# Patient Record
Sex: Female | Born: 1960 | Race: White | Hispanic: No | Marital: Married | Smoking: Former smoker
Health system: Southern US, Community
[De-identification: ages and names within clinical notes are randomized; demographics above are authoritative.]

## PROBLEM LIST (undated history)

## (undated) DIAGNOSIS — R609 Edema, unspecified: Secondary | ICD-10-CM

## (undated) DIAGNOSIS — E7800 Pure hypercholesterolemia, unspecified: Secondary | ICD-10-CM

## (undated) DIAGNOSIS — R0602 Shortness of breath: Secondary | ICD-10-CM

## (undated) DIAGNOSIS — Z9189 Other specified personal risk factors, not elsewhere classified: Secondary | ICD-10-CM

## (undated) DIAGNOSIS — E2839 Other primary ovarian failure: Secondary | ICD-10-CM

## (undated) DIAGNOSIS — M8580 Other specified disorders of bone density and structure, unspecified site: Secondary | ICD-10-CM

## (undated) DIAGNOSIS — N939 Abnormal uterine and vaginal bleeding, unspecified: Secondary | ICD-10-CM

## (undated) DIAGNOSIS — K5900 Constipation, unspecified: Secondary | ICD-10-CM

## (undated) DIAGNOSIS — D6851 Activated protein C resistance: Secondary | ICD-10-CM

## (undated) DIAGNOSIS — R112 Nausea with vomiting, unspecified: Secondary | ICD-10-CM

## (undated) DIAGNOSIS — J42 Unspecified chronic bronchitis: Secondary | ICD-10-CM

## (undated) DIAGNOSIS — Z95810 Presence of automatic (implantable) cardiac defibrillator: Secondary | ICD-10-CM

## (undated) DIAGNOSIS — Z8759 Personal history of other complications of pregnancy, childbirth and the puerperium: Secondary | ICD-10-CM

## (undated) DIAGNOSIS — G4730 Sleep apnea, unspecified: Secondary | ICD-10-CM

## (undated) DIAGNOSIS — M1990 Unspecified osteoarthritis, unspecified site: Secondary | ICD-10-CM

## (undated) DIAGNOSIS — G629 Polyneuropathy, unspecified: Secondary | ICD-10-CM

## (undated) DIAGNOSIS — K0889 Other specified disorders of teeth and supporting structures: Secondary | ICD-10-CM

## (undated) DIAGNOSIS — K219 Gastro-esophageal reflux disease without esophagitis: Secondary | ICD-10-CM

## (undated) DIAGNOSIS — Z86711 Personal history of pulmonary embolism: Secondary | ICD-10-CM

## (undated) DIAGNOSIS — N840 Polyp of corpus uteri: Secondary | ICD-10-CM

## (undated) DIAGNOSIS — R5383 Other fatigue: Secondary | ICD-10-CM

## (undated) DIAGNOSIS — I10 Essential (primary) hypertension: Secondary | ICD-10-CM

## (undated) DIAGNOSIS — Z794 Long term (current) use of insulin: Secondary | ICD-10-CM

## (undated) DIAGNOSIS — Z6841 Body Mass Index (BMI) 40.0 and over, adult: Secondary | ICD-10-CM

## (undated) DIAGNOSIS — M7989 Other specified soft tissue disorders: Secondary | ICD-10-CM

## (undated) DIAGNOSIS — E039 Hypothyroidism, unspecified: Secondary | ICD-10-CM

## (undated) DIAGNOSIS — I8290 Acute embolism and thrombosis of unspecified vein: Secondary | ICD-10-CM

## (undated) DIAGNOSIS — I509 Heart failure, unspecified: Secondary | ICD-10-CM

## (undated) DIAGNOSIS — K5730 Diverticulosis of large intestine without perforation or abscess without bleeding: Secondary | ICD-10-CM

## (undated) DIAGNOSIS — I447 Left bundle-branch block, unspecified: Secondary | ICD-10-CM

## (undated) DIAGNOSIS — I428 Other cardiomyopathies: Secondary | ICD-10-CM

## (undated) DIAGNOSIS — Z86718 Personal history of other venous thrombosis and embolism: Secondary | ICD-10-CM

## (undated) DIAGNOSIS — E119 Type 2 diabetes mellitus without complications: Secondary | ICD-10-CM

## (undated) DIAGNOSIS — E785 Hyperlipidemia, unspecified: Secondary | ICD-10-CM

## (undated) DIAGNOSIS — Z9889 Other specified postprocedural states: Secondary | ICD-10-CM

## (undated) DIAGNOSIS — E559 Vitamin D deficiency, unspecified: Secondary | ICD-10-CM

## (undated) DIAGNOSIS — G4733 Obstructive sleep apnea (adult) (pediatric): Secondary | ICD-10-CM

## (undated) DIAGNOSIS — K529 Noninfective gastroenteritis and colitis, unspecified: Secondary | ICD-10-CM

## (undated) HISTORY — DX: Other specified soft tissue disorders: M79.89

## (undated) HISTORY — DX: Obstructive sleep apnea (adult) (pediatric): G47.33

## (undated) HISTORY — DX: Other fatigue: R53.83

## (undated) HISTORY — DX: Sleep apnea, unspecified: G47.30

## (undated) HISTORY — DX: Other primary ovarian failure: E28.39

## (undated) HISTORY — DX: Heart failure, unspecified: I50.9

## (undated) HISTORY — DX: Essential (primary) hypertension: I10

## (undated) HISTORY — DX: Noninfective gastroenteritis and colitis, unspecified: K52.9

## (undated) HISTORY — DX: Shortness of breath: R06.02

## (undated) HISTORY — DX: Morbid (severe) obesity due to excess calories: E66.01

## (undated) HISTORY — DX: Other specified disorders of teeth and supporting structures: K08.89

## (undated) HISTORY — DX: Other cardiomyopathies: I42.8

## (undated) HISTORY — DX: Edema, unspecified: R60.9

## (undated) HISTORY — DX: Personal history of pulmonary embolism: Z86.711

## (undated) HISTORY — PX: CHOLECYSTECTOMY: SHX55

## (undated) HISTORY — DX: Other specified disorders of bone density and structure, unspecified site: M85.80

## (undated) HISTORY — DX: Constipation, unspecified: K59.00

## (undated) HISTORY — DX: Activated protein C resistance: D68.51

## (undated) HISTORY — DX: Hyperlipidemia, unspecified: E78.5

## (undated) HISTORY — DX: Vitamin D deficiency, unspecified: E55.9

---

## 1996-10-15 DIAGNOSIS — Z8759 Personal history of other complications of pregnancy, childbirth and the puerperium: Secondary | ICD-10-CM

## 1996-10-15 HISTORY — DX: Personal history of other complications of pregnancy, childbirth and the puerperium: Z87.59

## 1998-09-28 ENCOUNTER — Ambulatory Visit (HOSPITAL_COMMUNITY): Admission: RE | Admit: 1998-09-28 | Discharge: 1998-09-28 | Payer: Self-pay | Admitting: Family Medicine

## 2007-12-04 ENCOUNTER — Other Ambulatory Visit: Admission: RE | Admit: 2007-12-04 | Discharge: 2007-12-04 | Payer: Self-pay | Admitting: Gynecology

## 2007-12-06 HISTORY — PX: OTHER SURGICAL HISTORY: SHX169

## 2007-12-06 HISTORY — PX: TUBAL LIGATION: SHX77

## 2007-12-06 HISTORY — PX: DILATION AND CURETTAGE OF UTERUS: SHX78

## 2008-03-04 ENCOUNTER — Encounter: Payer: Self-pay | Admitting: Gynecology

## 2008-03-04 ENCOUNTER — Ambulatory Visit (HOSPITAL_BASED_OUTPATIENT_CLINIC_OR_DEPARTMENT_OTHER): Admission: RE | Admit: 2008-03-04 | Discharge: 2008-03-04 | Payer: Self-pay | Admitting: Gynecology

## 2010-06-15 ENCOUNTER — Ambulatory Visit: Payer: Self-pay | Admitting: Gynecology

## 2010-06-15 ENCOUNTER — Other Ambulatory Visit: Admission: RE | Admit: 2010-06-15 | Discharge: 2010-06-15 | Payer: Self-pay | Admitting: Gynecology

## 2011-02-27 NOTE — Op Note (Signed)
Monica Solis, Monica Solis               ACCOUNT NO.:  [id number]   MEDICAL RECORD NO.:  [id number]          PATIENT TYPE:  AMB   LOCATION:  [facility]                         FACILITY:  [facility]   PHYSICIAN:  Timothy P. Fontaine, M.D.DATE OF BIRTH:  [birth year]   DATE OF PROCEDURE:  03/04/2008  DATE OF DISCHARGE:                               OPERATIVE REPORT   PREOPERATIVE DIAGNOSES:  1. Endometrial polyp.  2. Menorrhagia.  3. Desires permanent sterilization.   POSTOPERATIVE DIAGNOSES:  1. Endometrial polyps.  2. Menorrhagia.  3. Desires permanent sterilization.  4. Abdominal adhesions, lysed.  5. Left probable hydrosalpinx.   PROCEDURE:  1. Hysteroscopy, endometrial polypectomy, dilatation and curettage.  2. Laparoscopic bilateral tubal sterilization, Falope ring technique.  3. Lysis of adhesions.   SURGEON:  Dr. Audie Box.   ANESTHETIC:  General.   ESTIMATED BLOOD LOSS:  Minimal.   COMPLICATIONS:  None.   SORBITOL DISCREPANCY:  Less than 100 mL.   SPECIMEN:  1. Endometrial polyp.  2. Endometrial curetting.   FINDINGS:  EUA, external BUS, vagina normal.  Cervix grossly normal.  Bimanual without gross masses.  Hysteroscopic with three separate  polyps, one from the fundal anterior right, two from the posterior lower  midline endometrium and to the left, all excised completely.  Post D&C  hysteroscopy was adequate, normal, no remaining pathology.  Good  distention, no evidence of perforation.  Laparoscopic, somewhat limited  by abdominal omental fat.  Anterior cul-de-sac grossly normal.  Posterior cul-de-sac normal.  Uterus normal size, shape and contour.  Right ovary grossly normal, free and mobile.  Left ovary grossly normal,  mildly adherent to the left pelvic sidewall.  Right fallopian tube  normal length, caliber, fimbriated ends.  Single Falope ring applied mid  tubal segment.  Left fallopian tube proximal section grossly normal,  distal section and fimbriated end not  visualized with some distal  dilatations suggestive of a hydrosalpinx, adherent firmly to the left  pelvic sidewall.  A single Falope ring applied to the mid to proximal  tubal segment.  No gross evidence of endometriosis.  A fibrous vascular  adhesive band from omentum to mid lower anterior abdominal wall sharply  lysed.  Upper abdominal exam grossly normal.  Liver smooth, no  abnormalities.  Gallbladder not visualized.  Appendix not visualized.   DESCRIPTION OF PROCEDURE:  The patient was taken to the operating room,  underwent general anesthesia and was placed in the low dorsal lithotomy  position.  She received an abdominoperineal vaginal preparation with  Betadine solution.  The bladder emptied with in-and-out Foley  catheterization.  EUA performed.  The patient draped in the usual  fashion.  The cervix was visualized with a speculum.  A single-tooth  tenaculum applied to the anterior lip of the cervix.  A paracervical  block using 1% lidocaine was placed.  The cervix was gently dilated to  admit the operative hysteroscope.  Hysteroscopy was performed with  findings noted above.  Using the right-angle resectoscopic loop, the  three separate polyps were excised and sent to pathology.  Subsequently,  a sharp curettage was performed.  Re-hysteroscopy  showed good  distention, no evidence of perforation and no remaining pathology.  The  patient's bladder was recatheterized to empty any remaining urine.  A  Hulka tenaculum was placed through the cervix for manipulation during  the laparoscopic portion of the procedure.  The surgeon and assistant  regloved and prepared for the laparoscopic portion.  A transverse  infraumbilical incision was made using the 10-mm OptiVu direct entry  trocar.  The abdomen was directly entered without difficulty under  direct visualization.  A suprapubic Falope ring applying port was then  placed under direct visualization after transillumination for the   vessels with no difficulty.  Examination of pelvic organs and upper  abdominal exam was carried out with findings noted above.  The right  fallopian tube was traced from its insertion to its fimbriated end.  A  mid tubal segment was grasped and placed within the Falope ring applier  and a single Falope ring applied.  A good knuckle of tube was within the  ring and manipulation assured secure placement.  Attempts to elevate the  left adnexa completely were unsuccessful due to firm adhesion of the  distal portion of the fallopian tube and a portion of the left ovary.  The ovary was able to be mobilized to visualize most of the ovary  without pathology noted, but the distal fimbriated portion could not be  visualized and there was some dilatation of the distal portion to  suggest a hydrosalpinx.  The proximal to mid tubal segments were  identified and was delivered into the Falope ring applier and a single  Falope ring was applied.  A good knuckle of tube was within the ring and  manipulation assured secure placement.  A thin vascular adhesive band  from the omentum to the lower mid anterior abdominal wall was noted and  at this point it was bipolarized and incised without difficulty.  At  this point, the Falope ring sheath was removed.  The gas slowly allowed  to escape.  Adequate hemostasis visualized at the port site.  The  infraumbilical 10 mm port was then backed out under direct visualization  showing adequate hemostasis.  No evidence of hernia formation.  Both  skin incisions were injected using 0.25% Marcaine.  A 0 Vicryl  subcutaneous fascial stitch was placed infraumbilically and both skin  incisions were closed using 4-0 plain suture and interrupted cuticular  stitch.  Sterile dressings were applied.  The Hulka tenaculum was  removed.  The patient placed in a supine position, awakened without  difficulty and taken to the recovery room in good condition having  tolerated the  procedure well.      Timothy P. Fontaine, M.D.  Electronically Signed     TPF/MEDQ  D:  03/04/2008  T:  03/04/2008  Job:  [id number]

## 2011-02-27 NOTE — H&P (Signed)
Monica Solis, Monica Solis               ACCOUNT NO.:  [id number]   MEDICAL RECORD NO.:  [id number]          PATIENT TYPE:  AMB   LOCATION:  [facility]                         FACILITY:  [facility]   PHYSICIAN:  Timothy P. Fontaine, M.D.DATE OF BIRTH:  1960/12/04   DATE OF ADMISSION:  03/04/2008  DATE OF DISCHARGE:                              HISTORY & PHYSICAL   CHIEF COMPLAINT:  Endometrial polyp.  Desires permanent sterilization.   HISTORY OF PRESENT ILLNESS:  A 50 year old G1 P0 AB 1 female initially  had ultrasound for pelvic surveillance.  Had a thickened endometrium.  Subsequently had a sonohystogram which showed 2 endometrial polyps.  The  patient's menses are regular, heavy for the first several days and then  lighter thereafter.  The patient also desires permanent sterilization  and was counseled for options to include reversible versus permanent  methods,  including IUD, all of which she declines and wants to proceed  with tubal sterilization.   PAST MEDICAL HISTORY:  Significant for hypertension and a history of DVT  x2 in 2000, although it sounds superficial by history, where she did not  receive any anticoagulation, and these spontaneously resolved.   PAST SURGICAL HISTORY:  None.   CURRENT MEDICATIONS:  Triamterene/hydrochlorothiazide, baby aspirin  daily, Nexium.   ALLERGIES:  No reported medications.   REVIEW OF SYSTEMS:  Noncontributory.   FAMILY HISTORY:  Noncontributory.   SOCIAL HISTORY:  Noncontributory.   PHYSICAL EXAMINATION:  VITAL SIGNS:  Afebrile.  Vital signs are stable.  HEENT:  Normal.  LUNGS:  Clear.  CARDIAC:  Regular rate.  No rubs, murmurs or gallops.  ABDOMINAL:  Exam  is benign.  Obese.  No gross masses or abnormalities.  PELVIC:  External BUS, vagina normal.  Cervix grossly normal.  Uterus  normal size, midline, mobile, nontender.  Adnexa without masses or  tenderness.   ASSESSMENT:  A 50 year old G1 P0 AB 1 female, heavy periods, ultrasound  suggestive of a thickened endometrium.  Subsequent sonohystogram showed  2 clear polyps for hysteroscopic resection.  The patient also desires  permanent sterilization and requested to have this done at the same  time.  I reviewed what is involved with both procedures to include the  hysteroscopy, D&C, use of the resectoscope as well as the laparoscopic  tubal sterilization, including instrumentation, trocar placement,  insufflation, use of Falope rings, bipolar cautery and excisional  processes such as a harmonic scalpel.  The absolute irreversible  sterility and permanency of the procedure was reviewed with her as well  as the risk of failure, and she understands and accepts this.  The risks  of bleeding, transfusion, infection, incisional complications, opening  and draining of incisions, closure by secondary intention, long-term  issues, hernia formation, keloid scar formation, were all discussed,  understood and accepted.  The risks of inadvertent injury to internal  organs, including bowel, bladder, ureters, vessels and nerves  necessitating major exploratory reparative surgeries, future reparative  surgeries, ostomy formation, bowel resection, were all discussed,  understood and accepted.  Again, alternatives for reversible forms of  contraception such as Mirena IUD were reviewed, and  the patient has  thought of these and wants to proceed with sterilization.  I reviewed  the hysteroscopic portion with her, and again I reviewed what is  involved with the procedure, including the above risks of damage to  internal organs, risks of uterine perforation during the procedure as  well as the risk of distended media absorption leading to metabolic  complications such as coma, seizures were discussed, understood and  accepted.  The patient's questions were answered to her satisfaction,  and she is ready to proceed with surgery.      Timothy P. Fontaine, M.D.  Electronically  Signed     TPF/MEDQ  D:  03/03/2008  T:  03/03/2008  Job:  [id number]

## 2011-07-10 ENCOUNTER — Encounter: Payer: Self-pay | Admitting: Gynecology

## 2011-07-10 ENCOUNTER — Ambulatory Visit (INDEPENDENT_AMBULATORY_CARE_PROVIDER_SITE_OTHER): Payer: 59 | Admitting: Gynecology

## 2011-07-10 ENCOUNTER — Other Ambulatory Visit (HOSPITAL_COMMUNITY)
Admission: RE | Admit: 2011-07-10 | Discharge: 2011-07-10 | Disposition: A | Discharge status: Home / Self Care | Payer: 59 | Source: Ambulatory Visit | Attending: Gynecology | Admitting: Gynecology

## 2011-07-10 VITALS — BP 130/74 | Ht 69.0 in | Wt 304.0 lb

## 2011-07-10 DIAGNOSIS — I454 Nonspecific intraventricular block: Secondary | ICD-10-CM | POA: Insufficient documentation

## 2011-07-10 DIAGNOSIS — I82409 Acute embolism and thrombosis of unspecified deep veins of unspecified lower extremity: Secondary | ICD-10-CM | POA: Insufficient documentation

## 2011-07-10 DIAGNOSIS — Z01419 Encounter for gynecological examination (general) (routine) without abnormal findings: Secondary | ICD-10-CM | POA: Insufficient documentation

## 2011-07-10 DIAGNOSIS — E669 Obesity, unspecified: Secondary | ICD-10-CM | POA: Insufficient documentation

## 2011-07-10 DIAGNOSIS — R21 Rash and other nonspecific skin eruption: Secondary | ICD-10-CM

## 2011-07-10 NOTE — Patient Instructions (Signed)
Follow up in one year for gyn exam

## 2011-07-10 NOTE — Progress Notes (Signed)
Monica Solis 09-02-1961 [id number]        50 y.o.  for annual exam.  Doing well from a gynecologic standpoint. She recently was diagnosed with a DVT although she said it was more superficial and had been on Coumadin but this has been discontinued. Her menses have been heavy previously but she's notes now they are lighter and regular.  Past medical history,surgical history, medications, allergies, family history and social history were all reviewed and documented in the EPIC chart. ROS:  Was performed and pertinent positives and negatives are included in the history.  Exam: chaperone present Filed Vitals:   07/10/11 0951  BP: 130/74   General appearance  Normal Skin grossly normal Head/Neck normal with no cervical or supraclavicular adenopathy thyroid normal Lungs  clear Cardiac RR, without RMG Abdominal  soft, nontender, without masses, organomegaly or hernia Breasts  examined lying and sitting without masses, retractions, discharge or axillary adenopathy.  Rash under both breasts, scaly in nature Pelvic  Ext/BUS/vagina  normal   Cervix  normal  Pap done  Uterus  midline, normal size, shape and contour, midline and mobile nontender   Adnexa  Without masses or tenderness    Anus and perineum  normal   Rectovaginal  normal sphincter tone without palpated masses or tenderness.    Assessment/Plan:  50 y.o. female for annual exam.    1. Breast rash. Patient is being seen by a dermatologist. She's been treated with a number of creams. The rash does not appear to be fungal but more psoriasis-like in appearance. She will continue to follow up with the dermatologist. 2. Menses. Patient's periods have become lighter and regular. She had been on Coumadin for her DVT. She does have a history of an endometrial polyp in the past. As long as her menses are regular and lighter we'll continue to follow. 3. Health maintenance. She has recently been diagnosed with Leiden factor V deficiency. She is on  aspirin. She'll continue to follow up with her internist in reference to this. I did suggest that she have her family members screened. Self breast exams on a monthly basis discussed encouraged. I gave her a request slip to have a mammogram scheduled at [facility] and she can go and do this. No blood work was done today was all done through her primary to follow her for diabetes and hypertension. She recently is being evaluated for hypothyroidism and apparently had a low vitamin D also and is following up on this. Assuming she continues well from a gynecologic standpoint she'll see [place] in a year sooner as needed     Dara Lords MD, 10:40 AM 07/10/2011

## 2011-07-17 ENCOUNTER — Encounter: Payer: Self-pay | Admitting: Gynecology

## 2011-10-16 HISTORY — PX: COLONOSCOPY: SHX174

## 2012-07-25 ENCOUNTER — Encounter: Payer: Self-pay | Admitting: Gynecology

## 2012-07-25 ENCOUNTER — Ambulatory Visit (INDEPENDENT_AMBULATORY_CARE_PROVIDER_SITE_OTHER): Payer: 59 | Admitting: Gynecology

## 2012-07-25 VITALS — BP 118/72 | Ht 68.25 in | Wt 310.0 lb

## 2012-07-25 DIAGNOSIS — Z01419 Encounter for gynecological examination (general) (routine) without abnormal findings: Secondary | ICD-10-CM

## 2012-07-25 DIAGNOSIS — R21 Rash and other nonspecific skin eruption: Secondary | ICD-10-CM

## 2012-07-25 MED ORDER — NYSTATIN-TRIAMCINOLONE 100000-0.1 UNIT/GM-% EX OINT: TOPICAL_OINTMENT | Freq: Two times a day (BID) | CUTANEOUS | Status: DC

## 2012-07-25 NOTE — Progress Notes (Signed)
Cherokee Limehouse August 22, 1961 [id number]        51 y.o.  G1P0010 for annual exam.  Doing well without complaints.  Past medical history,surgical history, medications, allergies, family history and social history were all reviewed and documented in the EPIC chart. ROS:  Was performed and pertinent positives and negatives are included in the history.  Exam: Sherrilyn Rist assistant Filed Vitals:   07/25/12 1400  BP: 118/72  Height: 5' 8.25" (1.734 m)  Weight: 310 lb (140.615 kg)   General appearance  Normal Skin grossly normal Head/Neck normal with no cervical or supraclavicular adenopathy thyroid normal Lungs  clear Cardiac RR, without RMG Abdominal  soft, nontender, without masses, organomegaly or hernia Breasts  examined lying and sitting without masses, retractions, discharge or axillary adenopathy.  Rash under both breasts consistent with fungal Pelvic  Ext/BUS/vagina  normal   Cervix  normal   Uterus  axial, grossly normal size, shape and contour, midline and mobile nontender   Adnexa  Without masses or tenderness    Anus and perineum  normal   Rectovaginal  normal sphincter tone without palpated masses or tenderness.    Assessment/Plan:  51 y.o. G28P0010 female for annual exam, regular menses tubal sterilization.   1. Continues with regular menses. Is having some hot flushes. We'll continue to monitor at present. We'll follow up if significant menstrual irregularity or significant menopausal symptoms. 2. Mammogram. Patient overdo and I wrote her a request slip for [facility] and she agrees to schedule. SBE monthly reviewed. 3. Skin rash. Under both breasts. Has been a chronic issue. Has seen dermatology and has been given creams which do not seem to help. We'll try Mytrex twice a day. Follow up with dermatologist it continues. 4. Pap smear. The Pap smear done today. Pap 2012 normal. No history of abnormal Pap smears before. We'll plan every 3- to 5 year screening. 5. Health  maintenance. No blood work done as it is all done through her primary physician's office who sees her for her medical issues. Follow up one year, sooner as needed.    Dara Lords MD, 2:32 PM 07/25/2012

## 2012-07-25 NOTE — Patient Instructions (Addendum)
Try Mytrex cream under both breasts twice daily. Follow up in one year

## 2012-07-26 LAB — URINALYSIS W MICROSCOPIC + REFLEX CULTURE
Casts: NONE SEEN
Crystals: NONE SEEN
Glucose, UA: NEGATIVE mg/dL
Hgb urine dipstick: NEGATIVE
Leukocytes, UA: NEGATIVE
Nitrite: NEGATIVE
Specific Gravity, Urine: 1.014 (ref 1.005–1.030)
Squamous Epithelial / LPF: NONE SEEN
pH: 6 (ref 5.0–8.0)

## 2012-07-30 ENCOUNTER — Encounter: Payer: Self-pay | Admitting: Gynecology

## 2013-05-07 DIAGNOSIS — M25561 Pain in right knee: Secondary | ICD-10-CM | POA: Insufficient documentation

## 2013-07-16 DIAGNOSIS — S83419A Sprain of medial collateral ligament of unspecified knee, initial encounter: Secondary | ICD-10-CM | POA: Insufficient documentation

## 2013-07-27 ENCOUNTER — Encounter: Payer: Self-pay | Admitting: Gynecology

## 2013-07-27 ENCOUNTER — Ambulatory Visit (INDEPENDENT_AMBULATORY_CARE_PROVIDER_SITE_OTHER): Payer: 59 | Admitting: Gynecology

## 2013-07-27 VITALS — BP 130/88 | Ht 68.25 in | Wt 233.0 lb

## 2013-07-27 DIAGNOSIS — Z01419 Encounter for gynecological examination (general) (routine) without abnormal findings: Secondary | ICD-10-CM

## 2013-07-27 DIAGNOSIS — N951 Menopausal and female climacteric states: Secondary | ICD-10-CM

## 2013-07-27 DIAGNOSIS — R21 Rash and other nonspecific skin eruption: Secondary | ICD-10-CM

## 2013-07-27 MED ORDER — VENLAFAXINE HCL ER 75 MG PO CP24: 75.0000 mg | ORAL_CAPSULE | Freq: Every day | ORAL | Status: DC

## 2013-07-27 MED ORDER — BETAMETHASONE DIPROPIONATE AUG 0.05 % EX CREA: TOPICAL_CREAM | Freq: Two times a day (BID) | CUTANEOUS | Status: DC

## 2013-07-27 NOTE — Patient Instructions (Signed)
Start on Effexor and see if this does not help with her hot flushes and mood swings. Call me if you have any issues with this. Start the steroid cream for the rash under the breast. If the rash we continue I recommended followup with the dermatologist. Followup for screening mammogram. Followup in one year, sooner as needed.

## 2013-07-27 NOTE — Progress Notes (Signed)
Monica Solis Sep 13, 1961 [id number]        52 y.o.  G1P0010 for annual exam.  Several issues noted below.  Past medical history,surgical history, medications, allergies, family history and social history were all reviewed and documented in the EPIC chart.  ROS:  Performed and pertinent positives and negatives are included in the history, assessment and plan .  Exam: Biomedical scientist Filed Vitals:   07/27/13 0907  BP: 130/88  Height: 5' 8.25" (1.734 m)  Weight: 233 lb (105.688 kg)   General appearance  Normal Skin grossly normal Head/Neck normal with no cervical or supraclavicular adenopathy thyroid normal Lungs  clear Cardiac RR, without RMG Abdominal  soft, nontender, without masses, organomegaly or hernia Breasts  examined lying and sitting without masses, retractions, discharge or axillary adenopathy. Pelvic  Ext/BUS/vagina  normal  Cervix  normal  Uterus  anteverted, grossly normal size, midline and mobile nontender   Adnexa  Without gross masses or tenderness    Anus and perineum  normal   Rectovaginal  normal sphincter tone without palpated masses or tenderness.    Assessment/Plan:  52 y.o. G95P0010 female for annual exam, regular menses, tubal sterilization.   1. Menopausal symptoms. Patient starting to have hot flushes and night sweats as well as emotional swings. Menses still continue monthly without significant irregular bleeding. History of DVT and Leiden factor V mutation. Options for management include observation, OTC products such as soy, HRT and pharmacologic nonhormonal such as Effexor. I reviewed the whole issue of HRT with her to include the WHI study with increased risk of stroke, heart attack, DVT and breast cancer. The ACOG and NAMS statements for lowest dose for the shortest period of time reviewed. Transdermal versus oral first-pass effect benefit discussed. Her history of DVT and Leiden factor V factor certainly considered a relative contraindication to HRT.  Even with transdermal estrogen would have to consider increased risk for thrombosis. After lengthy discussion we both agree on a trial of Effexor XR 75 mg daily. Patient will follow and call if not with significant results and we'll bump to the 150 mg. Side effect profile reviewed with the patient. 2. Skin rash under both breasts. Tried Mycolog but did not help. Did see dermatologist previously who gave her steroid cream which seemed to help. Diprolene 5% cream prescribed to use as needed. If rash persists recommended followup with the dermatologist. 3. Mammography being scheduled now. Request slip given to have done at [facility]. SBE monthly reviewed. 4. Colonoscopy 2013. Repeat at their recommended interval. 5. Pap smear 2012. No Pap smear done today. No history of abnormal Pap smears previously. Plan repeat Pap smear next year at 3 year interval. 6. Health maintenance. No blood work done as this is all done through her medical doctors offices who follows her for her medical issues. Followup one year, sooner as needed.  Note: This document was prepared with digital dictation and possible smart phrase technology. Any transcriptional errors that result from this process are unintentional.   Dara Lords MD, 9:40 AM 07/27/2013

## 2013-08-06 ENCOUNTER — Encounter: Payer: Self-pay | Admitting: Gynecology

## 2013-08-14 ENCOUNTER — Encounter: Payer: Self-pay | Admitting: Gynecology

## 2013-10-06 ENCOUNTER — Other Ambulatory Visit: Payer: Self-pay | Admitting: *Deleted

## 2013-10-06 MED ORDER — VENLAFAXINE HCL ER 75 MG PO CP24: 75.0000 mg | ORAL_CAPSULE | Freq: Every day | ORAL | Status: DC

## 2013-12-14 DIAGNOSIS — E785 Hyperlipidemia, unspecified: Secondary | ICD-10-CM | POA: Insufficient documentation

## 2013-12-14 DIAGNOSIS — E1129 Type 2 diabetes mellitus with other diabetic kidney complication: Secondary | ICD-10-CM | POA: Insufficient documentation

## 2013-12-14 DIAGNOSIS — Z049 Encounter for examination and observation for unspecified reason: Secondary | ICD-10-CM | POA: Insufficient documentation

## 2013-12-14 DIAGNOSIS — K219 Gastro-esophageal reflux disease without esophagitis: Secondary | ICD-10-CM | POA: Insufficient documentation

## 2013-12-14 DIAGNOSIS — Z719 Counseling, unspecified: Secondary | ICD-10-CM | POA: Insufficient documentation

## 2013-12-14 DIAGNOSIS — K5730 Diverticulosis of large intestine without perforation or abscess without bleeding: Secondary | ICD-10-CM | POA: Insufficient documentation

## 2013-12-14 DIAGNOSIS — E119 Type 2 diabetes mellitus without complications: Secondary | ICD-10-CM | POA: Insufficient documentation

## 2013-12-14 DIAGNOSIS — J309 Allergic rhinitis, unspecified: Secondary | ICD-10-CM | POA: Insufficient documentation

## 2013-12-14 DIAGNOSIS — I10 Essential (primary) hypertension: Secondary | ICD-10-CM | POA: Insufficient documentation

## 2013-12-14 DIAGNOSIS — E559 Vitamin D deficiency, unspecified: Secondary | ICD-10-CM | POA: Insufficient documentation

## 2013-12-15 DIAGNOSIS — M722 Plantar fascial fibromatosis: Secondary | ICD-10-CM | POA: Insufficient documentation

## 2013-12-15 DIAGNOSIS — I8000 Phlebitis and thrombophlebitis of superficial vessels of unspecified lower extremity: Secondary | ICD-10-CM | POA: Insufficient documentation

## 2013-12-15 DIAGNOSIS — D6859 Other primary thrombophilia: Secondary | ICD-10-CM | POA: Insufficient documentation

## 2014-05-09 DIAGNOSIS — I82819 Embolism and thrombosis of superficial veins of unspecified lower extremities: Secondary | ICD-10-CM | POA: Insufficient documentation

## 2014-05-09 DIAGNOSIS — R809 Proteinuria, unspecified: Secondary | ICD-10-CM | POA: Insufficient documentation

## 2014-07-28 ENCOUNTER — Encounter: Payer: 59 | Admitting: Gynecology

## 2014-08-10 ENCOUNTER — Ambulatory Visit (INDEPENDENT_AMBULATORY_CARE_PROVIDER_SITE_OTHER): Payer: 59 | Admitting: Gynecology

## 2014-08-10 ENCOUNTER — Other Ambulatory Visit (HOSPITAL_COMMUNITY)
Admission: RE | Admit: 2014-08-10 | Discharge: 2014-08-10 | Disposition: A | Discharge status: Home / Self Care | Payer: 59 | Source: Ambulatory Visit | Attending: Gynecology | Admitting: Gynecology

## 2014-08-10 ENCOUNTER — Encounter: Payer: Self-pay | Admitting: Gynecology

## 2014-08-10 VITALS — BP 116/74 | Ht 69.0 in | Wt 321.0 lb

## 2014-08-10 DIAGNOSIS — Z1151 Encounter for screening for human papillomavirus (HPV): Secondary | ICD-10-CM | POA: Diagnosis present

## 2014-08-10 DIAGNOSIS — Z01419 Encounter for gynecological examination (general) (routine) without abnormal findings: Secondary | ICD-10-CM

## 2014-08-10 DIAGNOSIS — Z01411 Encounter for gynecological examination (general) (routine) with abnormal findings: Secondary | ICD-10-CM | POA: Diagnosis present

## 2014-08-10 DIAGNOSIS — R21 Rash and other nonspecific skin eruption: Secondary | ICD-10-CM

## 2014-08-10 DIAGNOSIS — N951 Menopausal and female climacteric states: Secondary | ICD-10-CM

## 2014-08-10 DIAGNOSIS — N926 Irregular menstruation, unspecified: Secondary | ICD-10-CM

## 2014-08-10 MED ORDER — CLOBETASOL PROPIONATE 0.05 % EX CREA: TOPICAL_CREAM | CUTANEOUS | Status: DC

## 2014-08-10 NOTE — Patient Instructions (Signed)
Apply the steroid cream under both breasts as needed.  Call if your menopausal symptoms worsen and we want to consider medication.  You may obtain a copy of any labs that were done today by logging onto MyChart as outlined in the instructions provided with your AVS (after visit summary). The office will not call with normal lab results but certainly if there are any significant abnormalities then we will contact you.   Health Maintenance, Female A healthy lifestyle and preventative care can promote health and wellness.  Maintain regular health, dental, and eye exams.  Eat a healthy diet. Foods like vegetables, fruits, whole grains, low-fat dairy products, and lean protein foods contain the nutrients you need without too many calories. Decrease your intake of foods high in solid fats, added sugars, and salt. Get information about a proper diet from your caregiver, if necessary.  Regular physical exercise is one of the most important things you can do for your health. Most adults should get at least 150 minutes of moderate-intensity exercise (any activity that increases your heart rate and causes you to sweat) each week. In addition, most adults need muscle-strengthening exercises on 2 or more days a week.   Maintain a healthy weight. The body mass index (BMI) is a screening tool to identify possible weight problems. It provides an estimate of body fat based on height and weight. Your caregiver can help determine your BMI, and can help you achieve or maintain a healthy weight. For adults 20 years and older:  A BMI below 18.5 is considered underweight.  A BMI of 18.5 to 24.9 is normal.  A BMI of 25 to 29.9 is considered overweight.  A BMI of 30 and above is considered obese.  Maintain normal blood lipids and cholesterol by exercising and minimizing your intake of saturated fat. Eat a balanced diet with plenty of fruits and vegetables. Blood tests for lipids and cholesterol should begin at age  27 and be repeated every 5 years. If your lipid or cholesterol levels are high, you are over 50, or you are a high risk for heart disease, you may need your cholesterol levels checked more frequently.Ongoing high lipid and cholesterol levels should be treated with medicines if diet and exercise are not effective.  If you smoke, find out from your caregiver how to quit. If you do not use tobacco, do not start.  Lung cancer screening is recommended for adults aged 36 80 years who are at high risk for developing lung cancer because of a history of smoking. Yearly low-dose computed tomography (CT) is recommended for people who have at least a 30-pack-year history of smoking and are a current smoker or have quit within the past 15 years. A pack year of smoking is smoking an average of 1 pack of cigarettes a day for 1 year (for example: 1 pack a day for 30 years or 2 packs a day for 15 years). Yearly screening should continue until the smoker has stopped smoking for at least 15 years. Yearly screening should also be stopped for people who develop a health problem that would prevent them from having lung cancer treatment.  If you are pregnant, do not drink alcohol. If you are breastfeeding, be very cautious about drinking alcohol. If you are not pregnant and choose to drink alcohol, do not exceed 1 drink per day. One drink is considered to be 12 ounces (355 mL) of beer, 5 ounces (148 mL) of wine, or 1.5 ounces (44 mL) of liquor.  Avoid use of street drugs. Do not share needles with anyone. Ask for help if you need support or instructions about stopping the use of drugs.  High blood pressure causes heart disease and increases the risk of stroke. Blood pressure should be checked at least every 1 to 2 years. Ongoing high blood pressure should be treated with medicines, if weight loss and exercise are not effective.  If you are 36 to 52 years old, ask your caregiver if you should take aspirin to prevent  strokes.  Diabetes screening involves taking a blood sample to check your fasting blood sugar level. This should be done once every 3 years, after age 30, if you are within normal weight and without risk factors for diabetes. Testing should be considered at a younger age or be carried out more frequently if you are overweight and have at least 1 risk factor for diabetes.  Breast cancer screening is essential preventative care for women. You should practice "breast self-awareness." This means understanding the normal appearance and feel of your breasts and may include breast self-examination. Any changes detected, no matter how small, should be reported to a caregiver. Women in their 63s and 30s should have a clinical breast exam (CBE) by a caregiver as part of a regular health exam every 1 to 3 years. After age 46, women should have a CBE every year. Starting at age 41, women should consider having a mammogram (breast X-ray) every year. Women who have a family history of breast cancer should talk to their caregiver about genetic screening. Women at a high risk of breast cancer should talk to their caregiver about having an MRI and a mammogram every year.  Breast cancer gene (BRCA)-related cancer risk assessment is recommended for women who have family members with BRCA-related cancers. BRCA-related cancers include breast, ovarian, tubal, and peritoneal cancers. Having family members with these cancers may be associated with an increased risk for harmful changes (mutations) in the breast cancer genes BRCA1 and BRCA2. Results of the assessment will determine the need for genetic counseling and BRCA1 and BRCA2 testing.  The Pap test is a screening test for cervical cancer. Women should have a Pap test starting at age 64. Between ages 70 and 95, Pap tests should be repeated every 2 years. Beginning at age 71, you should have a Pap test every 3 years as long as the past 3 Pap tests have been normal. If you had a  hysterectomy for a problem that was not cancer or a condition that could lead to cancer, then you no longer need Pap tests. If you are between ages 56 and 49, and you have had normal Pap tests going back 10 years, you no longer need Pap tests. If you have had past treatment for cervical cancer or a condition that could lead to cancer, you need Pap tests and screening for cancer for at least 20 years after your treatment. If Pap tests have been discontinued, risk factors (such as a new sexual partner) need to be reassessed to determine if screening should be resumed. Some women have medical problems that increase the chance of getting cervical cancer. In these cases, your caregiver may recommend more frequent screening and Pap tests.  The human papillomavirus (HPV) test is an additional test that may be used for cervical cancer screening. The HPV test looks for the virus that can cause the cell changes on the cervix. The cells collected during the Pap test can be tested for HPV. The  HPV test could be used to screen women aged 60 years and older, and should be used in women of any age who have unclear Pap test results. After the age of 62, women should have HPV testing at the same frequency as a Pap test.  Colorectal cancer can be detected and often prevented. Most routine colorectal cancer screening begins at the age of 55 and continues through age 35. However, your caregiver may recommend screening at an earlier age if you have risk factors for colon cancer. On a yearly basis, your caregiver may provide home test kits to check for hidden blood in the stool. Use of a small camera at the end of a tube, to directly examine the colon (sigmoidoscopy or colonoscopy), can detect the earliest forms of colorectal cancer. Talk to your caregiver about this at age 74, when routine screening begins. Direct examination of the colon should be repeated every 5 to 10 years through age 34, unless early forms of pre-cancerous  polyps or small growths are found.  Hepatitis C blood testing is recommended for all people born from [birth year] through 1964 and any individual with known risks for hepatitis C.  Practice safe sex. Use condoms and avoid high-risk sexual practices to reduce the spread of sexually transmitted infections (STIs). Sexually active women aged 39 and younger should be checked for Chlamydia, which is a common sexually transmitted infection. Older women with new or multiple partners should also be tested for Chlamydia. Testing for other STIs is recommended if you are sexually active and at increased risk.  Osteoporosis is a disease in which the bones lose minerals and strength with aging. This can result in serious bone fractures. The risk of osteoporosis can be identified using a bone density scan. Women ages 53 and over and women at risk for fractures or osteoporosis should discuss screening with their caregivers. Ask your caregiver whether you should be taking a calcium supplement or vitamin D to reduce the rate of osteoporosis.  Menopause can be associated with physical symptoms and risks. Hormone replacement therapy is available to decrease symptoms and risks. You should talk to your caregiver about whether hormone replacement therapy is right for you.  Use sunscreen. Apply sunscreen liberally and repeatedly throughout the day. You should seek shade when your shadow is shorter than you. Protect yourself by wearing long sleeves, pants, a wide-brimmed hat, and sunglasses year round, whenever you are outdoors.  Notify your caregiver of new moles or changes in moles, especially if there is a change in shape or color. Also notify your caregiver if a mole is larger than the size of a pencil eraser.  Stay current with your immunizations. Document Released: 04/16/2011 Document Revised: 01/26/2013 Document Reviewed: 04/16/2011 [facility] Patient Information 2014 [place].

## 2014-08-10 NOTE — Progress Notes (Signed)
Monica Solis Oct 19, 1960 [id number]        53 y.o.  G1P0010 for annual exam.  Several issues noted below.  Past medical history,surgical history, problem list, medications, allergies, family history and social history were all reviewed and documented as reviewed in the EPIC chart.  ROS:  12 system ROS performed with pertinent positives and negatives included in the history, assessment and plan.   Additional significant findings :  none   Exam: Monica Solis Vitals:   08/10/14 0846  BP: 116/74  Height: 5\' 9"  (1.753 m)  Weight: 321 lb (145.605 kg)   General appearance:  Normal affect, orientation and appearance. Skin: Grossly normal HEENT: Without gross lesions.  No cervical or supraclavicular adenopathy. Thyroid normal.  Lungs:  Clear without wheezing, rales or rhonchi Cardiac: RR, without RMG Abdominal:  Soft, nontender, without masses, guarding, rebound, organomegaly or hernia Breasts:  Examined lying and sitting without masses, retractions, discharge or axillary adenopathy.  Rash under both breasts consistent with fungal. Pelvic:  Ext/BUS/vagina normal  Cervix normal. Pap/HPV  Uterus grossly normal size, midline and mobile nontender. Exam limited by abdominal girth .  Adnexa  Without gross masses or tenderness    Anus and perineum  Normal   Rectovaginal  Normal sphincter tone without palpated masses or tenderness.    Assessment/Plan:  52 y.o. G73P0010 female for annual exam irregular menses, tubal sterilization.   1. Irregular menses/menopausal symptoms. Patient continues to have hot flushes and night sweats. Menses have become more irregular where she skipped up to 9 months but now has had regular menses of the last 4-5 months. Tried Effexor last year but had unusual dreams and ultimately stopped it. History of Leiden factor V and DVT. Reviewed alternatives to include possible other psychoactive drugs such as Celexa or Neurontin. At this point patient just wants to keep  track of her symptoms and will call if they become more significant. Will keep menstrual calendar as long as regular but less frequent menses and will follow. If she has prolonged or atypical bleeding or goes more than one year without menses and she'll follow up for evaluation. 2. Rash under both breasts consistent with fungal. Has tried a number of different creams to include evaluation and treatment by dermatology. Transiently improves but recurs. We'll try a stronger steroid with Temovate 0.05% cream. Will apply nightly for 2 weeks and then taper. Use intermittently if works. Otherwise consider reevaluation by dermatology. 3. Pap smear 2012. Pap/HPV today. No history of significant abnormal Pap smears previously. Plan repeat Pap smear in 3-5 year interval assuming this Pap smear is normal per current screening guidelines. 4. Mammography 07/2013. Patients in the process of arranging at [place] with annual mammography. SBE monthly reviewed. 5. Colonoscopy 2013. Repeat at their recommended interval. 6. Health maintenance. No routine blood work done as she has this done through her other 36 office who follows her for her medical issues. Follow up in one year, sooner as needed.     Monica Auerbach MD, 9:11 AM 08/10/2014

## 2014-08-10 NOTE — Addendum Note (Signed)
Addended by: Nelva Nay on: 08/10/2014 10:49 AM   Modules accepted: Orders

## 2014-08-11 LAB — URINALYSIS W MICROSCOPIC + REFLEX CULTURE
Bacteria, UA: NONE SEEN
Bilirubin Urine: NEGATIVE
CASTS: NONE SEEN
CRYSTALS: NONE SEEN
GLUCOSE, UA: NEGATIVE mg/dL
Hgb urine dipstick: NEGATIVE
Ketones, ur: NEGATIVE mg/dL
LEUKOCYTES UA: NEGATIVE
Nitrite: NEGATIVE
PH: 5.5 (ref 5.0–8.0)
Protein, ur: NEGATIVE mg/dL
SPECIFIC GRAVITY, URINE: 1.02 (ref 1.005–1.030)
Squamous Epithelial / LPF: NONE SEEN
Urobilinogen, UA: 0.2 mg/dL (ref 0.0–1.0)

## 2014-08-12 LAB — CYTOLOGY - PAP

## 2014-08-16 ENCOUNTER — Encounter: Payer: Self-pay | Admitting: Gynecology

## 2014-08-16 ENCOUNTER — Other Ambulatory Visit: Payer: Self-pay | Admitting: Gynecology

## 2014-08-16 DIAGNOSIS — R87618 Other abnormal cytological findings on specimens from cervix uteri: Secondary | ICD-10-CM

## 2014-08-20 ENCOUNTER — Encounter: Payer: Self-pay | Admitting: Gynecology

## 2014-08-23 ENCOUNTER — Other Ambulatory Visit: Payer: Self-pay | Admitting: Gynecology

## 2014-08-23 DIAGNOSIS — R87618 Other abnormal cytological findings on specimens from cervix uteri: Secondary | ICD-10-CM

## 2014-09-13 ENCOUNTER — Other Ambulatory Visit: Payer: 59

## 2014-09-13 ENCOUNTER — Ambulatory Visit (INDEPENDENT_AMBULATORY_CARE_PROVIDER_SITE_OTHER): Payer: 59

## 2014-09-13 ENCOUNTER — Ambulatory Visit (INDEPENDENT_AMBULATORY_CARE_PROVIDER_SITE_OTHER): Payer: 59 | Admitting: Gynecology

## 2014-09-13 ENCOUNTER — Encounter: Payer: Self-pay | Admitting: Gynecology

## 2014-09-13 ENCOUNTER — Ambulatory Visit: Payer: 59 | Admitting: Gynecology

## 2014-09-13 ENCOUNTER — Other Ambulatory Visit: Payer: Self-pay | Admitting: Gynecology

## 2014-09-13 DIAGNOSIS — R87618 Other abnormal cytological findings on specimens from cervix uteri: Secondary | ICD-10-CM

## 2014-09-13 DIAGNOSIS — N926 Irregular menstruation, unspecified: Secondary | ICD-10-CM

## 2014-09-13 DIAGNOSIS — N8331 Acquired atrophy of ovary: Secondary | ICD-10-CM

## 2014-09-13 DIAGNOSIS — N83319 Acquired atrophy of ovary, unspecified side: Secondary | ICD-10-CM

## 2014-09-13 DIAGNOSIS — D251 Intramural leiomyoma of uterus: Secondary | ICD-10-CM

## 2014-09-13 DIAGNOSIS — R8789 Other abnormal findings in specimens from female genital organs: Secondary | ICD-10-CM

## 2014-09-13 NOTE — Patient Instructions (Signed)
Office you with biopsy results

## 2014-09-13 NOTE — Progress Notes (Signed)
Monica Solis Feb 12, 1961 [id number]        53 y.o.  G1P0010 Presents for sonohysterogram. History of irregular menses this past year. Recent Pap smear showed endometrial cells.  Past medical history,surgical history, problem list, medications, allergies, family history and social history were all reviewed and documented in the EPIC chart.  Directed ROS with pertinent positives and negatives documented in the history of present illness/assessment and plan.  Exam: [place] assistant General appearance:  Normal External BUS vagina normal. Cervix normal.  Ultrasound shows uterus normal size. 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 Endometrial echo 3.1 mm. Right and left ovaries normal. Cul-de-sac negative.  Sonohysterogram performed, sterile technique, using catheter introduction, good distention, no abnormality seen. Endometrial sample taken. Patient tolerated well.  Assessment/Plan:  54 y.o. G1P0010 with irregular menses consistent with a perimenopausal pattern. Pap smear showed benign endometrial cells which I think are due to her irregular bleeding. Ultrasound is negative with thin endometrial echo. Endometrial sample taken. Patient will follow up for results. Assuming negative then plan expectant management with menstrual calendar. If significant irregular menses patient will follow up otherwise will monitor over this coming year.     Anastasio Auerbach MD, 10:53 AM 09/13/2014

## 2014-09-27 DIAGNOSIS — D6851 Activated protein C resistance: Secondary | ICD-10-CM | POA: Insufficient documentation

## 2014-09-27 DIAGNOSIS — Z86718 Personal history of other venous thrombosis and embolism: Secondary | ICD-10-CM | POA: Insufficient documentation

## 2015-05-30 DIAGNOSIS — E039 Hypothyroidism, unspecified: Secondary | ICD-10-CM | POA: Insufficient documentation

## 2015-06-09 DIAGNOSIS — M19011 Primary osteoarthritis, right shoulder: Secondary | ICD-10-CM | POA: Insufficient documentation

## 2015-06-09 DIAGNOSIS — M7540 Impingement syndrome of unspecified shoulder: Secondary | ICD-10-CM | POA: Insufficient documentation

## 2015-06-09 DIAGNOSIS — M25819 Other specified joint disorders, unspecified shoulder: Secondary | ICD-10-CM | POA: Insufficient documentation

## 2015-08-12 ENCOUNTER — Ambulatory Visit (INDEPENDENT_AMBULATORY_CARE_PROVIDER_SITE_OTHER): Payer: 59 | Admitting: Gynecology

## 2015-08-12 ENCOUNTER — Encounter: Payer: Self-pay | Admitting: Gynecology

## 2015-08-12 VITALS — BP 130/80 | Ht 68.5 in | Wt 328.0 lb

## 2015-08-12 DIAGNOSIS — R21 Rash and other nonspecific skin eruption: Secondary | ICD-10-CM

## 2015-08-12 DIAGNOSIS — N898 Other specified noninflammatory disorders of vagina: Secondary | ICD-10-CM

## 2015-08-12 DIAGNOSIS — Z01419 Encounter for gynecological examination (general) (routine) without abnormal findings: Secondary | ICD-10-CM

## 2015-08-12 DIAGNOSIS — N926 Irregular menstruation, unspecified: Secondary | ICD-10-CM | POA: Diagnosis not present

## 2015-08-12 MED ORDER — NYSTATIN 100000 UNIT/GM EX POWD: CUTANEOUS | Status: DC

## 2015-08-12 NOTE — Patient Instructions (Signed)
Apply the powder under both breasts. If the rash continues to follow up with dermatology.  Follow up for your screening mammogram.  You may obtain a copy of any labs that were done today by logging onto MyChart as outlined in the instructions provided with your AVS (after visit summary). The office will not call with normal lab results but certainly if there are any significant abnormalities then we will contact you.   Health Maintenance Adopting a healthy lifestyle and getting preventive care can go a long way to promote health and wellness. Talk with your health care provider about what schedule of regular examinations is right for you. This is a good chance for you to check in with your provider about disease prevention and staying healthy. In between checkups, there are plenty of things you can do on your own. Experts have done a lot of research about which lifestyle changes and preventive measures are most likely to keep you healthy. Ask your health care provider for more information. WEIGHT AND DIET  Eat a healthy diet  Be sure to include plenty of vegetables, fruits, low-fat dairy products, and lean protein.  Do not eat a lot of foods high in solid fats, added sugars, or salt.  Get regular exercise. This is one of the most important things you can do for your health.  Most adults should exercise for at least 150 minutes each week. The exercise should increase your heart rate and make you sweat (moderate-intensity exercise).  Most adults should also do strengthening exercises at least twice a week. This is in addition to the moderate-intensity exercise.  Maintain a healthy weight  Body mass index (BMI) is a measurement that can be used to identify possible weight problems. It estimates body fat based on height and weight. Your health care provider can help determine your BMI and help you achieve or maintain a healthy weight.  For females 77 years of age and older:   A BMI below  18.5 is considered underweight.  A BMI of 18.5 to 24.9 is normal.  A BMI of 25 to 29.9 is considered overweight.  A BMI of 30 and above is considered obese.  Watch levels of cholesterol and blood lipids  You should start having your blood tested for lipids and cholesterol at 53 years of age, then have this test every 5 years.  You may need to have your cholesterol levels checked more often if:  Your lipid or cholesterol levels are high.  You are older than 53 years of age.  You are at high risk for heart disease.  CANCER SCREENING   Lung Cancer  Lung cancer screening is recommended for adults 53-51 years old who are at high risk for lung cancer because of a history of smoking.  A yearly low-dose CT scan of the lungs is recommended for people who:  Currently smoke.  Have quit within the past 15 years.  Have at least a 30-pack-year history of smoking. A pack year is smoking an average of one pack of cigarettes a day for 1 year.  Yearly screening should continue until it has been 15 years since you quit.  Yearly screening should stop if you develop a health problem that would prevent you from having lung cancer treatment.  Breast Cancer  Practice breast self-awareness. This means understanding how your breasts normally appear and feel.  It also means doing regular breast self-exams. Let your health care provider know about any changes, no matter how small.  If you are in your 20s or 30s, you should have a clinical breast exam (CBE) by a health care provider every 1-3 years as part of a regular health exam.  If you are 90 or older, have a CBE every year. Also consider having a breast X-ray (mammogram) every year.  If you have a family history of breast cancer, talk to your health care provider about genetic screening.  If you are at high risk for breast cancer, talk to your health care provider about having an MRI and a mammogram every year.  Breast cancer gene (BRCA)  assessment is recommended for women who have family members with BRCA-related cancers. BRCA-related cancers include:  Breast.  Ovarian.  Tubal.  Peritoneal cancers.  Results of the assessment will determine the need for genetic counseling and BRCA1 and BRCA2 testing. Cervical Cancer Routine pelvic examinations to screen for cervical cancer are no longer recommended for nonpregnant women who are considered low risk for cancer of the pelvic organs (ovaries, uterus, and vagina) and who do not have symptoms. A pelvic examination may be necessary if you have symptoms including those associated with pelvic infections. Ask your health care provider if a screening pelvic exam is right for you.   The Pap test is the screening test for cervical cancer for women who are considered at risk.  If you had a hysterectomy for a problem that was not cancer or a condition that could lead to cancer, then you no longer need Pap tests.  If you are older than 65 years, and you have had normal Pap tests for the past 10 years, you no longer need to have Pap tests.  If you have had past treatment for cervical cancer or a condition that could lead to cancer, you need Pap tests and screening for cancer for at least 20 years after your treatment.  If you no longer get a Pap test, assess your risk factors if they change (such as having a new sexual partner). This can affect whether you should start being screened again.  Some women have medical problems that increase their chance of getting cervical cancer. If this is the case for you, your health care provider may recommend more frequent screening and Pap tests.  The human papillomavirus (HPV) test is another test that may be used for cervical cancer screening. The HPV test looks for the virus that can cause cell changes in the cervix. The cells collected during the Pap test can be tested for HPV.  The HPV test can be used to screen women 42 years of age and older.  Getting tested for HPV can extend the interval between normal Pap tests from three to five years.  An HPV test also should be used to screen women of any age who have unclear Pap test results.  After 53 years of age, women should have HPV testing as often as Pap tests.  Colorectal Cancer  This type of cancer can be detected and often prevented.  Routine colorectal cancer screening usually begins at 53 years of age and continues through 53 years of age.  Your health care provider may recommend screening at an earlier age if you have risk factors for colon cancer.  Your health care provider may also recommend using home test kits to check for hidden blood in the stool.  A small camera at the end of a tube can be used to examine your colon directly (sigmoidoscopy or colonoscopy). This is done to check for  the earliest forms of colorectal cancer.  Routine screening usually begins at age 71.  Direct examination of the colon should be repeated every 5-10 years through 53 years of age. However, you may need to be screened more often if early forms of precancerous polyps or small growths are found. Skin Cancer  Check your skin from head to toe regularly.  Tell your health care provider about any new moles or changes in moles, especially if there is a change in a mole's shape or color.  Also tell your health care provider if you have a mole that is larger than the size of a pencil eraser.  Always use sunscreen. Apply sunscreen liberally and repeatedly throughout the day.  Protect yourself by wearing long sleeves, pants, a wide-brimmed hat, and sunglasses whenever you are outside. HEART DISEASE, DIABETES, AND HIGH BLOOD PRESSURE   Have your blood pressure checked at least every 1-2 years. High blood pressure causes heart disease and increases the risk of stroke.  If you are between 84 years and 71 years old, ask your health care provider if you should take aspirin to prevent  strokes.  Have regular diabetes screenings. This involves taking a blood sample to check your fasting blood sugar level.  If you are at a normal weight and have a low risk for diabetes, have this test once every three years after 53 years of age.  If you are overweight and have a high risk for diabetes, consider being tested at a younger age or more often. PREVENTING INFECTION  Hepatitis B  If you have a higher risk for hepatitis B, you should be screened for this virus. You are considered at high risk for hepatitis B if:  You were born in a country where hepatitis B is common. Ask your health care provider which countries are considered high risk.  Your parents were born in a high-risk country, and you have not been immunized against hepatitis B (hepatitis B vaccine).  You have HIV or AIDS.  You use needles to inject street drugs.  You live with someone who has hepatitis B.  You have had sex with someone who has hepatitis B.  You get hemodialysis treatment.  You take certain medicines for conditions, including cancer, organ transplantation, and autoimmune conditions. Hepatitis C  Blood testing is recommended for:  Everyone born from [birth year] through 1964.  Anyone with known risk factors for hepatitis C. Sexually transmitted infections (STIs)  You should be screened for sexually transmitted infections (STIs) including gonorrhea and chlamydia if:  You are sexually active and are younger than 53 years of age.  You are older than 53 years of age and your health care provider tells you that you are at risk for this type of infection.  Your sexual activity has changed since you were last screened and you are at an increased risk for chlamydia or gonorrhea. Ask your health care provider if you are at risk.  If you do not have HIV, but are at risk, it may be recommended that you take a prescription medicine daily to prevent HIV infection. This is called pre-exposure prophylaxis  (PrEP). You are considered at risk if:  You are sexually active and do not regularly use condoms or know the HIV status of your partner(s).  You take drugs by injection.  You are sexually active with a partner who has HIV. Talk with your health care provider about whether you are at high risk of being infected with HIV. If you  choose to begin PrEP, you should first be tested for HIV. You should then be tested every 3 months for as long as you are taking PrEP.  PREGNANCY   If you are premenopausal and you may become pregnant, ask your health care provider about preconception counseling.  If you may become pregnant, take 400 to 800 micrograms (mcg) of folic acid every day.  If you want to prevent pregnancy, talk to your health care provider about birth control (contraception). OSTEOPOROSIS AND MENOPAUSE   Osteoporosis is a disease in which the bones lose minerals and strength with aging. This can result in serious bone fractures. Your risk for osteoporosis can be identified using a bone density scan.  If you are 73 years of age or older, or if you are at risk for osteoporosis and fractures, ask your health care provider if you should be screened.  Ask your health care provider whether you should take a calcium or vitamin D supplement to lower your risk for osteoporosis.  Menopause may have certain physical symptoms and risks.  Hormone replacement therapy may reduce some of these symptoms and risks. Talk to your health care provider about whether hormone replacement therapy is right for you.  HOME CARE INSTRUCTIONS   Schedule regular health, dental, and eye exams.  Stay current with your immunizations.   Do not use any tobacco products including cigarettes, chewing tobacco, or electronic cigarettes.  If you are pregnant, do not drink alcohol.  If you are breastfeeding, limit how much and how often you drink alcohol.  Limit alcohol intake to no more than 1 drink per day for  nonpregnant women. One drink equals 12 ounces of beer, 5 ounces of wine, or 1 ounces of hard liquor.  Do not use street drugs.  Do not share needles.  Ask your health care provider for help if you need support or information about quitting drugs.  Tell your health care provider if you often feel depressed.  Tell your health care provider if you have ever been abused or do not feel safe at home. Document Released: 04/16/2011 Document Revised: 02/15/2014 Document Reviewed: 09/02/2013 [facility] Patient Information 2015 [place], [place]. This information is not intended to replace advice given to you by your health care provider. Make sure you discuss any questions you have with your health care provider.

## 2015-08-12 NOTE — Addendum Note (Signed)
Addended by: Joaquin Music on: 08/12/2015 12:57 PM   Modules accepted: Orders

## 2015-08-12 NOTE — Progress Notes (Signed)
Monica Solis 1961/09/02 [id number]        54 y.o.  G1P0010  Patient's last menstrual period was 06/19/2015. for annual exam.  Several issues noted below.  Past medical history,surgical history, problem list, medications, allergies, family history and social history were all reviewed and documented as reviewed in the EPIC chart.  ROS:  Performed with pertinent positives and negatives included in the history, assessment and plan.   Additional significant findings :  none   Exam: [name] Vitals:   08/12/15 0847  BP: 130/80  Height: 5' 8.5" (1.74 m)  Weight: 328 lb (148.78 kg)   General appearance:  Normal affect, orientation and appearance. Skin: Grossly normal HEENT: Without gross lesions.  No cervical or supraclavicular adenopathy. Thyroid normal.  Lungs:  Clear without wheezing, rales or rhonchi Cardiac: RR, without RMG Abdominal:  Soft, nontender, without masses, guarding, rebound, organomegaly or hernia Breasts:  Examined lying and sitting without masses, retractions, discharge or axillary adenopathy.  Rash under both breasts noted consistent with fungal Pelvic:  Ext/BUS/vagina normal  Cervix normal  Uterus difficult to palpate but grossly normal size, nontender   Adnexa  Without gross masses or tenderness    Anus and perineum  Normal   Rectovaginal  Normal sphincter tone without palpated masses or tenderness.    Assessment/Plan:  54 y.o. G44P0010 female for annual exam with irregular menses, tubal sterilization.   1. Irregular menses. Patient continues to have 2 or 3 menses each year. Getting less frequent over the last 2 or 3 years.  Did have a negative sonohysterogram with endometrial biopsy one year ago due to endometrial cells on Pap smear which we think was secondary to spotting at the time of Pap smear. Notes sister went through menopause at age 87.  Will keep menstrual calendar and as long as less frequent but regular menses will follow. If prolonged or  atypical bleeding she knows to call. 2. Vaginal dryness. Patient notes vaginal dryness over the past year getting worse. Options for management to include OTC moisturizers up to and including vaginal estrogen discussed. Patient is going to try the moisturizers first to see if this doesn't help and will follow up if wants to pursue alternatives. 3. Rash under both breasts. Had seen dermatologist with several different treatments tried but ultimately ineffective. Temovate 0.05% cream last year prescribed and did not seem to help. We'll try nystatin powder twice a day. If persists that I recommended she follow up with dermatology and she agrees. 4. Mammography due this coming November I gave her a request slip for [facility]. SBE monthly reviewed. 5. Pap smear/HPV 2015 negative.  No Pap smear done today.  No history of abnormal Pap smears. 6. Colonoscopy 2013. Repeat at their recommended interval. 7. Health maintenance. No routine blood work done as this is done at her primary physician's office. Follow up 1 year, sooner as needed.   Anastasio Auerbach MD, 9:19 AM 08/12/2015

## 2015-08-25 ENCOUNTER — Encounter: Payer: Self-pay | Admitting: Gynecology

## 2016-01-17 DIAGNOSIS — Z794 Long term (current) use of insulin: Secondary | ICD-10-CM | POA: Insufficient documentation

## 2016-01-17 DIAGNOSIS — E039 Hypothyroidism, unspecified: Secondary | ICD-10-CM | POA: Insufficient documentation

## 2016-01-17 DIAGNOSIS — E785 Hyperlipidemia, unspecified: Secondary | ICD-10-CM | POA: Insufficient documentation

## 2016-01-17 DIAGNOSIS — E038 Other specified hypothyroidism: Secondary | ICD-10-CM | POA: Insufficient documentation

## 2016-01-17 DIAGNOSIS — R809 Proteinuria, unspecified: Secondary | ICD-10-CM | POA: Insufficient documentation

## 2016-08-13 ENCOUNTER — Encounter: Payer: 59 | Admitting: Gynecology

## 2016-09-14 ENCOUNTER — Encounter: Payer: Self-pay | Admitting: Gynecology

## 2016-09-14 ENCOUNTER — Telehealth: Payer: Self-pay | Admitting: *Deleted

## 2016-09-14 ENCOUNTER — Ambulatory Visit (INDEPENDENT_AMBULATORY_CARE_PROVIDER_SITE_OTHER): Payer: 59 | Admitting: Gynecology

## 2016-09-14 VITALS — BP 136/84 | Ht 69.0 in | Wt 327.0 lb

## 2016-09-14 DIAGNOSIS — N926 Irregular menstruation, unspecified: Secondary | ICD-10-CM

## 2016-09-14 DIAGNOSIS — Z01419 Encounter for gynecological examination (general) (routine) without abnormal findings: Secondary | ICD-10-CM | POA: Diagnosis not present

## 2016-09-14 DIAGNOSIS — N763 Subacute and chronic vulvitis: Secondary | ICD-10-CM | POA: Diagnosis not present

## 2016-09-14 MED ORDER — NYSTATIN-TRIAMCINOLONE 100000-0.1 UNIT/GM-% EX OINT: 1.0000 "application " | TOPICAL_OINTMENT | Freq: Two times a day (BID) | CUTANEOUS | 2 refills | Status: DC

## 2016-09-14 NOTE — Patient Instructions (Signed)
Follow up for the ultrasound as scheduled.  Call me if the prescribed cream does not help with the vaginal irritation.

## 2016-09-14 NOTE — Telephone Encounter (Signed)
Per [facility] Ref # [id number] [facility] codes [phone] & [id number] covered 80% codes [phone] & [phone] covered 100% after deductible $600 which is met.  Therefore pt respon $116.74. Pt will be informed KW

## 2016-09-14 NOTE — Progress Notes (Signed)
    Monica Solis 1961/01/26 [id number]        55 y.o.  G1P0010  for annual exam.  Also complaining of to additional issues:  1. Irregular menses. Patient's last menstrual period was October. Previous menses was December preceding. She had sporadic menses in the years preceding this. No prolonged or atypical bleeding that seems like a regular menses when occurs. Not having significant hot flushes night sweats or vaginal dryness. 2. Intermittent vulvar itching and irritation. Does have a history of yeast infections particularly under her breasts and groin in the past. No significant discharge or vaginal odor. No urinary symptoms such as frequency dysuria or urgency.  Past medical history,surgical history, problem list, medications, allergies, family history and social history were all reviewed and documented as reviewed in the EPIC chart.  ROS:  Performed with pertinent positives and negatives included in the history, assessment and plan.   Additional significant findings :  None   Exam: [name] assistant Vitals:   09/14/16 0932  BP: 136/84  Weight: (!) 327 lb (148.3 kg)  Height: 5\' 9"  (1.753 m)   Body mass index is 48.29 kg/m.  General appearance:  Normal affect, orientation and appearance. Skin: Grossly normal HEENT: Without gross lesions.  No cervical or supraclavicular adenopathy. Thyroid normal.  Lungs:  Clear without wheezing, rales or rhonchi Cardiac: RR, without RMG Abdominal:  Soft, nontender, without masses, guarding, rebound, organomegaly or hernia Breasts:  Examined lying and sitting without masses, retractions, discharge or axillary adenopathy. Pelvic:  Ext, BUS, Vagina with generalized low-level irritative changes suggesting fungal. No concerning areas.  Cervix normal  Uterus difficult to palpate but no gross masses or tenderness  Adnexa without gross masses or tenderness    Anus and perineum normal   Rectovaginal normal sphincter tone without palpated masses or  tenderness.    Assessment/Plan:  55 y.o. G59P0010 female for annual exam.   1. Irregular menses.  Suspect this is due to her perimenopausal status but given her weight and potential for hyperplasia I recommended we proceed with sonohysterogram to look at her endometrial echo and possible endometrial sample. Will also allow for ovarian surveillance given the difficulty of her exam. We'll check baseline FSH now. Patient will schedule her sonohysterogram and follow up for this afterwards. 2. Vulvitis. Clinically appears as fungal as well as historically. Mycolog ointment 60 g tube intermittent use as needed with 2 refills. Patient will follow up if her symptoms continue despite this. If they clear that she'll use this as needed. 3. Mammography due now and I gave her a request slip to schedule at [facility]. SBE monthly reviewed. 4. Pap smear/HPV 2015. No Pap smear done today. No history of significant abnormal Pap smears. Plan repeat Pap smear at 5 year interval per current screening guidelines. 5. Colonoscopy 2013. Repeat at their recommended interval. 6. Health maintenance. No routine lab work done as patient does this elsewhere. Is in the process of being evaluated for gastric surgery. Follow up for sonohysterogram otherwise follow up in one year, sooner as needed.  Additional time in excess of her routine exam was spent in direct face to face counseling and coordination of care in regards to her irregular menses and vulvitis.    Anastasio Auerbach MD, 10:29 AM 09/14/2016

## 2016-09-15 LAB — FOLLICLE STIMULATING HORMONE: FSH: 23.1 m[IU]/mL

## 2016-09-18 ENCOUNTER — Other Ambulatory Visit: Payer: Self-pay | Admitting: Gynecology

## 2016-09-18 DIAGNOSIS — N939 Abnormal uterine and vaginal bleeding, unspecified: Secondary | ICD-10-CM

## 2016-09-21 ENCOUNTER — Other Ambulatory Visit (HOSPITAL_COMMUNITY): Payer: Self-pay | Admitting: General Surgery

## 2016-09-24 ENCOUNTER — Other Ambulatory Visit: Payer: Self-pay | Admitting: Gynecology

## 2016-09-24 ENCOUNTER — Encounter: Payer: Self-pay | Admitting: Gynecology

## 2016-09-24 ENCOUNTER — Ambulatory Visit (INDEPENDENT_AMBULATORY_CARE_PROVIDER_SITE_OTHER): Payer: 59 | Admitting: Gynecology

## 2016-09-24 ENCOUNTER — Ambulatory Visit (INDEPENDENT_AMBULATORY_CARE_PROVIDER_SITE_OTHER): Payer: 59

## 2016-09-24 VITALS — BP 126/80 | Ht 68.0 in | Wt 327.0 lb

## 2016-09-24 DIAGNOSIS — N950 Postmenopausal bleeding: Secondary | ICD-10-CM

## 2016-09-24 DIAGNOSIS — N926 Irregular menstruation, unspecified: Secondary | ICD-10-CM

## 2016-09-24 DIAGNOSIS — N939 Abnormal uterine and vaginal bleeding, unspecified: Secondary | ICD-10-CM

## 2016-09-24 DIAGNOSIS — N840 Polyp of corpus uteri: Secondary | ICD-10-CM

## 2016-09-24 NOTE — Progress Notes (Signed)
    Monica Solis Apr 12, 1961 [id number]        55 y.o.  G1P0010 presents for sonohysterogram due to a history of irregular menses over the past year. She's having sporadic once to twice yearly bleeding. No prolonged or atypical bleeding but seems like a regular menses when it occurs. Does have a history of polyps 2009. Negative sonohysterogram 08/2014 with atrophic endometrium on biopsy  Past medical history,surgical history, problem list, medications, allergies, family history and social history were all reviewed and documented in the EPIC chart.  Directed ROS with pertinent positives and negatives documented in the history of present illness/assessment and plan.  Exam: Pam [place] assistant Vitals:   09/24/16 1152  BP: 126/80  Weight: (!) 327 lb (148.3 kg)  Height: 5\' 8"  (1.727 m)   General appearance:  Normal Abdomen obese with no gross masses or tenderness Pelvic external BUS vagina normal. Cervix normal. Uterus unable to palpate but no gross masses or tenderness.  Ultrasound transvaginal and transabdominal shows uterus normal size and echotexture anteverted. 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 Endometrial echo 6.5 mm. Right and left ovaries grossly normal. Cul-de-sac negative.  Sonohysterogram performed, sterile technique, single-tooth tenaculum anterior lip stabilization, easy catheter introduction, good distention with 13 x 10 x 13 mm polyp fundal region. Endometrial biopsy taken. Patient tolerated well.  Assessment/Plan:  55 y.o. G1P0010 with history of irregular bleeding and sonohysterogram suggesting endometrial polyp. Options for management reviewed and ultimately I recommended hysteroscopy D&C with resection of the endometrial polyp. Patient agrees with this and we will move toward scheduling her for surgery. Patient will also follow up for the biopsy results in several days.    Anastasio Auerbach MD, 12:07 PM 09/24/2016

## 2016-09-24 NOTE — Patient Instructions (Signed)
Office will call you with the biopsy results.  Office will call you to schedule the D&C

## 2016-09-26 ENCOUNTER — Telehealth: Payer: Self-pay

## 2016-09-26 ENCOUNTER — Other Ambulatory Visit: Payer: Self-pay | Admitting: Gynecology

## 2016-09-26 ENCOUNTER — Encounter: Payer: Self-pay | Admitting: Gynecology

## 2016-09-26 MED ORDER — ENOXAPARIN SODIUM 40 MG/0.4ML ~~LOC~~ SOLN
SUBCUTANEOUS | 0 refills | Status: DC
Start: 2016-09-26 — End: 2016-11-09

## 2016-09-26 MED ORDER — MISOPROSTOL 200 MCG PO TABS: ORAL_TABLET | ORAL | 0 refills | Status: DC

## 2016-09-26 NOTE — Telephone Encounter (Signed)
I called patient and spoke with her about scheduling surgery. She has met her deductible for 2017 and would like to schedule before end of year. I explained I had one slot left. She is fine with the 10/05/16 9:00am spot at [facility].  I explained to her that she will need to use Cytotec tab vaginally hs before surgery and Rx was sent in. I did advise her she could feel some menstrual like cramping or see some spotting or light bleeding overnight after inserting Cytotec and this is no problem just means it is working. She will expect a call from our appt desk today to schedule pre op appt with Dr. TF prior to surgery. [facility] will also be calling. 

## 2016-09-26 NOTE — Telephone Encounter (Signed)
Left message on home machine letting her know per DPR access note on file.

## 2016-09-26 NOTE — Telephone Encounter (Signed)
Patient called back and I informed her regarding need for Lovenox daily x 14 days after surgery. She has done this before and is familiar with process.  Patient wanted to know if she needed to d/c or alter any of the meds she takes preoperatively?    ASA 325 mg daily Gabapentin Ibesartan  Generic Synthroid Novalog  Avapro

## 2016-09-26 NOTE — Telephone Encounter (Signed)
I received staff message from Dr. Loetta Rough "Notice patient was admitted to the schedule. She will need Cytotec 200 g intravaginal night before. She will also need Lovenox 40 mg prefilled syringes #14 to be used 1 daily for 2 weeks following surgery. I put the order in for her to receive the preoperative dose in the holding area but she will need the prescription for postoperative administration at home."  I had already spoken with patient regarding Cytotec and that Rx was sent earlier this morning.  I left message for patient to call me and went ahead and sent the Lovenox Rx in and will instruct her when she calls me back.

## 2016-09-26 NOTE — Telephone Encounter (Signed)
I think the rest are okay to continue

## 2016-09-27 ENCOUNTER — Telehealth: Payer: Self-pay | Admitting: Cardiovascular Disease

## 2016-09-27 NOTE — Telephone Encounter (Signed)
09/27/2016 Rcvd packet from [facility] for apt with Dr. Gwenlyn Found on 11/09/2016. Gave packet to [employer] . mwc

## 2016-10-01 ENCOUNTER — Ambulatory Visit (INDEPENDENT_AMBULATORY_CARE_PROVIDER_SITE_OTHER): Payer: 59 | Admitting: Gynecology

## 2016-10-01 ENCOUNTER — Encounter: Payer: Self-pay | Admitting: Gynecology

## 2016-10-01 ENCOUNTER — Encounter (HOSPITAL_BASED_OUTPATIENT_CLINIC_OR_DEPARTMENT_OTHER): Payer: Self-pay | Admitting: *Deleted

## 2016-10-01 VITALS — BP 118/76

## 2016-10-01 DIAGNOSIS — N926 Irregular menstruation, unspecified: Secondary | ICD-10-CM | POA: Diagnosis not present

## 2016-10-01 NOTE — Progress Notes (Signed)
Monica Solis [birth year] [id number]   Preoperative consult  Chief complaint: irregular menses  History of present illness: 55 y.o. G1P0010 with history of sporadic menses 1-2 times yearly.  recent [place] 23.  sonohysterogram shows a 13 x 10 x 13 mm polyp in the fundal region.endometrial biopsy showed scant inactive endometrium. Patient is admitted for hysteroscopy D&C with resection of her endometrial polyp.  Past medical history,surgical history, medications, allergies, family history and social history were all reviewed and documented in the EPIC chart.  ROS:  Was performed and pertinent positives and negatives are included in the history of present illness.  Exam:  [name] assistant Vitals:   10/01/16 0949  BP: 118/76   General: well developed, well nourished female, no acute distress HEENT: normal  Lungs: clear to auscultation without wheezing, rales or rhonchi  Cardiac: regular rate without rubs, murmurs or gallops  Abdomen: soft, nontender without masses, guarding, rebound, organomegaly  12/11/2017Pelvic: external bus vagina: normal   Cervix: grossly normal  Uterus: difficult to palpate but no gross masses or tenderness Adnexa: without gross masses or tenderness     Assessment/Plan:  55 y.o. G1P0010 with very infrequent menses and sonohysterogram consistent with endometrial polyp. For planned hysteroscopy D&C with resection of the endometrial polyp.  I reviewed the proposed surgery with the patient to include the expected intraoperative and postoperative courses as well as the recovery period. The use of the hysteroscope, resectoscope and the D&C portion were all discussed. The risks of surgery to include infection, prolonged antibiotics, hemorrhage necessitating transfusion and the risks of transfusion, including transfusion reaction, hepatitis, HIV, mad cow disease and other unknown entities were all discussed understood and accepted. The risk of damage to internal organs during  the procedure, either immediately recognized or delay recognized, including vagina, cervix, uterus, possible perforation causing damage to bowel, bladder, ureters, vessels and nerves necessitating major exploratory reparative surgery and future reparative surgeries including bladder repair, ureteral damage repair, bowel resection, ostomy formation was also discussed understood and accepted. The potential for distended media absorption leading to metabolic complications such as fluid overload, coma and seizures was also discussed understood and accepted. She understands there are no guarantees that this will relieve her regular menses and that she may continue to have sporadic menses following this. The patient is heterozygous for Leiden factor V mutation. We will plan on SCDs intraoperatively and Lovenox 40 mg preop and for 2 weeks following the procedure. The patient has a prescription and she already knows how to use this. The patient's questions were answered to her satisfaction and she is ready to proceed with surgery.    Anastasio Auerbach MD, 10:07 AM 10/01/2016

## 2016-10-01 NOTE — Progress Notes (Addendum)
   10/01/16 1701  OBSTRUCTIVE SLEEP APNEA  Have you ever been diagnosed with sleep apnea through a sleep study? No  Do you snore loudly (loud enough to be heard through closed doors)?  1  Do you often feel tired, fatigued, or sleepy during the daytime (such as falling asleep during driving or talking to someone)? 1  Has anyone observed you stop breathing during your sleep? 0  Do you have, or are you being treated for high blood pressure? 1  BMI more than 35 kg/m2? 1  Age > 18 (1-yes) 1  Female Gender (Yes=1) 0  Obstructive Sleep Apnea Score 5  Score 5 or greater  Results sent to PCP

## 2016-10-01 NOTE — Progress Notes (Addendum)
NPO AFTER MN.  ARRIVE AT 0700.  PT IS HAVING LAB WORK DONE AT [facility] ON Thursday 10-04-2016, INCLUDING CBC AND CMET, WILL CALL AND HAVE RESULTS FAXED.  CURRENT EKG W/ PCP,  TO BE FAXED FROM Fredric Mare PA [facility] , [address] IN [place] [id number]).  NEEDS URINE PREG.  WILL TAKE SYNTHROID AND AVAPRO AM DOS W/ SIPS OF WATER.   ADDENDUM:   REVIEWED CHART W/ DR  ROSE MDA, OK TO PROCEED.

## 2016-10-01 NOTE — H&P (Signed)
Monica Solis Feb 24, 1961 [id number]   History and Physical  Chief complaint: irregular menses  History of present illness: 55 y.o. G1P0010 with history of sporadic menses 1-2 times yearly.  recent [place] 23.  sonohysterogram shows a 13 x 10 x 13 mm polyp in the fundal region.endometrial biopsy showed scant inactive endometrium. Patient is admitted for hysteroscopy D&C with resection of her endometrial polyp.  Past medical history,surgical history, medications, allergies, family history and social history were all reviewed and documented in the EPIC chart.  ROS:  Was performed and pertinent positives and negatives are included in the history of present illness.  Exam:  [name] assistant Vitals:   10/01/16 0949  BP: 118/76   General: well developed, well nourished female, no acute distress HEENT: normal  Lungs: clear to auscultation without wheezing, rales or rhonchi  Cardiac: regular rate without rubs, murmurs or gallops  Abdomen: soft, nontender without masses, guarding, rebound, organomegaly  12/11/2017Pelvic: external bus vagina: normal   Cervix: grossly normal  Uterus: difficult to palpate but no gross masses or tenderness Adnexa: without gross masses or tenderness     Assessment/Plan:  55 y.o. G1P0010 with very infrequent menses and sonohysterogram consistent with endometrial polyp. For planned hysteroscopy D&C with resection of the endometrial polyp.  I reviewed the proposed surgery with the patient to include the expected intraoperative and postoperative courses as well as the recovery period. The use of the hysteroscope, resectoscope and the D&C portion were all discussed. The risks of surgery to include infection, prolonged antibiotics, hemorrhage necessitating transfusion and the risks of transfusion, including transfusion reaction, hepatitis, HIV, mad cow disease and other unknown entities were all discussed understood and accepted. The risk of damage to internal organs during  the procedure, either immediately recognized or delay recognized, including vagina, cervix, uterus, possible perforation causing damage to bowel, bladder, ureters, vessels and nerves necessitating major exploratory reparative surgery and future reparative surgeries including bladder repair, ureteral damage repair, bowel resection, ostomy formation was also discussed understood and accepted. The potential for distended media absorption leading to metabolic complications such as fluid overload, coma and seizures was also discussed understood and accepted. She understands there are no guarantees that this will relieve her regular menses and that she may continue to have sporadic menses following this. The patient is heterozygous for Leiden factor V mutation. We will plan on SCDs intraoperatively and Lovenox 40 mg preop and for 2 weeks following the procedure. The patient has a prescription and she already knows how to use this. The patient's questions were answered to her satisfaction and she is ready to proceed with surgery.    Anastasio Auerbach MD, 10:22 AM 10/01/2016

## 2016-10-01 NOTE — Patient Instructions (Signed)
Followup for surgery as scheduled. 

## 2016-10-03 ENCOUNTER — Telehealth: Payer: Self-pay

## 2016-10-03 NOTE — Telephone Encounter (Signed)
Okay 

## 2016-10-03 NOTE — Telephone Encounter (Signed)
Langley Gauss said you placed a CMET and CBC on your preoperative orders.  She said patient is having labs drawn as ordered by [facility] tomorrow morning and it includes these. She spoke with them and they are going to fax these results to her and she will review and call you if any results are abnormal.  She said she did not see any reason to have patient have it drawn twice and concern that ins may not cover it twice. She wanted you to know in case you tried to look in EPIC to see the results.

## 2016-10-05 ENCOUNTER — Ambulatory Visit (HOSPITAL_BASED_OUTPATIENT_CLINIC_OR_DEPARTMENT_OTHER): Payer: 59 | Admitting: Anesthesiology

## 2016-10-05 ENCOUNTER — Encounter (HOSPITAL_BASED_OUTPATIENT_CLINIC_OR_DEPARTMENT_OTHER)
Admission: RE | Disposition: A | Discharge status: Home / Self Care | Payer: Self-pay | Source: Ambulatory Visit | Attending: Gynecology

## 2016-10-05 ENCOUNTER — Ambulatory Visit (HOSPITAL_BASED_OUTPATIENT_CLINIC_OR_DEPARTMENT_OTHER)
Admission: RE | Admit: 2016-10-05 | Discharge: 2016-10-05 | Disposition: A | Discharge status: Home / Self Care | Payer: 59 | Source: Ambulatory Visit | Attending: Gynecology | Admitting: Gynecology

## 2016-10-05 ENCOUNTER — Encounter (HOSPITAL_BASED_OUTPATIENT_CLINIC_OR_DEPARTMENT_OTHER): Payer: Self-pay

## 2016-10-05 DIAGNOSIS — Z794 Long term (current) use of insulin: Secondary | ICD-10-CM | POA: Insufficient documentation

## 2016-10-05 DIAGNOSIS — E119 Type 2 diabetes mellitus without complications: Secondary | ICD-10-CM | POA: Insufficient documentation

## 2016-10-05 DIAGNOSIS — I10 Essential (primary) hypertension: Secondary | ICD-10-CM | POA: Insufficient documentation

## 2016-10-05 DIAGNOSIS — Z79899 Other long term (current) drug therapy: Secondary | ICD-10-CM | POA: Diagnosis not present

## 2016-10-05 DIAGNOSIS — Z87891 Personal history of nicotine dependence: Secondary | ICD-10-CM | POA: Insufficient documentation

## 2016-10-05 DIAGNOSIS — E039 Hypothyroidism, unspecified: Secondary | ICD-10-CM | POA: Insufficient documentation

## 2016-10-05 DIAGNOSIS — N840 Polyp of corpus uteri: Secondary | ICD-10-CM | POA: Diagnosis not present

## 2016-10-05 DIAGNOSIS — E669 Obesity, unspecified: Secondary | ICD-10-CM | POA: Insufficient documentation

## 2016-10-05 DIAGNOSIS — K219 Gastro-esophageal reflux disease without esophagitis: Secondary | ICD-10-CM | POA: Diagnosis not present

## 2016-10-05 DIAGNOSIS — Q512 Other doubling of uterus: Secondary | ICD-10-CM | POA: Insufficient documentation

## 2016-10-05 DIAGNOSIS — N938 Other specified abnormal uterine and vaginal bleeding: Secondary | ICD-10-CM | POA: Diagnosis not present

## 2016-10-05 HISTORY — DX: Personal history of other complications of pregnancy, childbirth and the puerperium: Z87.59

## 2016-10-05 HISTORY — DX: Polyneuropathy, unspecified: G62.9

## 2016-10-05 HISTORY — DX: Left bundle-branch block, unspecified: I44.7

## 2016-10-05 HISTORY — DX: Polyp of corpus uteri: N84.0

## 2016-10-05 HISTORY — PX: DILATATION & CURETTAGE/HYSTEROSCOPY WITH MYOSURE: SHX6511

## 2016-10-05 HISTORY — DX: Long term (current) use of insulin: Z79.4

## 2016-10-05 HISTORY — DX: Abnormal uterine and vaginal bleeding, unspecified: N93.9

## 2016-10-05 HISTORY — DX: Body Mass Index (BMI) 40.0 and over, adult: Z684

## 2016-10-05 HISTORY — DX: Personal history of other venous thrombosis and embolism: Z86.718

## 2016-10-05 HISTORY — DX: Diverticulosis of large intestine without perforation or abscess without bleeding: K57.30

## 2016-10-05 HISTORY — DX: Other specified personal risk factors, not elsewhere classified: Z91.89

## 2016-10-05 HISTORY — DX: Type 2 diabetes mellitus without complications: E11.9

## 2016-10-05 LAB — COMPREHENSIVE METABOLIC PANEL
ALK PHOS: 67 U/L (ref 38–126)
ALT: 39 U/L (ref 14–54)
ANION GAP: 7 (ref 5–15)
AST: 30 U/L (ref 15–41)
Albumin: 4.2 g/dL (ref 3.5–5.0)
BUN: 21 mg/dL — ABNORMAL HIGH (ref 6–20)
CALCIUM: 9.1 mg/dL (ref 8.9–10.3)
CO2: 27 mmol/L (ref 22–32)
Chloride: 104 mmol/L (ref 101–111)
Creatinine, Ser: 0.63 mg/dL (ref 0.44–1.00)
Glucose, Bld: 149 mg/dL — ABNORMAL HIGH (ref 65–99)
Potassium: 4.4 mmol/L (ref 3.5–5.1)
SODIUM: 138 mmol/L (ref 135–145)
Total Bilirubin: 1.4 mg/dL — ABNORMAL HIGH (ref 0.3–1.2)
Total Protein: 7.5 g/dL (ref 6.5–8.1)

## 2016-10-05 LAB — POCT PREGNANCY, URINE: PREG TEST UR: NEGATIVE

## 2016-10-05 LAB — POCT I-STAT, CHEM 8
BUN: 27 mg/dL — AB (ref 6–20)
Calcium, Ion: 1.15 mmol/L (ref 1.15–1.40)
Chloride: 104 mmol/L (ref 101–111)
Creatinine, Ser: 0.6 mg/dL (ref 0.44–1.00)
Glucose, Bld: 147 mg/dL — ABNORMAL HIGH (ref 65–99)
HEMATOCRIT: 48 % — AB (ref 36.0–46.0)
HEMOGLOBIN: 16.3 g/dL — AB (ref 12.0–15.0)
Potassium: 4.5 mmol/L (ref 3.5–5.1)
SODIUM: 141 mmol/L (ref 135–145)
TCO2: 29 mmol/L (ref 0–100)

## 2016-10-05 LAB — CBC
HCT: 43.8 % (ref 36.0–46.0)
HEMOGLOBIN: 15 g/dL (ref 12.0–15.0)
MCH: 30.8 pg (ref 26.0–34.0)
MCHC: 34.2 g/dL (ref 30.0–36.0)
MCV: 89.9 fL (ref 78.0–100.0)
PLATELETS: 225 10*3/uL (ref 150–400)
RBC: 4.87 MIL/uL (ref 3.87–5.11)
RDW: 13.3 % (ref 11.5–15.5)
WBC: 6.7 10*3/uL (ref 4.0–10.5)

## 2016-10-05 LAB — GLUCOSE, CAPILLARY: GLUCOSE-CAPILLARY: 134 mg/dL — AB (ref 65–99)

## 2016-10-05 SURGERY — DILATATION & CURETTAGE/HYSTEROSCOPY WITH MYOSURE
Anesthesia: General | Site: Vagina

## 2016-10-05 MED ORDER — LIDOCAINE 2% (20 MG/ML) 5 ML SYRINGE
INTRAMUSCULAR | Status: DC | PRN
  Administered 2016-10-05: 100 mg via INTRAVENOUS

## 2016-10-05 MED ORDER — LACTATED RINGERS IV SOLN
INTRAVENOUS | Status: DC
  Administered 2016-10-05: 08:00:00 via INTRAVENOUS
  Filled 2016-10-05: qty 1000

## 2016-10-05 MED ORDER — ENOXAPARIN SODIUM 40 MG/0.4ML ~~LOC~~ SOLN
40.0000 mg | SUBCUTANEOUS | Status: AC
  Administered 2016-10-05: 40 mg via SUBCUTANEOUS
  Filled 2016-10-05 (×2): qty 0.4

## 2016-10-05 MED ORDER — FENTANYL CITRATE (PF) 100 MCG/2ML IJ SOLN
INTRAMUSCULAR | Status: DC | PRN
  Administered 2016-10-05: 50 ug via INTRAVENOUS

## 2016-10-05 MED ORDER — ONDANSETRON HCL 4 MG/2ML IJ SOLN
INTRAMUSCULAR | Status: AC
  Filled 2016-10-05: qty 2

## 2016-10-05 MED ORDER — OXYCODONE HCL 5 MG/5ML PO SOLN
5.0000 mg | Freq: Once | ORAL | Status: DC | PRN
  Filled 2016-10-05: qty 5

## 2016-10-05 MED ORDER — FENTANYL CITRATE (PF) 100 MCG/2ML IJ SOLN
INTRAMUSCULAR | Status: AC
  Filled 2016-10-05: qty 2

## 2016-10-05 MED ORDER — PROPOFOL 10 MG/ML IV BOLUS
INTRAVENOUS | Status: DC | PRN
  Administered 2016-10-05: 300 mg via INTRAVENOUS

## 2016-10-05 MED ORDER — KETOROLAC TROMETHAMINE 30 MG/ML IJ SOLN
INTRAMUSCULAR | Status: DC | PRN
  Administered 2016-10-05: 30 mg via INTRAVENOUS

## 2016-10-05 MED ORDER — CEFOTETAN DISODIUM-DEXTROSE 2-2.08 GM-% IV SOLR
INTRAVENOUS | Status: AC
  Filled 2016-10-05: qty 50

## 2016-10-05 MED ORDER — DEXAMETHASONE SODIUM PHOSPHATE 10 MG/ML IJ SOLN
INTRAMUSCULAR | Status: AC
  Filled 2016-10-05: qty 1

## 2016-10-05 MED ORDER — DEXTROSE 5 % IV SOLN
2.0000 g | INTRAVENOUS | Status: AC
  Administered 2016-10-05: 2 g via INTRAVENOUS
  Filled 2016-10-05: qty 2

## 2016-10-05 MED ORDER — LIDOCAINE 2% (20 MG/ML) 5 ML SYRINGE
INTRAMUSCULAR | Status: AC
  Filled 2016-10-05: qty 5

## 2016-10-05 MED ORDER — FENTANYL CITRATE (PF) 100 MCG/2ML IJ SOLN
25.0000 ug | INTRAMUSCULAR | Status: DC | PRN
  Filled 2016-10-05: qty 1

## 2016-10-05 MED ORDER — OXYCODONE HCL 5 MG PO TABS
5.0000 mg | ORAL_TABLET | Freq: Once | ORAL | Status: DC | PRN
  Filled 2016-10-05: qty 1

## 2016-10-05 MED ORDER — SODIUM CHLORIDE 0.9 % IR SOLN
Status: DC | PRN
  Administered 2016-10-05: 3000 mL

## 2016-10-05 MED ORDER — OXYCODONE-ACETAMINOPHEN 5-325 MG PO TABS: 1.0000 | ORAL_TABLET | ORAL | 0 refills | Status: DC | PRN

## 2016-10-05 MED ORDER — PROPOFOL 10 MG/ML IV BOLUS
INTRAVENOUS | Status: AC
  Filled 2016-10-05: qty 40

## 2016-10-05 MED ORDER — MIDAZOLAM HCL 5 MG/5ML IJ SOLN
INTRAMUSCULAR | Status: DC | PRN
  Administered 2016-10-05: 2 mg via INTRAVENOUS

## 2016-10-05 MED ORDER — LIDOCAINE HCL 1 % IJ SOLN
INTRAMUSCULAR | Status: DC | PRN
  Administered 2016-10-05: 10 mL

## 2016-10-05 MED ORDER — MIDAZOLAM HCL 2 MG/2ML IJ SOLN
INTRAMUSCULAR | Status: AC
  Filled 2016-10-05: qty 2

## 2016-10-05 MED ORDER — KETOROLAC TROMETHAMINE 30 MG/ML IJ SOLN
INTRAMUSCULAR | Status: AC
  Filled 2016-10-05: qty 1

## 2016-10-05 MED ORDER — ONDANSETRON HCL 4 MG/2ML IJ SOLN
INTRAMUSCULAR | Status: DC | PRN
  Administered 2016-10-05: 4 mg via INTRAVENOUS

## 2016-10-05 SURGICAL SUPPLY — 22 items
CANISTER SUCTION 2500CC (MISCELLANEOUS) ×4 IMPLANT
CATH ROBINSON RED A/P 16FR (CATHETERS) ×2 IMPLANT
COVER BACK TABLE 60X90IN (DRAPES) ×2 IMPLANT
DEVICE MYOSURE LITE (MISCELLANEOUS) ×2 IMPLANT
DRAPE LG THREE QUARTER DISP (DRAPES) ×2 IMPLANT
DRSG TELFA 3X8 NADH (GAUZE/BANDAGES/DRESSINGS) ×2 IMPLANT
GLOVE BIO SURGEON STRL SZ7.5 (GLOVE) ×4 IMPLANT
GOWN STRL REUS W/TWL LRG LVL3 (GOWN DISPOSABLE) ×4 IMPLANT
IV NS IRRIG 3000ML ARTHROMATIC (IV SOLUTION) ×2 IMPLANT
KIT ROOM TURNOVER WOR (KITS) ×2 IMPLANT
LEGGING LITHOTOMY PAIR STRL (DRAPES) ×2 IMPLANT
NEEDLE SPNL 22GX3.5 QUINCKE BK (NEEDLE) ×2 IMPLANT
PACK BASIN DAY SURGERY FS (CUSTOM PROCEDURE TRAY) ×2 IMPLANT
PAD OB MATERNITY 4.3X12.25 (PERSONAL CARE ITEMS) ×2 IMPLANT
PAD PREP 24X48 CUFFED NSTRL (MISCELLANEOUS) ×2 IMPLANT
SEAL ROD LENS SCOPE MYOSURE (ABLATOR) ×2 IMPLANT
SYR CONTROL 10ML LL (SYRINGE) ×2 IMPLANT
TOWEL OR 17X24 6PK STRL BLUE (TOWEL DISPOSABLE) ×4 IMPLANT
TRAY DSU PREP LF (CUSTOM PROCEDURE TRAY) ×2 IMPLANT
TUBING AQUILEX INFLOW (TUBING) ×2 IMPLANT
TUBING AQUILEX OUTFLOW (TUBING) ×2 IMPLANT
WATER STERILE IRR 500ML POUR (IV SOLUTION) ×2 IMPLANT

## 2016-10-05 NOTE — Discharge Instructions (Signed)
° °  Postoperative Instructions Hysteroscopy D & C ° °Dr. Fontaine and the nursing staff have discussed postoperative instructions with you.  If you have any questions please ask them before you leave the hospital, or call Dr Fontaine’s office at [phone].   ° °We would like to emphasize the following instructions: ° ° °? Call the office to make your follow-up appointment as recommended by Dr Fontaine (usually 1-2 weeks). ° °? You were given a prescription, or one was ordered for you at the pharmacy you designated.  Get that prescription filled and take the medication according to instructions. ° °? You may eat a regular diet, but slowly until you start having bowel movements. ° °? Drink plenty of water daily. ° °? Nothing in the vagina (intercourse, douching, objects of any kind) for two weeks.  When reinitiating intercourse, if it is uncomfortable, stop and make an appointment with Dr Fontaine to be evaluated. ° °? No driving for one to two days until the effects of anesthesia has worn off.  No traveling out of town for several days. ° °? You may shower, but no baths for one week.  Walking up and down stairs is ok.  No heavy lifting, prolonged standing, repeated bending or any “working out” until your post op check. ° °? Rest frequently, listen to your body and do not push yourself and overdo it. ° °? Call if: ° °o Your pain medication does not seem strong enough. °o Worsening pain or abdominal bloating °o Persistent nausea or vomiting °o Difficulty with urination or bowel movements. °o Temperature of 101 degrees or higher. °o Heavy vaginal bleeding.  If your period is due, you may use tampons. °o You have any questions or concerns ° ° ° °Post Anesthesia Home Care Instructions ° °Activity: °Get plenty of rest for the remainder of the day. A responsible adult should stay with you for 24 hours following the procedure.  °For the next 24 hours, DO NOT: °-Drive a car °-Operate machinery °-Drink alcoholic  beverages °-Take any medication unless instructed by your physician °-Make any legal decisions or sign important papers. ° °Meals: °Start with liquid foods such as gelatin or soup. Progress to regular foods as tolerated. Avoid greasy, spicy, heavy foods. If nausea and/or vomiting occur, drink only clear liquids until the nausea and/or vomiting subsides. Call your physician if vomiting continues. ° °Special Instructions/Symptoms: °Your throat may feel dry or sore from the anesthesia or the breathing tube placed in your throat during surgery. If this causes discomfort, gargle with warm salt water. The discomfort should disappear within 24 hours. ° °If you had a scopolamine patch placed behind your ear for the management of post- operative nausea and/or vomiting: ° °1. The medication in the patch is effective for 72 hours, after which it should be removed.  Wrap patch in a tissue and discard in the trash. Wash hands thoroughly with soap and water. °2. You may remove the patch earlier than 72 hours if you experience unpleasant side effects which may include dry mouth, dizziness or visual disturbances. °3. Avoid touching the patch. Wash your hands with soap and water after contact with the patch. °  ° °

## 2016-10-05 NOTE — Op Note (Signed)
Cacy Phin April 28, 1961 [id number]   Post Operative Note   Date of surgery:  10/05/2016  Pre Op Dx:  Irregular bleeding, endometrial polyp  Post Op Dx:  Irregular bleeding, endometrial polyps, septate uterus  Procedure:  Hysteroscopy, D&C, Myosure resection endometrial polyps  Surgeon:  Donalynn Furlong P  Anesthesia:  General  EBL:  Minimal  Distended media discrepancy:  123XX123 cc saline  Complications:  None  Specimen:  #1 endometrial polyps #2 endometrial curetting to pathology  Findings: EUA:  External BUS vagina normal with atrophic changes. Cervix normal. Uterus unable to palpate but no gross pelvic masses.   Hysteroscopy:  Adequate noting septated uterine pattern to mid cavity with right and left cavities fully visualized. Right and left tubal ostia identified. Endometrial polyp noted in left fundal anterior endometrial surface below tubal ostia. Second polyp noted posterior at the base of the septum. No other abnormalities noted.  Procedure:  The patient was taken to the operating room, was placed in the low dorsal lithotomy position, underwent general anesthesia, received a perineal/vaginal preparation with Betadine solution per nursing personnel and the bladder was emptied with in and out Foley catheterization. The timeout was performed by the surgical team. An EUA was performed. The patient was draped in the usual fashion. The cervix was visualized with a speculum, anterior lip grasped with a single-tooth tenaculum and a paracervical block was placed using 10 cc's of 1% lidocaine. The cervix was gently dilated to admit the Myosure hysteroscope and hysteroscopy was performed with findings noted above. Using the Myosure Lite resectoscopic wand the polyps were resected in their entirety to the level the surrounding endometrium. A gentle sharp curettage was performed. Both specimens were sent separately to pathology.  Repeat hysteroscopy showed an empty cavity with good distention and  no evidence of perforation. The instruments were removed and adequate hemostasis was visualized at the tenaculum site and external cervical os. The patient was given intraoperative Toradol, was awakened without difficulty and was taken to the recovery room in good condition having tolerated the procedure well.    Anastasio Auerbach MD, 9:27 AM 10/05/2016

## 2016-10-05 NOTE — Transfer of Care (Signed)
Immediate Anesthesia Transfer of Care Note  Patient: Monica Solis  Procedure(s) Performed: Procedure(s) with comments: DILATATION & CURETTAGE/HYSTEROSCOPY WITH MYOSURE (N/A) - requests to follow 7:30am case  requests one hour OR time  Patient Location: PACU  Anesthesia Type:General  Level of Consciousness: awake, alert  and oriented  Airway & Oxygen Therapy: Patient Spontanous Breathing and Patient connected to nasal cannula oxygen  Post-op Assessment: Report given to RN  Post vital signs: Reviewed and stable  Last Vitals:  Vitals:   10/05/16 0635 10/05/16 0931  BP: (!) 156/86 (!) (P) 107/53  Pulse: 69 (P) 81  Resp: 18   Temp: 36.9 C (P) 36.8 C    Last Pain:  Vitals:   10/05/16 0704  TempSrc:   PainSc: 4       Patients Stated Pain Goal: 10 ([id number])  Complications: No apparent anesthesia complications

## 2016-10-05 NOTE — Anesthesia Preprocedure Evaluation (Signed)
Anesthesia Evaluation  Patient identified by MRN, date of birth, ID band Patient awake    Reviewed: Allergy & Precautions, NPO status , Patient's Chart, lab work & pertinent test results  History of Anesthesia Complications Negative for: history of anesthetic complications  Airway Mallampati: II  TM Distance: >3 FB Neck ROM: Full    Dental  (+) Edentulous Upper, Edentulous Lower   Pulmonary neg shortness of breath, neg COPD, neg recent URI, former smoker,    breath sounds clear to auscultation       Cardiovascular hypertension, Pt. on medications (-) angina(-) Past MI and (-) CHF  Rhythm:Regular     Neuro/Psych  Neuromuscular disease negative psych ROS   GI/Hepatic Neg liver ROS, GERD  Medicated and Controlled,  Endo/Other  diabetes, Type 2, Insulin DependentHypothyroidism Morbid obesity  Renal/GU negative Renal ROS     Musculoskeletal   Abdominal   Peds  Hematology   Anesthesia Other Findings   Reproductive/Obstetrics                             Anesthesia Physical Anesthesia Plan  ASA: III  Anesthesia Plan: General   Post-op Pain Management:    Induction: Intravenous  Airway Management Planned: LMA and Oral ETT  Additional Equipment: None  Intra-op Plan:   Post-operative Plan: Extubation in OR  Informed Consent: I have reviewed the patients History and Physical, chart, labs and discussed the procedure including the risks, benefits and alternatives for the proposed anesthesia with the patient or authorized representative who has indicated his/her understanding and acceptance.   Dental advisory given  Plan Discussed with: CRNA and Surgeon  Anesthesia Plan Comments:         Anesthesia Quick Evaluation

## 2016-10-05 NOTE — Anesthesia Procedure Notes (Signed)
Procedure Name: LMA Insertion Date/Time: 10/05/2016 9:03 AM Performed by: Bethena Roys T Pre-anesthesia Checklist: Patient identified, Emergency Drugs available, Suction available and Patient being monitored Patient Re-evaluated:Patient Re-evaluated prior to inductionOxygen Delivery Method: Circle System Utilized Preoxygenation: Pre-oxygenation with 100% oxygen Intubation Type: IV induction Ventilation: Mask ventilation without difficulty LMA: LMA with gastric port inserted LMA Size: 5.0 Number of attempts: 1 Placement Confirmation: positive ETCO2 Tube secured with: Tape Dental Injury: Teeth and Oropharynx as per pre-operative assessment

## 2016-10-05 NOTE — H&P (Signed)
  The patient was examined.  I reviewed the proposed surgery and consent form with the patient.  The dictated history and physical is current and accurate and all questions were answered. The patient is ready to proceed with surgery and has a realistic understanding and expectation for the outcome.   Anastasio Auerbach MD, 7:19 AM 10/05/2016

## 2016-10-06 NOTE — Anesthesia Postprocedure Evaluation (Signed)
Anesthesia Post Note  Patient: Monica Solis  Procedure(s) Performed: Procedure(s) (LRB): DILATATION & CURETTAGE/HYSTEROSCOPY WITH MYOSURE (N/A)  Patient location during evaluation: PACU Anesthesia Type: General Level of consciousness: awake and alert Pain management: pain level controlled Vital Signs Assessment: post-procedure vital signs reviewed and stable Respiratory status: spontaneous breathing, nonlabored ventilation, respiratory function stable and patient connected to nasal cannula oxygen Cardiovascular status: blood pressure returned to baseline and stable Postop Assessment: no signs of nausea or vomiting Anesthetic complications: no       Last Vitals:  Vitals:   10/05/16 1000 10/05/16 1043  BP: (!) 102/42 131/77  Pulse: 72 72  Resp: (!) 21 20  Temp:  36.8 C    Last Pain:  Vitals:   10/05/16 1043  TempSrc: Tympanic  PainSc: 0-No pain                 Anthonio Mizzell

## 2016-10-09 ENCOUNTER — Encounter (HOSPITAL_BASED_OUTPATIENT_CLINIC_OR_DEPARTMENT_OTHER): Payer: Self-pay | Admitting: Gynecology

## 2016-10-18 ENCOUNTER — Ambulatory Visit (HOSPITAL_COMMUNITY)
Admission: RE | Admit: 2016-10-18 | Discharge: 2016-10-18 | Disposition: A | Discharge status: Home / Self Care | Payer: 59 | Source: Ambulatory Visit | Attending: General Surgery | Admitting: General Surgery

## 2016-10-18 ENCOUNTER — Other Ambulatory Visit: Payer: Self-pay

## 2016-10-18 DIAGNOSIS — Z01810 Encounter for preprocedural cardiovascular examination: Secondary | ICD-10-CM | POA: Insufficient documentation

## 2016-10-18 DIAGNOSIS — Z01818 Encounter for other preprocedural examination: Secondary | ICD-10-CM | POA: Diagnosis not present

## 2016-10-19 ENCOUNTER — Ambulatory Visit: Payer: 59 | Admitting: Gynecology

## 2016-10-19 DIAGNOSIS — Z719 Counseling, unspecified: Secondary | ICD-10-CM | POA: Diagnosis not present

## 2016-10-22 ENCOUNTER — Encounter: Payer: 59 | Attending: General Surgery | Admitting: Dietician

## 2016-10-22 DIAGNOSIS — K219 Gastro-esophageal reflux disease without esophagitis: Secondary | ICD-10-CM | POA: Diagnosis not present

## 2016-10-22 DIAGNOSIS — Z7982 Long term (current) use of aspirin: Secondary | ICD-10-CM | POA: Diagnosis not present

## 2016-10-22 DIAGNOSIS — E119 Type 2 diabetes mellitus without complications: Secondary | ICD-10-CM | POA: Insufficient documentation

## 2016-10-22 DIAGNOSIS — I10 Essential (primary) hypertension: Secondary | ICD-10-CM | POA: Insufficient documentation

## 2016-10-22 DIAGNOSIS — D6851 Activated protein C resistance: Secondary | ICD-10-CM | POA: Diagnosis not present

## 2016-10-22 DIAGNOSIS — Z794 Long term (current) use of insulin: Secondary | ICD-10-CM | POA: Diagnosis not present

## 2016-10-22 DIAGNOSIS — Z713 Dietary counseling and surveillance: Secondary | ICD-10-CM | POA: Insufficient documentation

## 2016-10-22 DIAGNOSIS — Z833 Family history of diabetes mellitus: Secondary | ICD-10-CM | POA: Insufficient documentation

## 2016-10-22 DIAGNOSIS — Z8261 Family history of arthritis: Secondary | ICD-10-CM | POA: Insufficient documentation

## 2016-10-22 DIAGNOSIS — Z8249 Family history of ischemic heart disease and other diseases of the circulatory system: Secondary | ICD-10-CM | POA: Insufficient documentation

## 2016-10-22 DIAGNOSIS — E782 Mixed hyperlipidemia: Secondary | ICD-10-CM | POA: Insufficient documentation

## 2016-10-22 DIAGNOSIS — Z79899 Other long term (current) drug therapy: Secondary | ICD-10-CM | POA: Diagnosis not present

## 2016-10-22 DIAGNOSIS — Z888 Allergy status to other drugs, medicaments and biological substances status: Secondary | ICD-10-CM | POA: Insufficient documentation

## 2016-10-22 NOTE — Progress Notes (Signed)
  Pre-Op Assessment Visit:  Pre-Operative RYGB Surgery  Medical Nutrition Therapy:  Appt start time: [id number]   End time:  1130.  Patient was seen on 10/23/2015 for Pre-Operative Nutrition Assessment. Assessment and letter of approval faxed to [facility] Program coordinator on 10/22/2016.   Preferred Learning Style:   No preference indicated   Learning Readiness:   Ready  Handouts given during visit include:  Pre-Op Goals Bariatric Surgery Protein Shakes   During the appointment today the following Pre-Op Goals were reviewed with the patient: Maintain or lose weight as instructed by your surgeon Make healthy food choices Begin to limit portion sizes Limited concentrated sugars and fried foods Keep fat/sugar in the single digits per serving on   food labels Practice CHEWING your food  (aim for 30 chews per bite or until applesauce consistency) Practice not drinking 15 minutes before, during, and 30 minutes after each meal/snack Avoid all carbonated beverages  Avoid/limit caffeinated beverages  Avoid all sugar-sweetened beverages Consume 3 meals per day; eat every 3-5 hours Make a list of non-food related activities Aim for 64-100 ounces of FLUID daily  Aim for at least 60-80 grams of PROTEIN daily Look for a liquid protein source that contain ?15 g protein and ?5 g carbohydrate  (ex: shakes, drinks, shots)  Patient-Centered Goals: Would like to get healthier (has a lot of health problems).  10/10 confidence/importance scale   Demonstrated degree of understanding via:  Teach Back  Teaching Method Utilized:  Visual Auditory Hands on  Barriers to learning/adherence to lifestyle change: none  Patient to call the [facility] to enroll in Pre-Op and Post-Op Nutrition Education when surgery date is scheduled.

## 2016-10-22 NOTE — Patient Instructions (Signed)
Follow Pre-Op Goals Try Protein Shakes Call [facility] at [phone] when surgery is scheduled to enroll in Pre-Op Class  Things to remember:  Please always be honest with us. We want to support you!  If you have any questions or concerns in between appointments, please call or email Liz, Leslie, or Laurie.  The diet after surgery will be high protein and low in carbohydrate.  Vitamins and calcium need to be taken for the rest of your life.  Feel free to include support people in any classes or appointments.   Supplement recommendations:  Before Surgery   1 Complete Multivitamin with Iron  3000 IU Vitamin D3  After Surgery   2 Chewable Multivitamins  **Best Choice - Bariatric Advantage Advanced Multi EA      3 Chewable Calcium (500 mg each, total 1200-1500 mg per day)  **Best Choice - Celebrate, Bariatric Advantage, or Wellesse  Other Options:    2 Flinstones Complete + up to 100 mg Thiamin + 2000-3000 IU Vitamin D3 + 350-500 mcg Vitamin B12 + 30-45 mg Iron (with history of deficiency)  2 Celebrate MultiComplete with 18 mg Iron (this provides 6000 IU of  Vitamin D3)  4 Celebrate Essential Multi 2 in 1 (has calcium) + 18-60 mg separate  iron  Vitamins and Calcium are available at:   [facility]   [address], [place], [place] [address]   [email or web address]  [email or web address]  www.[email or web address]   

## 2016-10-26 DIAGNOSIS — Z719 Counseling, unspecified: Secondary | ICD-10-CM | POA: Diagnosis not present

## 2016-11-02 DIAGNOSIS — Z719 Counseling, unspecified: Secondary | ICD-10-CM | POA: Diagnosis not present

## 2016-11-09 ENCOUNTER — Ambulatory Visit (INDEPENDENT_AMBULATORY_CARE_PROVIDER_SITE_OTHER): Payer: 59 | Admitting: Cardiovascular Disease

## 2016-11-09 ENCOUNTER — Encounter: Payer: Self-pay | Admitting: Cardiovascular Disease

## 2016-11-09 VITALS — BP 126/70 | HR 92 | Ht 68.0 in | Wt 323.0 lb

## 2016-11-09 DIAGNOSIS — Z01810 Encounter for preprocedural cardiovascular examination: Secondary | ICD-10-CM

## 2016-11-09 DIAGNOSIS — I447 Left bundle-branch block, unspecified: Secondary | ICD-10-CM

## 2016-11-09 DIAGNOSIS — Z719 Counseling, unspecified: Secondary | ICD-10-CM | POA: Diagnosis not present

## 2016-11-09 NOTE — Assessment & Plan Note (Signed)
History of hyperlipidemia not on statin therapy. 

## 2016-11-09 NOTE — Assessment & Plan Note (Addendum)
History of hypertension blood pressure measured at 126/70. She is on Avapro. Continued current meds at current dosing

## 2016-11-09 NOTE — Progress Notes (Signed)
11/09/2016 Monica Solis   January 14, 1961  [id number]  Primary Physician Monica Carwin, PA-C Primary Cardiologist: Monica Harp MD Monica Solis  HPI:  Monica Solis is a 56 year old severely overweight married Caucasian female with no children who was referred by her general surgeon, Dr. Kieth Solis for preoperative coronary vessel clearance prior to elective Roux-en-Y bariatric surgery. Her risk factors for heart disease include treated diabetes, and hypertension. She does have a strong family history of heart disease with a father who died at age 66 of a myocardial infarction and brother at age 38. She has never had a heart attack or stroke. She denies chest pain or shortness of breath. She does have factor V Leiden deficiency has had DVT and pulmonary emboli in the past (2011).   Current Outpatient Prescriptions  Medication Sig Dispense Refill  . aspirin EC 325 MG tablet Take 325 mg by mouth daily.    . Cholecalciferol (VITAMIN D3) 2000 UNITS capsule Take 4,000 Units by mouth daily.     . fexofenadine-pseudoephedrine (ALLEGRA-D) 60-120 MG 12 hr tablet Take 1 tablet by mouth 2 (two) times daily as needed.    . gabapentin (NEURONTIN) 100 MG capsule Take 100 mg by mouth 3 (three) times daily.    . insulin aspart (NOVOLOG) 100 UNIT/ML injection Inject into the skin 3 (three) times daily as needed. Per SSC 7-10units when cbg above 120    . insulin detemir (LEVEMIR) 100 UNIT/ML injection Inject 36 Units into the skin at bedtime.     . irbesartan (AVAPRO) 300 MG tablet Take 300 mg by mouth every morning.     . Iron-Vitamins (GERITOL COMPLETE) TABS Take 1 tablet by mouth daily.    [name] levothyroxine (SYNTHROID, LEVOTHROID) 50 MCG tablet Take 50 mcg by mouth daily before breakfast. Monday-Saturday one tablet/  On Sunday's 2 tablets    . omeprazole (PRILOSEC) 40 MG capsule Take 40 mg by mouth every evening.    . Probiotic Product (PROBIOTIC DAILY) CAPS Take 1 capsule by mouth daily.     No  current facility-administered medications for this visit.     Allergies  Allergen Reactions  . Benadryl [Diphenhydramine Hcl] Hives  . Diphenhydramine Hcl Hives  . Invokana [Canagliflozin] Other (See Comments)    Loss of bladder control   . Metformin Other (See Comments)    Kidney damage  . Metformin And Related Other (See Comments)    Decreased kidney function  . Pravastatin Other (See Comments)    Muscle ache  . Tramadol Other (See Comments)    "nightmares"  . Victoza [Liraglutide] Other (See Comments)    "Fainting"    Social History   Social History  . Marital status: Married    Spouse name: N/A  . Number of children: N/A  . Years of education: N/A   Occupational History  . Not on file.   Social History Main Topics  . Smoking status: Former Smoker    Packs/day: 0.00    Years: 2.00    Quit date: 10/02/1979  . Smokeless tobacco: Never Used  . Alcohol use No  . Drug use: No  . Sexual activity: Not on file     Comment: 1st intercourse 32 yo-1 partner-BTL   Other Topics Concern  . Not on file   Social History Narrative  . No narrative on file     Review of Systems: General: negative for chills, fever, night sweats or weight changes.  Cardiovascular: negative for chest pain, dyspnea  on exertion, edema, orthopnea, palpitations, paroxysmal nocturnal dyspnea or shortness of breath Dermatological: negative for rash Respiratory: negative for cough or wheezing Urologic: negative for hematuria Abdominal: negative for nausea, vomiting, diarrhea, bright red blood per rectum, melena, or hematemesis Neurologic: negative for visual changes, syncope, or dizziness All other systems reviewed and are otherwise negative except as noted above.    Blood pressure 126/70, pulse 92, height 5\' 8"  (1.727 m), weight (!) 323 lb (146.5 kg).  General appearance: alert and no distress Neck: no adenopathy, no carotid bruit, no JVD, supple, symmetrical, trachea midline and thyroid not  enlarged, symmetric, no tenderness/mass/nodules Lungs: clear to auscultation bilaterally Heart: regular rate and rhythm, S1, S2 normal, no murmur, click, rub or gallop Extremities: extremities normal, atraumatic, no cyanosis or edema  EKG not performed today. EKG performed 10/18/16 showed sinus rhythm at 78 with left bundle branch block which I personally reviewed.  ASSESSMENT AND PLAN:   Essential hypertension History of hypertension blood pressure measured at 126/70. She is on Avapro. Continued current meds at current dosing  Hyperlipidemia History of hyperlipidemia not on statin therapy  BBB (bundle branch block) Chronic      Monica Harp MD [facility], [facility] 11/09/2016 10:45 AM

## 2016-11-09 NOTE — Patient Instructions (Signed)
Medication Instructions: Your physician recommends that you continue on your current medications as directed. Please refer to the Current Medication list given to you today.   Testing/Procedures: Your physician has requested that you have an echocardiogram. Echocardiography is a painless test that uses sound waves to create images of your heart. It provides your doctor with information about the size and shape of your heart and how well your heart's chambers and valves are working. This procedure takes approximately one hour. There are no restrictions for this procedure.  Your physician has requested that you have a lexiscan myoview. For further information please visit [email or web address]. Please follow instruction sheet, as given.   Follow-Up: Your physician recommends that you schedule a follow-up appointment as needed with Dr. Berry.  If you need a refill on your cardiac medications before your next appointment, please call your pharmacy.  

## 2016-11-09 NOTE — Assessment & Plan Note (Signed)
Chronic. 

## 2016-11-15 DIAGNOSIS — I10 Essential (primary) hypertension: Secondary | ICD-10-CM | POA: Diagnosis not present

## 2016-11-15 DIAGNOSIS — K210 Gastro-esophageal reflux disease with esophagitis: Secondary | ICD-10-CM | POA: Diagnosis not present

## 2016-11-19 DIAGNOSIS — E785 Hyperlipidemia, unspecified: Secondary | ICD-10-CM | POA: Diagnosis not present

## 2016-11-19 DIAGNOSIS — Z794 Long term (current) use of insulin: Secondary | ICD-10-CM | POA: Diagnosis not present

## 2016-11-19 DIAGNOSIS — E1129 Type 2 diabetes mellitus with other diabetic kidney complication: Secondary | ICD-10-CM | POA: Diagnosis not present

## 2016-11-19 DIAGNOSIS — R809 Proteinuria, unspecified: Secondary | ICD-10-CM | POA: Diagnosis not present

## 2016-11-21 ENCOUNTER — Ambulatory Visit: Payer: 59 | Admitting: Skilled Nursing Facility1

## 2016-11-23 ENCOUNTER — Telehealth (HOSPITAL_COMMUNITY): Payer: Self-pay

## 2016-11-23 NOTE — Telephone Encounter (Signed)
Encounter complete. 

## 2016-11-28 ENCOUNTER — Ambulatory Visit (HOSPITAL_COMMUNITY)
Admission: RE | Admit: 2016-11-28 | Discharge: 2016-11-28 | Disposition: A | Discharge status: Home / Self Care | Payer: 59 | Source: Ambulatory Visit | Attending: Cardiology | Admitting: Cardiology

## 2016-11-28 DIAGNOSIS — I447 Left bundle-branch block, unspecified: Secondary | ICD-10-CM | POA: Insufficient documentation

## 2016-11-28 DIAGNOSIS — Z01810 Encounter for preprocedural cardiovascular examination: Secondary | ICD-10-CM | POA: Insufficient documentation

## 2016-11-28 DIAGNOSIS — I5189 Other ill-defined heart diseases: Secondary | ICD-10-CM | POA: Diagnosis not present

## 2016-11-28 MED ORDER — TECHNETIUM TC 99M TETROFOSMIN IV KIT
30.5000 | PACK | Freq: Once | INTRAVENOUS | Status: AC | PRN
  Administered 2016-11-28: 30.5 via INTRAVENOUS
  Filled 2016-11-28: qty 31

## 2016-11-28 MED ORDER — REGADENOSON 0.4 MG/5ML IV SOLN
0.4000 mg | Freq: Once | INTRAVENOUS | Status: AC
  Administered 2016-11-28: 0.4 mg via INTRAVENOUS

## 2016-11-29 ENCOUNTER — Ambulatory Visit (HOSPITAL_COMMUNITY)
Admission: RE | Admit: 2016-11-29 | Discharge: 2016-11-29 | Disposition: A | Discharge status: Home / Self Care | Payer: 59 | Source: Ambulatory Visit | Attending: Cardiovascular Disease | Admitting: Cardiovascular Disease

## 2016-11-29 LAB — MYOCARDIAL PERFUSION IMAGING
CHL CUP RESTING HR STRESS: 83 {beats}/min
CSEPPHR: 95 {beats}/min
LV dias vol: 213 mL (ref 46–106)
LV sys vol: 139 mL
SDS: 4
SRS: 0
SSS: 4
TID: 0.92

## 2016-11-29 MED ORDER — TECHNETIUM TC 99M TETROFOSMIN IV KIT: 32.4000 | PACK | Freq: Once | INTRAVENOUS | Status: AC | PRN

## 2016-11-30 DIAGNOSIS — Z719 Counseling, unspecified: Secondary | ICD-10-CM | POA: Diagnosis not present

## 2016-12-03 ENCOUNTER — Encounter (HOSPITAL_COMMUNITY): Payer: Self-pay | Admitting: *Deleted

## 2016-12-03 ENCOUNTER — Ambulatory Visit (HOSPITAL_COMMUNITY): Payer: 59 | Attending: Cardiology

## 2016-12-03 DIAGNOSIS — I081 Rheumatic disorders of both mitral and tricuspid valves: Secondary | ICD-10-CM | POA: Diagnosis not present

## 2016-12-03 DIAGNOSIS — Z01810 Encounter for preprocedural cardiovascular examination: Secondary | ICD-10-CM

## 2016-12-03 DIAGNOSIS — I447 Left bundle-branch block, unspecified: Secondary | ICD-10-CM | POA: Insufficient documentation

## 2016-12-03 NOTE — Progress Notes (Unsigned)
2D Echo Performed.  EF decreased, around 30-35% (Also seen with Nuclear Stress exam from last week).  Patient is stable and does not complain of any symptoms at the time of exam.  Patient is released to follow up with Dr. Gwenlyn Found.    Deliah Boston, RDCS

## 2016-12-07 DIAGNOSIS — Z719 Counseling, unspecified: Secondary | ICD-10-CM | POA: Diagnosis not present

## 2016-12-12 ENCOUNTER — Encounter: Payer: 59 | Attending: General Surgery | Admitting: Registered"

## 2016-12-12 DIAGNOSIS — E782 Mixed hyperlipidemia: Secondary | ICD-10-CM | POA: Diagnosis not present

## 2016-12-12 DIAGNOSIS — IMO0001 Reserved for inherently not codable concepts without codable children: Secondary | ICD-10-CM

## 2016-12-12 DIAGNOSIS — I10 Essential (primary) hypertension: Secondary | ICD-10-CM | POA: Insufficient documentation

## 2016-12-12 DIAGNOSIS — K219 Gastro-esophageal reflux disease without esophagitis: Secondary | ICD-10-CM | POA: Insufficient documentation

## 2016-12-12 DIAGNOSIS — Z833 Family history of diabetes mellitus: Secondary | ICD-10-CM | POA: Insufficient documentation

## 2016-12-12 DIAGNOSIS — Z7982 Long term (current) use of aspirin: Secondary | ICD-10-CM | POA: Insufficient documentation

## 2016-12-12 DIAGNOSIS — Z79899 Other long term (current) drug therapy: Secondary | ICD-10-CM | POA: Insufficient documentation

## 2016-12-12 DIAGNOSIS — Z8261 Family history of arthritis: Secondary | ICD-10-CM | POA: Diagnosis not present

## 2016-12-12 DIAGNOSIS — Z794 Long term (current) use of insulin: Secondary | ICD-10-CM | POA: Diagnosis not present

## 2016-12-12 DIAGNOSIS — Z888 Allergy status to other drugs, medicaments and biological substances status: Secondary | ICD-10-CM | POA: Insufficient documentation

## 2016-12-12 DIAGNOSIS — Z713 Dietary counseling and surveillance: Secondary | ICD-10-CM | POA: Insufficient documentation

## 2016-12-12 DIAGNOSIS — E119 Type 2 diabetes mellitus without complications: Secondary | ICD-10-CM | POA: Diagnosis not present

## 2016-12-12 DIAGNOSIS — D6851 Activated protein C resistance: Secondary | ICD-10-CM | POA: Insufficient documentation

## 2016-12-12 DIAGNOSIS — Z8249 Family history of ischemic heart disease and other diseases of the circulatory system: Secondary | ICD-10-CM | POA: Diagnosis not present

## 2016-12-12 NOTE — Progress Notes (Signed)
Supervised Weight Loss:  Appt start time: 0815  End time:  0830.  Patient states she does not want to have foods with artificial sweeteners because they tear her stomach up and cause diarrhea. She also avoids foods that will flare her diverticulitis such as seeds and skins.  Surgery type: RYGB Start weight at [facility]: 322.4 lbs on Oct 22, 2016 Weight today: 320.8 lbs Weight change from start: 1.6 lb weight loss  24 hr Dietary Recall: First Meal: scrambled 2 eggs, toast, 2 bacon, water Snack: none Second Meal: chicken salad sandwich, lettuce, white bread Snack: (3 pm) banana Third Meal: grilled chicken, tossed salad, green beans Snack: none  The following learning objectives were met by the patient during this visit:  Objectives: -Discuss masticating foods to appropriate consistency  -Helps food fit in pouch and prevents pain and vomiting  -Eating slowly and taking tiny bites help brain register fullness -Encourage patients to begin to think about what diet will be like after surgery -Encourage patients to begin to consider portion sizes and listen to hunger/fullness cues -Review importance of regular meals and snacks  Goals: -Begin limiting fried and other high fat foods and foods high in sugar -Eat proteins first, then vegetables, then starch -Practice chewing foods to applesauce consistency -Aim to spend 20 minutes eating meals -Work on eating 3 meals a day (or every 3-5 hours) if you are not already doing so  Energy and Macronutrient Recomendations: Calories: 1600 Carbohydrate: 180 g Protein: 120 g Fat: 44 g  Handouts given: Mindful Eating brochure

## 2016-12-18 ENCOUNTER — Encounter: Payer: Self-pay | Admitting: Cardiovascular Disease

## 2016-12-18 ENCOUNTER — Ambulatory Visit (INDEPENDENT_AMBULATORY_CARE_PROVIDER_SITE_OTHER): Payer: 59 | Admitting: Cardiovascular Disease

## 2016-12-18 VITALS — BP 126/74 | HR 83 | Ht 69.0 in | Wt 319.2 lb

## 2016-12-18 DIAGNOSIS — I519 Heart disease, unspecified: Secondary | ICD-10-CM | POA: Diagnosis not present

## 2016-12-18 DIAGNOSIS — R079 Chest pain, unspecified: Secondary | ICD-10-CM

## 2016-12-18 DIAGNOSIS — I10 Essential (primary) hypertension: Secondary | ICD-10-CM | POA: Diagnosis not present

## 2016-12-18 NOTE — Progress Notes (Signed)
12/18/2016 Oreal Tikkanen   [birth year]  [id number]  Primary Physician Shellia Carwin, PA-C Primary Cardiologist: Lorretta Harp MD Renae Gloss  HPI:   Ms. Ferrandino is a 56 year old severely overweight married Caucasian female with no children who was referred by her general surgeon, Dr. Kieth Brightly for preoperative coronary vessel clearance prior to elective Roux-en-Y bariatric surgery. I last saw her in the office 11/09/16. Her risk factors for heart disease include treated diabetes, and hypertension. She does have a strong family history of heart disease with a father who died at age 44 of a myocardial infarction and brother at age 3. She has never had a heart attack or stroke. She denies chest pain or shortness of breath. She does have factor V Leiden deficiency has had DVT and pulmonary emboli in the past (2011). I performed a pharmacologic Myoview stress test that showed no evidence of ischemia but gated SPECT showed an ejection fraction of 35%. This was confirmed by 2-D echocardiography. She does now admit to episodes of chest burning which she has thought was reflux in the past.   Current Outpatient Prescriptions  Medication Sig Dispense Refill  . aspirin EC 325 MG tablet Take 325 mg by mouth daily.    . Cholecalciferol (VITAMIN D3) 2000 UNITS capsule Take 4,000 Units by mouth daily.     . fexofenadine-pseudoephedrine (ALLEGRA-D) 60-120 MG 12 hr tablet Take 1 tablet by mouth 2 (two) times daily as needed.    . gabapentin (NEURONTIN) 100 MG capsule Take 100 mg by mouth 3 (three) times daily.    . insulin aspart (NOVOLOG) 100 UNIT/ML injection Inject into the skin 3 (three) times daily as needed. Per SSC 7-10units when cbg above 120    . insulin detemir (LEVEMIR) 100 UNIT/ML injection Inject 36 Units into the skin at bedtime.     . irbesartan (AVAPRO) 300 MG tablet Take 300 mg by mouth every morning.     . Iron-Vitamins (GERITOL COMPLETE) TABS Take 1 tablet by mouth daily.    [name]  levothyroxine (SYNTHROID, LEVOTHROID) 50 MCG tablet Take 50 mcg by mouth daily before breakfast. Monday-Saturday one tablet/  On Sunday's 2 tablets    . omeprazole (PRILOSEC) 40 MG capsule Take 40 mg by mouth every evening.    . Probiotic Product (PROBIOTIC DAILY) CAPS Take 1 capsule by mouth daily.     No current facility-administered medications for this visit.    Facility-Administered Medications Ordered in Other Visits  Medication Dose Route Frequency Provider Last Rate Last Dose  . technetium tetrofosmin (TC-MYOVIEW) injection [id number] millicurie  [id number] millicurie Intravenous Once PRN Pixie Casino, MD        Allergies  Allergen Reactions  . Benadryl [Diphenhydramine Hcl] Hives  . Diphenhydramine Hcl Hives  . Invokana [Canagliflozin] Other (See Comments)    Loss of bladder control   . Metformin Other (See Comments)    Kidney damage  . Metformin And Related Other (See Comments)    Decreased kidney function  . Pravastatin Other (See Comments)    Muscle ache  . Tramadol Other (See Comments)    "nightmares"  . Victoza [Liraglutide] Other (See Comments)    "Fainting"    Social History   Social History  . Marital status: Married    Spouse name: N/A  . Number of children: N/A  . Years of education: N/A   Occupational History  . Not on file.   Social History Main Topics  . Smoking status: Former  Smoker    Packs/day: 0.00    Years: 2.00    Quit date: 10/02/1979  . Smokeless tobacco: Never Used  . Alcohol use No  . Drug use: No  . Sexual activity: Not on file     Comment: 1st intercourse 32 yo-1 partner-BTL   Other Topics Concern  . Not on file   Social History Narrative  . No narrative on file     Review of Systems: General: negative for chills, fever, night sweats or weight changes.  Cardiovascular: negative for chest pain, dyspnea on exertion, edema, orthopnea, palpitations, paroxysmal nocturnal dyspnea or shortness of breath Dermatological: negative for  rash Respiratory: negative for cough or wheezing Urologic: negative for hematuria Abdominal: negative for nausea, vomiting, diarrhea, bright red blood per rectum, melena, or hematemesis Neurologic: negative for visual changes, syncope, or dizziness All other systems reviewed and are otherwise negative except as noted above.    Blood pressure 126/74, pulse 83, height 5\' 9"  (1.753 m), weight (!) 319 lb 3.2 oz (144.8 kg).  General appearance: alert and no distress Neck: no adenopathy, no carotid bruit, no JVD, supple, symmetrical, trachea midline and thyroid not enlarged, symmetric, no tenderness/mass/nodules Lungs: clear to auscultation bilaterally Heart: regular rate and rhythm, S1, S2 normal, no murmur, click, rub or gallop Extremities: extremities normal, atraumatic, no cyanosis or edema  EKG not performed today  ASSESSMENT AND PLAN:   Left ventricular dysfunction Ms. Cox had a recent Myoview and 2-D echo which showed no ischemia but an EF of 30-35%. This was an unexpected finding. She does relate episodes of chest burning radiating to her back. Risk factors include family history, hypertension, diabetes and hyperlipidemia. At this point, I cannot clear her for her possible Roux-en-Y bariatric surgical procedure. I think we should proceed with outpatient radial heart cath to define her anatomy to rule out an ischemic etiology. Certainly possible she has "bounced ischemia".The patient understands that risks included but are not limited to stroke (1 in 1000), death (1 in 47), kidney failure [usually temporary] (1 in 500), bleeding (1 in 200), allergic reaction [possibly serious] (1 in 200). The patient understands and agrees to proceed      Lorretta Harp MD [facility], [facility] 12/18/2016 2:11 PM

## 2016-12-18 NOTE — Assessment & Plan Note (Signed)
Ms. Monica Solis had a recent Myoview and 2-D echo which showed no ischemia but an EF of 30-35%. This was an unexpected finding. She does relate episodes of chest burning radiating to her back. Risk factors include family history, hypertension, diabetes and hyperlipidemia. At this point, I cannot clear her for her possible Roux-en-Y bariatric surgical procedure. I think we should proceed with outpatient radial heart cath to define her anatomy to rule out an ischemic etiology. Certainly possible she has "bounced ischemia".The patient understands that risks included but are not limited to stroke (1 in 1000), death (1 in 63), kidney failure [usually temporary] (1 in 500), bleeding (1 in 200), allergic reaction [possibly serious] (1 in 200). The patient understands and agrees to proceed

## 2016-12-18 NOTE — Patient Instructions (Addendum)
Your physician has requested that you have a cardiac catheterization on 01/03/17 with Dr. Gwenlyn Found (Dx: Chest pain). Cardiac catheterization is used to diagnose and/or treat various heart conditions. Doctors may recommend this procedure for a number of different reasons. The most common reason is to evaluate chest pain. Chest pain can be a symptom of coronary artery disease (CAD), and cardiac catheterization can show whether plaque is narrowing or blocking your heart's arteries. This procedure is also used to evaluate the valves, as well as measure the blood flow and oxygen levels in different parts of your heart. For further information please visit [email or web address].   Following your catheterization, you will not be allowed to drive for 3 days.  No lifting, pushing, or pulling greater that 10 pounds is allowed for 1 week.  You will be required to have the following tests prior to the procedure:  1. Blood work-the blood work can be done no more than 14 days prior to the procedure.  It can be done at any [facility] lab.  There is one downstairs on the first floor of this building and one in the [facility] building [phone]).  Puncture site Rt Radial

## 2016-12-24 ENCOUNTER — Other Ambulatory Visit: Payer: Self-pay | Admitting: Cardiovascular Disease

## 2016-12-24 DIAGNOSIS — R079 Chest pain, unspecified: Secondary | ICD-10-CM

## 2016-12-27 LAB — CBC WITH DIFFERENTIAL/PLATELET
Basophils Absolute: 0 cells/uL (ref 0–200)
Basophils Relative: 0 %
EOS ABS: 102 {cells}/uL (ref 15–500)
EOS PCT: 2 %
HCT: 45.9 % — ABNORMAL HIGH (ref 35.0–45.0)
Hemoglobin: 14.8 g/dL (ref 11.7–15.5)
LYMPHS ABS: 1428 {cells}/uL (ref 850–3900)
Lymphocytes Relative: 28 %
MCH: 29.5 pg (ref 27.0–33.0)
MCHC: 32.2 g/dL (ref 32.0–36.0)
MCV: 91.6 fL (ref 80.0–100.0)
MPV: 9.2 fL (ref 7.5–12.5)
Monocytes Absolute: 408 cells/uL (ref 200–950)
Monocytes Relative: 8 %
NEUTROS ABS: 3162 {cells}/uL (ref 1500–7800)
NEUTROS PCT: 62 %
Platelets: 240 10*3/uL (ref 140–400)
RBC: 5.01 MIL/uL (ref 3.80–5.10)
RDW: 13.6 % (ref 11.0–15.0)
WBC: 5.1 10*3/uL (ref 3.8–10.8)

## 2016-12-27 LAB — BASIC METABOLIC PANEL WITH GFR
BUN: 18 mg/dL (ref 7–25)
CALCIUM: 9.4 mg/dL (ref 8.6–10.4)
CO2: 26 mmol/L (ref 20–31)
Chloride: 103 mmol/L (ref 98–110)
Creat: 0.7 mg/dL (ref 0.50–1.05)
GLUCOSE: 177 mg/dL — AB (ref 65–99)
POTASSIUM: 4.4 mmol/L (ref 3.5–5.3)
Sodium: 140 mmol/L (ref 135–146)

## 2016-12-27 LAB — TSH: TSH: 5.37 mIU/L — ABNORMAL HIGH

## 2016-12-28 LAB — PROTIME-INR
INR: 1.1
Prothrombin Time: 11.2 s (ref 9.0–11.5)

## 2016-12-28 LAB — APTT: APTT: 27 s (ref 22–34)

## 2017-01-03 ENCOUNTER — Ambulatory Visit (HOSPITAL_COMMUNITY)
Admission: RE | Admit: 2017-01-03 | Discharge: 2017-01-03 | Disposition: A | Discharge status: Home / Self Care | Payer: 59 | Source: Ambulatory Visit | Attending: Cardiovascular Disease | Admitting: Cardiovascular Disease

## 2017-01-03 ENCOUNTER — Ambulatory Visit: Payer: 59 | Admitting: Registered"

## 2017-01-03 ENCOUNTER — Encounter (HOSPITAL_COMMUNITY)
Admission: RE | Disposition: A | Discharge status: Home / Self Care | Payer: Self-pay | Source: Ambulatory Visit | Attending: Cardiovascular Disease

## 2017-01-03 DIAGNOSIS — E663 Overweight: Secondary | ICD-10-CM | POA: Insufficient documentation

## 2017-01-03 DIAGNOSIS — R0789 Other chest pain: Secondary | ICD-10-CM | POA: Diagnosis not present

## 2017-01-03 DIAGNOSIS — Z86718 Personal history of other venous thrombosis and embolism: Secondary | ICD-10-CM | POA: Diagnosis not present

## 2017-01-03 DIAGNOSIS — Z86711 Personal history of pulmonary embolism: Secondary | ICD-10-CM | POA: Insufficient documentation

## 2017-01-03 DIAGNOSIS — I519 Heart disease, unspecified: Secondary | ICD-10-CM | POA: Diagnosis not present

## 2017-01-03 DIAGNOSIS — Z8249 Family history of ischemic heart disease and other diseases of the circulatory system: Secondary | ICD-10-CM | POA: Diagnosis not present

## 2017-01-03 DIAGNOSIS — I10 Essential (primary) hypertension: Secondary | ICD-10-CM | POA: Diagnosis not present

## 2017-01-03 DIAGNOSIS — D6851 Activated protein C resistance: Secondary | ICD-10-CM | POA: Diagnosis not present

## 2017-01-03 DIAGNOSIS — I428 Other cardiomyopathies: Secondary | ICD-10-CM | POA: Diagnosis not present

## 2017-01-03 DIAGNOSIS — Z6841 Body Mass Index (BMI) 40.0 and over, adult: Secondary | ICD-10-CM | POA: Diagnosis not present

## 2017-01-03 DIAGNOSIS — E119 Type 2 diabetes mellitus without complications: Secondary | ICD-10-CM | POA: Diagnosis not present

## 2017-01-03 DIAGNOSIS — R079 Chest pain, unspecified: Secondary | ICD-10-CM

## 2017-01-03 HISTORY — PX: LEFT HEART CATH AND CORONARY ANGIOGRAPHY: CATH118249

## 2017-01-03 LAB — GLUCOSE, CAPILLARY: GLUCOSE-CAPILLARY: 151 mg/dL — AB (ref 65–99)

## 2017-01-03 SURGERY — LEFT HEART CATH AND CORONARY ANGIOGRAPHY

## 2017-01-03 MED ORDER — SODIUM CHLORIDE 0.9 % IV SOLN: INTRAVENOUS | Status: AC

## 2017-01-03 MED ORDER — SODIUM CHLORIDE 0.9% FLUSH: 3.0000 mL | INTRAVENOUS | Status: DC | PRN

## 2017-01-03 MED ORDER — SODIUM CHLORIDE 0.9 % WEIGHT BASED INFUSION
3.0000 mL/kg/h | INTRAVENOUS | Status: DC
  Administered 2017-01-03: 3 mL/kg/h via INTRAVENOUS

## 2017-01-03 MED ORDER — IOPAMIDOL (ISOVUE-370) INJECTION 76%
INTRAVENOUS | Status: DC | PRN
  Administered 2017-01-03: 50 mL via INTRA_ARTERIAL

## 2017-01-03 MED ORDER — IOPAMIDOL (ISOVUE-370) INJECTION 76%
INTRAVENOUS | Status: AC
  Filled 2017-01-03: qty 100

## 2017-01-03 MED ORDER — SODIUM CHLORIDE 0.9 % IV SOLN: 250.0000 mL | INTRAVENOUS | Status: DC | PRN

## 2017-01-03 MED ORDER — FENTANYL CITRATE (PF) 100 MCG/2ML IJ SOLN
INTRAMUSCULAR | Status: DC | PRN
  Administered 2017-01-03: 25 ug via INTRAVENOUS

## 2017-01-03 MED ORDER — ASPIRIN 81 MG PO CHEW
81.0000 mg | CHEWABLE_TABLET | ORAL | Status: AC
  Administered 2017-01-03: 81 mg via ORAL

## 2017-01-03 MED ORDER — ASPIRIN 81 MG PO CHEW
CHEWABLE_TABLET | ORAL | Status: AC
  Administered 2017-01-03: 81 mg via ORAL
  Filled 2017-01-03: qty 1

## 2017-01-03 MED ORDER — SODIUM CHLORIDE 0.9 % WEIGHT BASED INFUSION: 1.0000 mL/kg/h | INTRAVENOUS | Status: DC

## 2017-01-03 MED ORDER — LIDOCAINE HCL (PF) 1 % IJ SOLN
INTRAMUSCULAR | Status: DC | PRN
  Administered 2017-01-03: 2 mL

## 2017-01-03 MED ORDER — MIDAZOLAM HCL 2 MG/2ML IJ SOLN
INTRAMUSCULAR | Status: DC | PRN
  Administered 2017-01-03: 1 mg via INTRAVENOUS

## 2017-01-03 MED ORDER — ASPIRIN 81 MG PO CHEW: 81.0000 mg | CHEWABLE_TABLET | Freq: Every day | ORAL | Status: DC

## 2017-01-03 MED ORDER — ONDANSETRON HCL 4 MG/2ML IJ SOLN: 4.0000 mg | Freq: Four times a day (QID) | INTRAMUSCULAR | Status: DC | PRN

## 2017-01-03 MED ORDER — MIDAZOLAM HCL 2 MG/2ML IJ SOLN
INTRAMUSCULAR | Status: AC
  Filled 2017-01-03: qty 2

## 2017-01-03 MED ORDER — SODIUM CHLORIDE 0.9% FLUSH: 3.0000 mL | Freq: Two times a day (BID) | INTRAVENOUS | Status: DC

## 2017-01-03 MED ORDER — LIDOCAINE HCL (PF) 1 % IJ SOLN
INTRAMUSCULAR | Status: AC
  Filled 2017-01-03: qty 30

## 2017-01-03 MED ORDER — HEPARIN SODIUM (PORCINE) 1000 UNIT/ML IJ SOLN
INTRAMUSCULAR | Status: AC
  Filled 2017-01-03: qty 1

## 2017-01-03 MED ORDER — ACETAMINOPHEN 325 MG PO TABS: 650.0000 mg | ORAL_TABLET | ORAL | Status: DC | PRN

## 2017-01-03 MED ORDER — FENTANYL CITRATE (PF) 100 MCG/2ML IJ SOLN
INTRAMUSCULAR | Status: AC
  Filled 2017-01-03: qty 2

## 2017-01-03 MED ORDER — HEPARIN SODIUM (PORCINE) 1000 UNIT/ML IJ SOLN
INTRAMUSCULAR | Status: DC | PRN
  Administered 2017-01-03: 7000 [IU] via INTRAVENOUS

## 2017-01-03 MED ORDER — HEPARIN (PORCINE) IN NACL 2-0.9 UNIT/ML-% IJ SOLN
INTRAMUSCULAR | Status: DC | PRN
  Administered 2017-01-03: 1000 mL

## 2017-01-03 MED ORDER — VERAPAMIL HCL 2.5 MG/ML IV SOLN
INTRAVENOUS | Status: DC | PRN
  Administered 2017-01-03: 10 mL via INTRA_ARTERIAL

## 2017-01-03 MED ORDER — VERAPAMIL HCL 2.5 MG/ML IV SOLN
INTRAVENOUS | Status: AC
  Filled 2017-01-03: qty 2

## 2017-01-03 SURGICAL SUPPLY — 12 items
CATH EXPO 5FR ANG PIGTAIL 145 (CATHETERS) ×2 IMPLANT
CATH OPTITORQUE TIG 4.0 5F (CATHETERS) ×2 IMPLANT
DEVICE RAD COMP TR BAND LRG (VASCULAR PRODUCTS) ×2 IMPLANT
GLIDESHEATH SLEND A-KIT 6F 22G (SHEATH) ×2 IMPLANT
GUIDEWIRE INQWIRE 1.5J.035X260 (WIRE) ×2 IMPLANT
INQWIRE 1.5J .035X260CM (WIRE) ×4
KIT HEART LEFT (KITS) ×4 IMPLANT
PACK CARDIAC CATHETERIZATION (CUSTOM PROCEDURE TRAY) ×2 IMPLANT
SYR MEDRAD MARK V 150ML (SYRINGE) ×4 IMPLANT
TRANSDUCER W/STOPCOCK (MISCELLANEOUS) ×2 IMPLANT
TUBING CIL FLEX 10 FLL-RA (TUBING) ×2 IMPLANT
WIRE HI TORQ VERSACORE-J 145CM (WIRE) ×2 IMPLANT

## 2017-01-03 NOTE — Discharge Instructions (Signed)
Radial Site Care °Refer to this sheet in the next few weeks. These instructions provide you with information about caring for yourself after your procedure. Your health care provider may also give you more specific instructions. Your treatment has been planned according to current medical practices, but problems sometimes occur. Call your health care provider if you have any problems or questions after your procedure. °What can I expect after the procedure? °After your procedure, it is typical to have the following: °· Bruising at the radial site that usually fades within 1-2 weeks. °· Blood collecting in the tissue (hematoma) that may be painful to the touch. It should usually decrease in size and tenderness within 1-2 weeks. °Follow these instructions at home: °· Take medicines only as directed by your health care provider. °· You may shower 24-48 hours after the procedure or as directed by your health care provider. Remove the bandage (dressing) and gently wash the site with plain soap and water. Pat the area dry with a clean towel. Do not rub the site, because this may cause bleeding. °· Do not take baths, swim, or use a hot tub until your health care provider approves. °· Check your insertion site every day for redness, swelling, or drainage. °· Do not apply powder or lotion to the site. °· Do not flex or bend the affected arm for 24 hours or as directed by your health care provider. °· Do not push or pull heavy objects with the affected arm for 24 hours or as directed by your health care provider. °· Do not lift over 10 lb (4.5 kg) for 5 days after your procedure or as directed by your health care provider. °· Ask your health care provider when it is okay to: °¨ Return to work or school. °¨ Resume usual physical activities or sports. °¨ Resume sexual activity. °· Do not drive home if you are discharged the same day as the procedure. Have someone else drive you. °· You may drive 24 hours after the procedure  unless otherwise instructed by your health care provider. °· Do not operate machinery or power tools for 24 hours after the procedure. °· If your procedure was done as an outpatient procedure, which means that you went home the same day as your procedure, a responsible adult should be with you for the first 24 hours after you arrive home. °· Keep all follow-up visits as directed by your health care provider. This is important. °Contact a health care provider if: °· You have a fever. °· You have chills. °· You have increased bleeding from the radial site. Hold pressure on the site. °Get help right away if: °· You have unusual pain at the radial site. °· You have redness, warmth, or swelling at the radial site. °· You have drainage (other than a small amount of blood on the dressing) from the radial site. °· The radial site is bleeding, and the bleeding does not stop after 30 minutes of holding steady pressure on the site. °· Your arm or hand becomes pale, cool, tingly, or numb. °This information is not intended to replace advice given to you by your health care provider. Make sure you discuss any questions you have with your health care provider. °Document Released: 11/03/2010 Document Revised: 03/08/2016 Document Reviewed: 04/19/2014 °Elsevier Interactive Patient Education © 2017 [employer] ° °

## 2017-01-04 ENCOUNTER — Encounter (HOSPITAL_COMMUNITY): Payer: Self-pay | Admitting: Cardiovascular Disease

## 2017-01-31 ENCOUNTER — Ambulatory Visit (INDEPENDENT_AMBULATORY_CARE_PROVIDER_SITE_OTHER): Payer: 59 | Admitting: Physician Assistant

## 2017-01-31 ENCOUNTER — Encounter: Payer: Self-pay | Admitting: Physician Assistant

## 2017-01-31 VITALS — BP 132/77 | HR 73 | Ht 69.0 in | Wt 324.4 lb

## 2017-01-31 DIAGNOSIS — I10 Essential (primary) hypertension: Secondary | ICD-10-CM | POA: Diagnosis not present

## 2017-01-31 DIAGNOSIS — I428 Other cardiomyopathies: Secondary | ICD-10-CM

## 2017-01-31 DIAGNOSIS — R002 Palpitations: Secondary | ICD-10-CM

## 2017-01-31 DIAGNOSIS — I5022 Chronic systolic (congestive) heart failure: Secondary | ICD-10-CM | POA: Diagnosis not present

## 2017-01-31 MED ORDER — METOPROLOL SUCCINATE ER 25 MG PO TB24: 25.0000 mg | ORAL_TABLET | Freq: Every day | ORAL | 1 refills | Status: DC

## 2017-01-31 NOTE — Patient Instructions (Addendum)
Medication Instructions:  1.  START Toprol XL 25 mg taking 1 tablet at bedtime   Labwork: None ordered  Testing/Procedures: None ordered  Follow-Up: Your physician recommends that you schedule a follow-up appointment in: [place]   Any Other Special Instructions Will Be Listed Below (If Applicable).  Do not take more than 2500 cc's of fluid in daily which = 2.5 LITERS A DAY  Do not EAT MORE THAN 2000 MGS OF SODIUM.  See instructions below  DASH Eating Plan DASH stands for "Dietary Approaches to Stop Hypertension." The DASH eating plan is a healthy eating plan that has been shown to reduce high blood pressure (hypertension). It may also reduce your risk for type 2 diabetes, heart disease, and stroke. The DASH eating plan may also help with weight loss. What are tips for following this plan? General guidelines   Avoid eating more than 2,000 mg (milligrams) of salt (sodium) a day. If you have hypertension, you may need to reduce your sodium intake to 1,500 mg a day.  Limit alcohol intake to no more than 1 drink a day for nonpregnant women and 2 drinks a day for men. One drink equals 12 oz of beer, 5 oz of wine, or 1 oz of hard liquor.  Work with your health care provider to maintain a healthy body weight or to lose weight. Ask what an ideal weight is for you.  Get at least 30 minutes of exercise that causes your heart to beat faster (aerobic exercise) most days of the week. Activities may include walking, swimming, or biking.  Work with your health care provider or diet and nutrition specialist (dietitian) to adjust your eating plan to your individual calorie needs. Reading food labels   Check food labels for the amount of sodium per serving. Choose foods with less than 5 percent of the Daily Value of sodium. Generally, foods with less than 300 mg of sodium per serving fit into this eating plan.  To find whole grains, look for the word  "whole" as the first word in the ingredient list. Shopping   Buy products labeled as "low-sodium" or "no salt added."  Buy fresh foods. Avoid canned foods and premade or frozen meals. Cooking   Avoid adding salt when cooking. Use salt-free seasonings or herbs instead of table salt or sea salt. Check with your health care provider or pharmacist before using salt substitutes.  Do not fry foods. Cook foods using healthy methods such as baking, boiling, grilling, and broiling instead.  Cook with heart-healthy oils, such as olive, canola, soybean, or sunflower oil. Meal planning    Eat a balanced diet that includes:  5 or more servings of fruits and vegetables each day. At each meal, try to fill half of your plate with fruits and vegetables.  Up to 6-8 servings of whole grains each day.  Less than 6 oz of lean meat, poultry, or fish each day. A 3-oz serving of meat is about the same size as a deck of cards. One egg equals 1 oz.  2 servings of low-fat dairy each day.  A serving of nuts, seeds, or beans 5 times each week.  Heart-healthy fats. Healthy fats called Omega-3 fatty acids are found in foods such as flaxseeds and coldwater fish, like sardines, salmon, and mackerel.  Limit how much you eat of the following:  Canned or prepackaged foods.  Food that is high in trans fat, such as fried foods.  Food  that is high in saturated fat, such as fatty meat.  Sweets, desserts, sugary drinks, and other foods with added sugar.  Full-fat dairy products.  Do not salt foods before eating.  Try to eat at least 2 vegetarian meals each week.  Eat more home-cooked food and less restaurant, buffet, and fast food.  When eating at a restaurant, ask that your food be prepared with less salt or no salt, if possible. What foods are recommended? The items listed may not be a complete list. Talk with your dietitian about what dietary choices are best for you. Grains  Whole-grain or whole-wheat  bread. Whole-grain or whole-wheat pasta. Brown rice. [place]. Bulgur. Whole-grain and low-sodium cereals. Pita bread. Low-fat, low-sodium crackers. Whole-wheat flour tortillas. Vegetables  Fresh or frozen vegetables (raw, steamed, roasted, or grilled). Low-sodium or reduced-sodium tomato and vegetable juice. Low-sodium or reduced-sodium tomato sauce and tomato paste. Low-sodium or reduced-sodium canned vegetables. Fruits  All fresh, dried, or frozen fruit. Canned fruit in natural juice (without added sugar). Meat and other protein foods  Skinless chicken or [place]. Ground chicken or [place]. Pork with fat trimmed off. Fish and seafood. Egg whites. Dried beans, peas, or lentils. Unsalted nuts, nut butters, and seeds. Unsalted canned beans. Lean cuts of beef with fat trimmed off. Low-sodium, lean deli meat. Dairy  Low-fat (1%) or fat-free (skim) milk. Fat-free, low-fat, or reduced-fat cheeses. Nonfat, low-sodium ricotta or cottage cheese. Low-fat or nonfat yogurt. Low-fat, low-sodium cheese. Fats and oils  Soft margarine without trans fats. Vegetable oil. Low-fat, reduced-fat, or light mayonnaise and salad dressings (reduced-sodium). Canola, safflower, olive, soybean, and sunflower oils. Avocado. Seasoning and other foods  Herbs. Spices. Seasoning mixes without salt. Unsalted popcorn and pretzels. Fat-free sweets. What foods are not recommended? The items listed may not be a complete list. Talk with your dietitian about what dietary choices are best for you. Grains  Baked goods made with fat, such as croissants, muffins, or some breads. Dry pasta or rice meal packs. Vegetables  Creamed or fried vegetables. Vegetables in a cheese sauce. Regular canned vegetables (not low-sodium or reduced-sodium). Regular canned tomato sauce and paste (not low-sodium or reduced-sodium). Regular tomato and vegetable juice (not low-sodium or reduced-sodium). Angie Fava. Olives. Fruits  Canned fruit in a light or  heavy syrup. Fried fruit. Fruit in cream or butter sauce. Meat and other protein foods  Fatty cuts of meat. Ribs. Fried meat. Berniece Salines. Sausage. Bologna and other processed lunch meats. Salami. Fatback. Hotdogs. Bratwurst. Salted nuts and seeds. Canned beans with added salt. Canned or smoked fish. Whole eggs or egg yolks. Chicken or [place] with skin. Dairy  Whole or 2% milk, cream, and half-and-half. Whole or full-fat cream cheese. Whole-fat or sweetened yogurt. Full-fat cheese. Nondairy creamers. Whipped toppings. Processed cheese and cheese spreads. Fats and oils  Butter. Stick margarine. Lard. Shortening. Ghee. Bacon fat. Tropical oils, such as coconut, palm kernel, or palm oil. Seasoning and other foods  Salted popcorn and pretzels. Onion salt, garlic salt, seasoned salt, table salt, and sea salt. Worcestershire sauce. Tartar sauce. Barbecue sauce. Teriyaki sauce. Soy sauce, including reduced-sodium. Steak sauce. Canned and packaged gravies. Fish sauce. Oyster sauce. Cocktail sauce. Horseradish that you find on the shelf. Ketchup. Mustard. Meat flavorings and tenderizers. Bouillon cubes. Hot sauce and Tabasco sauce. Premade or packaged marinades. Premade or packaged taco seasonings. Relishes. Regular salad dressings. Where to find more information:  National Heart, Lung, and [place]: https://wilson-eaton.com/  American Heart Association: www.heart.org Summary  The DASH eating plan  is a healthy eating plan that has been shown to reduce high blood pressure (hypertension). It may also reduce your risk for type 2 diabetes, heart disease, and stroke.  With the DASH eating plan, you should limit salt (sodium) intake to 2,300 mg a day. If you have hypertension, you may need to reduce your sodium intake to 1,500 mg a day.  When on the DASH eating plan, aim to eat more fresh fruits and vegetables, whole grains, lean proteins, low-fat dairy, and heart-healthy fats.  Work with your health care provider  or diet and nutrition specialist (dietitian) to adjust your eating plan to your individual calorie needs. This information is not intended to replace advice given to you by your health care provider. Make sure you discuss any questions you have with your health care provider. Document Released: 09/20/2011 Document Revised: 09/24/2016 Document Reviewed: 09/24/2016 Elsevier Interactive Patient Education  2017 [employer].      If you need a refill on your cardiac medications before your next appointment, please call your pharmacy.

## 2017-01-31 NOTE — Progress Notes (Signed)
Cardiology Office Note   Date:  01/31/2017   ID:  Monica Solis, DOB April 13, 1961, MRN [id number]  PCP:  Shellia Carwin, PA-C  Cardiologist:  Dr. Gwenlyn Found, 12/18/2016  Monica Ferries, PA-C   Chief Complaint  Patient presents with  . Edema    always both feet and ankles the left more than the right     History of Present Illness: Monica Solis is a 56 y.o. female with a history of morbid obesity, heterozygous Factor 5 Leiden mutation, DVT, HTN, LBBB, DM, FH CAD, EF 35% echo and MV 11/2016>>cath 01/2017  12/18/2016 office visit with Dr. Gwenlyn Found, preop for elective Roux-en-Y bariatric surgery, then had cath to eval for  CAD.  Monica Solis presents for cardiology follow up  She gets chest pain 3-5 x month, but can have it several days in a row. Sometimes Tums or GasX helps, but the thing that does the most good is BenGay.   Her breathing is good. She denies any new dyspnea on exertion, orthopnea, or PND. Her legs swell every day. She does not add salt to foods, but eats salty food when she eats out. She is salt sensitive, wakes with edema when she eats salty foods at night. She has HCTZ 12.5 mg to take prn, but rarely does. She drinks 5 - 22 oz cups of water daily and has a bottle of water with her when she leaves the house. She does not eat out often but sometimes goes to lunch with friends.   She has had episodes of being light-headed and has woken with palpitations. She will take a breath and hold it, then the palpitations will go away. Her heart is beating very fast. It may happen once a month, or so.   Her major complaint is fatigue. She is doing most everything she was doing before, but is just exhausted afterwards. She wonders if that comes from her heart being weak.   Past Medical History:  Diagnosis Date  . Abnormal uterine bleeding (AUB)   . At risk for sleep apnea    STOP-BANG=  5       SENT TO PCP 10-01-2016  . BMI 45.0-49.9, adult ([place])   . Diverticulosis of colon   .  Endometrial polyp   . Factor 5 Leiden mutation, heterozygous ([place])    dx 2011  . History of DVT of lower extremity    2000  and 2011  . History of ectopic pregnancy   . Hypertension   . Left bundle branch block (LBBB)   . Peripheral neuropathy   . Type 2 diabetes mellitus treated with insulin [facility])     Past Surgical History:  Procedure Laterality Date  . COLONOSCOPY  2013  . DILATATION & CURETTAGE/HYSTEROSCOPY WITH MYOSURE N/A 10/05/2016   Procedure: [place];  Surgeon: Anastasio Auerbach, MD;  Location: [place];  Service: Gynecology;  Laterality: N/A;  requests to follow 7:30am case  requests one hour OR time  . LAPAROSCOPY BILATERAL TUBAL LIGATION WITH FALOPE RINGS/  D&C HYSTEROSCOPY WITH POLYPECTOMY  12/06/2007   and Lysis Adhesions  . LEFT HEART CATH AND CORONARY ANGIOGRAPHY N/A 01/03/2017   Procedure: Left Heart Cath and Coronary Angiography;  Surgeon: Lorretta Harp, MD;  Location: [place] [facility];  Service: Cardiovascular;  Laterality: N/A;    Medication Sig  . acetaminophen (TYLENOL) 500 MG tablet Take 500 mg by mouth daily as needed for headache.  [name] aspirin EC 325 MG  tablet Take 325 mg by mouth every evening.   . calcium carbonate (TUMS - DOSED IN MG ELEMENTAL CALCIUM) 500 MG chewable tablet Chew 1 tablet by mouth daily as needed for indigestion or heartburn.  . Cholecalciferol (VITAMIN D3) 2000 UNITS capsule Take 4,000 Units by mouth daily with supper.   . fexofenadine (ALLEGRA) 180 MG tablet Take 180 mg by mouth daily as needed for allergies or rhinitis.  [name] gabapentin (NEURONTIN) 100 MG capsule Take 200 mg by mouth at bedtime.   [name] ibuprofen (ADVIL,MOTRIN) 200 MG tablet Take 400 mg by mouth daily as needed (body pain).  . insulin aspart (NOVOLOG) 100 UNIT/ML injection Inject 7-10 Units into the skin 3 (three) times daily as needed for high blood sugar. Per SSC 7-10units when cbg above 120  . insulin detemir  (LEVEMIR) 100 UNIT/ML injection Inject 36 Units into the skin at bedtime.   . irbesartan (AVAPRO) 300 MG tablet Take 300 mg by mouth every morning.   . Iron-Vitamins (GERITOL COMPLETE) TABS Take 1 tablet by mouth daily with supper.   . levothyroxine (SYNTHROID, LEVOTHROID) 50 MCG tablet Take 50 mcg by mouth daily before breakfast. Take 13mcg daily except Sundays take 120mcg  . omeprazole (PRILOSEC) 40 MG capsule Take 40 mg by mouth at bedtime.   . Probiotic Product (PROBIOTIC DAILY) CAPS Take 1 capsule by mouth daily with supper.    No current facility-administered medications for this visit.    Facility-Administered Medications Ordered in Other Visits  Medication Dose Route Frequency Provider Last Rate Last Dose  . technetium tetrofosmin (TC-MYOVIEW) injection 55.7 millicurie  32.2 millicurie Intravenous Once PRN Pixie Casino, MD        Allergies:   Other; Benadryl [diphenhydramine hcl]; Invokana [canagliflozin]; Metformin; Pravastatin; Tramadol; and Victoza [liraglutide]    Social History:  The patient  reports that she quit smoking about 37 years ago. She smoked 0.00 packs per day for 2.00 years. She has never used smokeless tobacco. She reports that she does not drink alcohol or use drugs.   Family History:  The patient's family history includes Alzheimer's disease in her mother; Breast cancer in her maternal aunt; Cancer in her maternal grandfather; Diabetes in her maternal grandmother; Heart disease in her father, maternal grandmother, mother, paternal grandfather, and paternal grandmother; Hypertension in her mother; Mitral valve prolapse in her sister.    ROS:  Please see the history of present illness. All other systems are reviewed and negative.    PHYSICAL EXAM: VS:  BP 132/77   Pulse 73   Ht 5\' 9"  (1.753 m)   Wt (!) 324 lb 6.4 oz (147.1 kg)   SpO2 97%   BMI 47.91 kg/m  , BMI Body mass index is 47.91 kg/m. GEN: Well nourished, well developed, female in no acute  distress  HEENT: normal for age  Neck: no JVD seen, no carotid bruit, no masses Cardiac: RRR; no murmur, no rubs, or gallops Respiratory:  clear to auscultation bilaterally, normal work of breathing GI: soft, nontender, nondistended, + BS MS: no deformity or atrophy; trace edema; distal pulses are 2+ in all 4 extremities   Skin: warm and dry, no rash Neuro:  Strength and sensation are intact Psych: euthymic mood, full affect   EKG:  EKG is not ordered today.  ECHO 12/03/2016 - Left ventricle: The cavity size was moderately dilated. There was   mild concentric hypertrophy. Systolic function was normal.   Diffuse hypokinesis. Doppler parameters are consistent with   abnormal left  ventricular relaxation (grade 1 diastolic   dysfunction). Doppler parameters are consistent with elevated   ventricular end-diastolic filling pressure. - Ventricular septum: Septal motion showed paradox. - Aortic valve: There was no regurgitation. - Ascending aorta: The ascending aorta was normal in size. - Mitral valve: There was mild regurgitation. - Left atrium: The atrium was moderately dilated. - Right ventricle: Systolic function was normal. - Tricuspid valve: There was trivial regurgitation. - Pulmonary arteries: Systolic pressure was within the normal range. - Inferior vena cava: The vessel was normal in size. Impressions - LVEF is severely decreased at 30-35% with diffuse hypokinesis and   paradoxical septal motion.   RVEF is normal.  CATH: 01/03/2017 IMPRESSION:Ms Laba Has normal coronary arteries and severely depressed LV function. She has a nonischemic cardiomyopathy. She'll need optimal medical management. The sheath was removed and a TR band was placed on the right wrist which is patent hemostasis. The patient left the lab in stable condition. She will be discharged home later today and will follow-up with me as an outpatient in 2-3 weeks.   Recent Labs: 10/05/2016: ALT 39 12/27/2016:  BUN 18; Creat 0.70; Hemoglobin 14.8; Platelets 240; Potassium 4.4; Sodium 140; TSH 5.37    Lipid Panel No results found for: CHOL, TRIG, HDL, CHOLHDL, VLDL, LDLCALC, LDLDIRECT   Wt Readings from Last 3 Encounters:  01/31/17 (!) 324 lb 6.4 oz (147.1 kg)  01/03/17 (!) 318 lb (144.2 kg)  12/18/16 (!) 319 lb 3.2 oz (144.8 kg)     Other studies Reviewed: Additional studies/ records that were reviewed today include: Office notes, hospital records and testing.  ASSESSMENT AND PLAN:  1.  NICM, chronic systolic CHF: Her weight is up in the past month, but she has significant dietary indiscretions. Her sodium intake is high and her fluid intake is elevated as well. Because of her chronic kidney disease, I would prefer not to add a diuretic. Therefore, I will not add a diuretic at this time.   She is encouraged to limit sodium to 2000 mg a day and limit fluid to 2.5 L per day. This will be a significant reduction in both. I will add a low-dose of metoprolol to her medication regimen at bedtime. She is already on an ARB. Recheck an echocardiogram in 3 months.  2. Palpitations: She thinks she gets these about once a month. By her description, her heart rate is above 120. It is regular. I will start a beta blocker. If she continues to have symptoms, we will ask her to keep a symptom diary. She does this and the symptoms are happening more often than every 30 days, she can have an event monitor, otherwise she may need a loop recorder.  3. Hypertension: Her blood pressure slightly above goal, I believe she will tolerate the addition of the beta blocker.   Current medicines are reviewed at length with the patient today.  The patient does not have concerns regarding medicines.  The following changes have been made:  Add metoprolol  Labs/ tests ordered today include:  No orders of the defined types were placed in this encounter.    Disposition:   FU with Dr. Gwenlyn Found  Signed, Lenoard Aden    01/31/2017 9:39 AM    [place] Phone: [phone]; Fax: [phone]  This note was written with the assistance of speech recognition software. Please excuse any transcriptional errors.

## 2017-02-18 DIAGNOSIS — K922 Gastrointestinal hemorrhage, unspecified: Secondary | ICD-10-CM | POA: Diagnosis not present

## 2017-02-18 DIAGNOSIS — R109 Unspecified abdominal pain: Secondary | ICD-10-CM | POA: Diagnosis not present

## 2017-02-18 DIAGNOSIS — K5730 Diverticulosis of large intestine without perforation or abscess without bleeding: Secondary | ICD-10-CM | POA: Diagnosis not present

## 2017-03-01 ENCOUNTER — Encounter: Payer: Self-pay | Admitting: Cardiovascular Disease

## 2017-03-01 ENCOUNTER — Ambulatory Visit (INDEPENDENT_AMBULATORY_CARE_PROVIDER_SITE_OTHER): Payer: 59 | Admitting: Cardiovascular Disease

## 2017-03-01 VITALS — BP 126/82 | HR 85

## 2017-03-01 DIAGNOSIS — I454 Nonspecific intraventricular block: Secondary | ICD-10-CM

## 2017-03-01 DIAGNOSIS — D6851 Activated protein C resistance: Secondary | ICD-10-CM

## 2017-03-01 DIAGNOSIS — I519 Heart disease, unspecified: Secondary | ICD-10-CM

## 2017-03-01 DIAGNOSIS — I428 Other cardiomyopathies: Secondary | ICD-10-CM

## 2017-03-01 MED ORDER — CARVEDILOL 6.25 MG PO TABS: 6.2500 mg | ORAL_TABLET | Freq: Two times a day (BID) | ORAL | 3 refills | Status: DC

## 2017-03-01 NOTE — Patient Instructions (Addendum)
Medication Instructions: Your physician recommends that you continue on your current medications as directed. Please refer to the Current Medication list given to you today.  STOP Metoprolol START Carvedilol 6.25 mg twice daily.     Testing/Procedures: Your physician has requested that you have an echocardiogram in 3 months. Echocardiography is a painless test that uses sound waves to create images of your heart. It provides your doctor with information about the size and shape of your heart and how well your heart's chambers and valves are working. This procedure takes approximately one hour. There are no restrictions for this procedure.   Follow-Up: Your physician recommends that you schedule a follow-up appointment with PharmD to discuss switching from Avapro to [facility] and titrate carvedilol.---scheduled by Erasmo Downer, PharmD for 03/14/17 at [place]. Delene Loll 49-51 started today--samples given.)  Your physician recommends that you schedule a follow-up appointment in: 3-4 months with Dr. Gwenlyn Found.  If you need a refill on your cardiac medications before your next appointment, please call your pharmacy.

## 2017-03-01 NOTE — Assessment & Plan Note (Signed)
History of remote PE and DVT with demonstrated factor V Leiden deficiency. She was on Coumadin briefly. She may be a candidate for now we'll want to coagulant. I will defer to her primary care physician to make this decision.

## 2017-03-01 NOTE — Assessment & Plan Note (Signed)
History of lethargy, dysfunction with an EF in the 30th 35% range. Because of abnormal stress test I performed coronary angiography on her in the right radial approach on 01/03/17 revealing normal coronary arteries. Therefore, she has a nonischemic cardiomyopathy. She is on Avapro and metoprolol. She does have symptoms of congestive heart failure, class II or 3. I'm going to transition her to carvedilol and arrange for her to see Cyril Mourning, her pharmacist, to initiate Entresto therapy. We will recheck a 2-D echocardiogram in 3 months. In addition, she also has left bundle branch block and may be a candidate for CRT should medical therapy not improve her EF and symptoms.

## 2017-03-01 NOTE — Progress Notes (Signed)
03/01/2017 Monica Solis   09-28-1961  [id number]  Primary Physician Shellia Carwin, PA-C Primary Cardiologist: Lorretta Harp MD Renae Gloss  HPI:  Monica Solis is a 56 year old severely overweight married Caucasian female with no children who was referred by her general surgeon, Dr. Kieth Brightly for preoperative coronary vessel clearance prior to elective Roux-en-Y bariatric surgery. I last saw her in the office 12/18/16. Her risk factors for heart disease include treated diabetes, and hypertension. She does have a strong family history of heart disease with a father who died at age 32 of a myocardial infarction and brother at age 66. She has never had a heart attack or stroke. She denies chest pain or shortness of breath. She does have factor V Leiden deficiency has had DVT and pulmonary emboli in the past (2011). I performed a pharmacologic Myoview stress test that showed no evidence of ischemia but gated SPECT showed an ejection fraction of 35%. This was confirmed by 2-D echocardiography. She does now admit to episodes of chest burning which she has thought was reflux in the past. She underwent outpatient coronary angiography. The right radial approach by myself 01/03/17 revealing normal coronary arteries with an EF in the 30-35% range. She does have symptoms of congestive heart failure as well, class II to class III, and left bundle-branch block.  Current Outpatient Prescriptions  Medication Sig Dispense Refill  . acetaminophen (TYLENOL) 500 MG tablet Take 500 mg by mouth daily as needed for headache.    . calcium carbonate (TUMS - DOSED IN MG ELEMENTAL CALCIUM) 500 MG chewable tablet Chew 1 tablet by mouth daily as needed for indigestion or heartburn.    . Cholecalciferol (VITAMIN D3) 2000 UNITS capsule Take 4,000 Units by mouth daily with supper.     . fexofenadine (ALLEGRA) 180 MG tablet Take 180 mg by mouth daily as needed for allergies or rhinitis.    [name] gabapentin (NEURONTIN) 100  MG capsule Take 200 mg by mouth at bedtime.     [name] ibuprofen (ADVIL,MOTRIN) 200 MG tablet Take 400 mg by mouth daily as needed (body pain).    . insulin aspart (NOVOLOG) 100 UNIT/ML injection Inject 7-10 Units into the skin 3 (three) times daily as needed for high blood sugar. Per SSC 7-10units when cbg above 120    . insulin detemir (LEVEMIR) 100 UNIT/ML injection Inject 36 Units into the skin at bedtime.     . irbesartan (AVAPRO) 300 MG tablet Take 300 mg by mouth every morning.     . Iron-Vitamins (GERITOL COMPLETE) TABS Take 1 tablet by mouth daily with supper.     . levothyroxine (SYNTHROID, LEVOTHROID) 50 MCG tablet Take 50 mcg by mouth daily before breakfast. Take 80mcg daily except Sundays take 163mcg    . omeprazole (PRILOSEC) 40 MG capsule Take 40 mg by mouth at bedtime.     . Probiotic Product (PROBIOTIC DAILY) CAPS Take 1 capsule by mouth daily with supper.     . carvedilol (COREG) 6.25 MG tablet Take 1 tablet (6.25 mg total) by mouth 2 (two) times daily with a meal. 60 tablet 3   No current facility-administered medications for this visit.    Facility-Administered Medications Ordered in Other Visits  Medication Dose Route Frequency Provider Last Rate Last Dose  . technetium tetrofosmin (TC-MYOVIEW) injection 49.4 millicurie  49.6 millicurie Intravenous Once PRN Hilty, Nadean Corwin, MD        Allergies  Allergen Reactions  . Other Swelling  Metal  . Benadryl [Diphenhydramine Hcl] Hives  . Invokana [Canagliflozin] Other (See Comments)    Loss of bladder control   . Metformin Other (See Comments)    Kidney damage  . Pravastatin Other (See Comments)    Muscle ache  . Tramadol Other (See Comments)    "nightmares"  . Victoza [Liraglutide] Other (See Comments)    "Fainting"    Social History   Social History  . Marital status: Married    Spouse name: N/A  . Number of children: N/A  . Years of education: N/A   Occupational History  . Not on file.   Social History  Main Topics  . Smoking status: Former Smoker    Packs/day: 0.00    Years: 2.00    Quit date: 10/02/1979  . Smokeless tobacco: Never Used  . Alcohol use No  . Drug use: No  . Sexual activity: Not on file     Comment: 1st intercourse 32 yo-1 partner-BTL   Other Topics Concern  . Not on file   Social History Narrative  . No narrative on file     Review of Systems: General: negative for chills, fever, night sweats or weight changes.  Cardiovascular: negative for chest pain, dyspnea on exertion, edema, orthopnea, palpitations, paroxysmal nocturnal dyspnea or shortness of breath Dermatological: negative for rash Respiratory: negative for cough or wheezing Urologic: negative for hematuria Abdominal: negative for nausea, vomiting, diarrhea, bright red blood per rectum, melena, or hematemesis Neurologic: negative for visual changes, syncope, or dizziness All other systems reviewed and are otherwise negative except as noted above.    Blood pressure 126/82, pulse 85, SpO2 97 %.  General appearance: alert and no distress Neck: no adenopathy, no carotid bruit, no JVD, supple, symmetrical, trachea midline and thyroid not enlarged, symmetric, no tenderness/mass/nodules Lungs: clear to auscultation bilaterally Heart: regular rate and rhythm, S1, S2 normal, no murmur, click, rub or gallop Extremities: extremities normal, atraumatic, no cyanosis or edema  EKG not performed today  ASSESSMENT AND PLAN:   BBB (bundle branch block) Chronic  Factor 5 Leiden mutation, heterozygous (HCC) History of remote PE and DVT with demonstrated factor V Leiden deficiency. She was on Coumadin briefly. She may be a candidate for now we'll want to coagulant. I will defer to her primary care physician to make this decision.  Hyperlipidemia History of hyperlipidemia not on statin therapy followed by her PCP  Left ventricular dysfunction History of lethargy, dysfunction with an EF in the 30th 35% range.  Because of abnormal stress test I performed coronary angiography on her in the right radial approach on 01/03/17 revealing normal coronary arteries. Therefore, she has a nonischemic cardiomyopathy. She is on Avapro and metoprolol. She does have symptoms of congestive heart failure, class II or 3. I'm going to transition her to carvedilol and arrange for her to see Cyril Mourning, her pharmacist, to initiate Entresto therapy. We will recheck a 2-D echocardiogram in 3 months. In addition, she also has left bundle branch block and may be a candidate for CRT should medical therapy not improve her EF and symptoms.      Lorretta Harp MD FACP,FACC,FAHA, [facility] 03/01/2017 9:52 AM

## 2017-03-01 NOTE — Assessment & Plan Note (Signed)
Chronic. 

## 2017-03-01 NOTE — Assessment & Plan Note (Signed)
History of hyperlipidemia not on statin therapy followed by her PCP 

## 2017-03-05 DIAGNOSIS — D6851 Activated protein C resistance: Secondary | ICD-10-CM | POA: Diagnosis not present

## 2017-03-05 DIAGNOSIS — K922 Gastrointestinal hemorrhage, unspecified: Secondary | ICD-10-CM | POA: Diagnosis not present

## 2017-03-14 ENCOUNTER — Ambulatory Visit (INDEPENDENT_AMBULATORY_CARE_PROVIDER_SITE_OTHER): Payer: 59 | Admitting: Pharmacist Clinician (PhC)/ Clinical Pharmacy Specialist

## 2017-03-14 DIAGNOSIS — I519 Heart disease, unspecified: Secondary | ICD-10-CM

## 2017-03-14 MED ORDER — SACUBITRIL-VALSARTAN 49-51 MG PO TABS: 1.0000 | ORAL_TABLET | Freq: Two times a day (BID) | ORAL | 3 refills | Status: DC

## 2017-03-14 MED ORDER — CARVEDILOL 12.5 MG PO TABS: 12.5000 mg | ORAL_TABLET | Freq: Two times a day (BID) | ORAL | 3 refills | Status: DC

## 2017-03-14 NOTE — Progress Notes (Signed)
03/14/2017 Monica Solis November 23, 1960 [id number]   HPI:  Monica Solis is a 56 y.o. female patient of Dr Gwenlyn Found, with a PMH below who presents today for heart failure medication titration.  She is currently on carvedilol 6.25 mg bid and Entresto 49/51 mg bid.  [name]' medical history is significant for DM2, on insulin (last A1c 6.7), obesity and class II to III CHF with an EF of 30-35%.  She also has Factor V Leiden with multiple past DVT/PE. She was originally on warfarin but switched to ASA 325 mg.  This caused her to have a GI hemorrhage earlier this year, so she is currently not anticoagulated.    She originally was referred to cardiology for clearance to have bariatric surgery, but because of the heart failure issues, has decided to not go thru with that.  Patient has a home BP cuff that she uses 1-2 times per day.  She states all the readings have been below 150, mostly in the 120's since starting the [facility] and carvedilol.  She also notes that she gets dizziness at times with sudden positional changes.   Blood Pressure Goal:  130/80   Current Medications:  Carvedilol 6.25 mg bid  Entresto 49/51 mg bid  Family Hx:  Father died from MI at 85, brother at 42  Social Hx:  No tobacco, quit long ago; 1-2 alcoholic drinks per year; avoids caffeine  Diet:  Cooks mostly at home, with only occasional eating out; adds no salt to her foods; has give up on white foods for the most part  Exercise:  Walks dog, 15-20 minutes 2-3 times per day  Home BP readings:  Checks at home daily, nothing > 024 systolic since starting Entresto, believes it was about 120 this am.  Wt Readings from Last 3 Encounters:  01/31/17 (!) 324 lb 6.4 oz (147.1 kg)  01/03/17 (!) 318 lb (144.2 kg)  12/18/16 (!) 319 lb 3.2 oz (144.8 kg)   BP Readings from Last 3 Encounters:  03/14/17 112/74  03/01/17 126/82  01/31/17 132/77   Pulse Readings from Last 3 Encounters:  03/14/17 68  03/01/17 85  01/31/17 73      Current Outpatient Prescriptions  Medication Sig Dispense Refill  . hydrochlorothiazide (HYDRODIURIL) 12.5 MG tablet Take 12.5 mg by mouth daily.    [name] acetaminophen (TYLENOL) 500 MG tablet Take 500 mg by mouth daily as needed for headache.    . calcium carbonate (TUMS - DOSED IN MG ELEMENTAL CALCIUM) 500 MG chewable tablet Chew 1 tablet by mouth daily as needed for indigestion or heartburn.    . carvedilol (COREG) 12.5 MG tablet Take 1 tablet (12.5 mg total) by mouth 2 (two) times daily. 180 tablet 3  . Cholecalciferol (VITAMIN D3) 2000 UNITS capsule Take 4,000 Units by mouth daily with supper.     . fexofenadine (ALLEGRA) 180 MG tablet Take 180 mg by mouth daily as needed for allergies or rhinitis.    [name] gabapentin (NEURONTIN) 100 MG capsule Take 200 mg by mouth at bedtime.     [name] ibuprofen (ADVIL,MOTRIN) 200 MG tablet Take 400 mg by mouth daily as needed (body pain).    . insulin aspart (NOVOLOG) 100 UNIT/ML injection Inject 7-10 Units into the skin 3 (three) times daily as needed for high blood sugar. Per SSC 7-10units when cbg above 120    . insulin detemir (LEVEMIR) 100 UNIT/ML injection Inject 36 Units into the skin at bedtime.     . Iron-Vitamins (  GERITOL COMPLETE) TABS Take 1 tablet by mouth daily with supper.     . levothyroxine (SYNTHROID, LEVOTHROID) 50 MCG tablet Take 50 mcg by mouth daily before breakfast. Take 9mcg daily except Sundays take 141mcg    . omeprazole (PRILOSEC) 40 MG capsule Take 40 mg by mouth at bedtime.     . Probiotic Product (PROBIOTIC DAILY) CAPS Take 1 capsule by mouth daily with supper.     . sacubitril-valsartan (ENTRESTO) 49-51 MG Take 1 tablet by mouth 2 (two) times daily. 180 tablet 3   No current facility-administered medications for this visit.    Facility-Administered Medications Ordered in Other Visits  Medication Dose Route Frequency Provider Last Rate Last Dose  . technetium tetrofosmin (TC-MYOVIEW) injection 13.0 millicurie  86.5 millicurie  Intravenous Once PRN Hilty, Nadean Corwin, MD        Allergies  Allergen Reactions  . Other Swelling    Metal  . Benadryl [Diphenhydramine Hcl] Hives  . Invokana [Canagliflozin] Other (See Comments)    Loss of bladder control   . Metformin Other (See Comments)    Kidney damage  . Pravastatin Other (See Comments)    Muscle ache  . Tramadol Other (See Comments)    "nightmares"  . Victoza [Liraglutide] Other (See Comments)    "Fainting"    Past Medical History:  Diagnosis Date  . Abnormal uterine bleeding (AUB)   . At risk for sleep apnea    STOP-BANG=  5       SENT TO PCP 10-01-2016  . BMI 45.0-49.9, adult ([place])   . Diverticulosis of colon   . Endometrial polyp   . Factor 5 Leiden mutation, heterozygous ([place])    dx 2011  . History of DVT of lower extremity    2000  and 2011  . History of ectopic pregnancy   . Hypertension   . Left bundle branch block (LBBB)   . Peripheral neuropathy   . Type 2 diabetes mellitus treated with insulin (HCC)     Blood pressure 112/74, pulse 68. Standing pressure 98/68  Left ventricular dysfunction Patient with LV dysfunction and EF of 30-35%, feeling good with Entresto 49/51 and carvedilol 6.25, both twice daily.  Because of her occasional positional dizziness and lower standing BP, will not increase the Entresto at this time.  Instead will increase the carvedilol to 12.5 mg twice daily. I have asked that she continue with home BP monitoring and let us know if the orthostatic problems worsen.  It could be from lower blood pressures, but could also be a side effect of the Entresto, so we will need to watch closely if this worsens.     Tommy Medal PharmD CPP [facility]

## 2017-03-14 NOTE — Patient Instructions (Signed)
  Your blood pressure today is 112/74  Check your blood pressure at home daily and keep record of the readings.  Take your BP meds as follows:  Increase carvedilol to 12.5 mg twice daily  Continue with Entresto 49/51 mg twice daily  Take hydrochlorothiazide 12.5 mg as needed for swelling  Bring all of your meds, your BP cuff and your record of home blood pressures to your next appointment.  Exercise as you're able, try to walk approximately 30 minutes per day.  Keep salt intake to a minimum, especially watch canned and prepared boxed foods.  Eat more fresh fruits and vegetables and fewer canned items.  Avoid eating in fast food restaurants.    HOW TO TAKE YOUR BLOOD PRESSURE: . Rest 5 minutes before taking your blood pressure. .  Don't smoke or drink caffeinated beverages for at least 30 minutes before. . Take your blood pressure before (not after) you eat. . Sit comfortably with your back supported and both feet on the floor (don't cross your legs). . Elevate your arm to heart level on a table or a desk. . Use the proper sized cuff. It should fit smoothly and snugly around your bare upper arm. There should be enough room to slip a fingertip under the cuff. The bottom edge of the cuff should be 1 inch above the crease of the elbow. . Ideally, take 3 measurements at one sitting and record the average.

## 2017-03-14 NOTE — Assessment & Plan Note (Signed)
Patient with LV dysfunction and EF of 30-35%, feeling good with Entresto 49/51 and carvedilol 6.25, both twice daily.  Because of her occasional positional dizziness and lower standing BP, will not increase the Entresto at this time.  Instead will increase the carvedilol to 12.5 mg twice daily. I have asked that she continue with home BP monitoring and let us know if the orthostatic problems worsen.  It could be from lower blood pressures, but could also be a side effect of the Entresto, so we will need to watch closely if this worsens.

## 2017-03-15 ENCOUNTER — Other Ambulatory Visit (HOSPITAL_COMMUNITY): Payer: 59

## 2017-03-25 DIAGNOSIS — R809 Proteinuria, unspecified: Secondary | ICD-10-CM | POA: Diagnosis not present

## 2017-03-25 DIAGNOSIS — E1129 Type 2 diabetes mellitus with other diabetic kidney complication: Secondary | ICD-10-CM | POA: Diagnosis not present

## 2017-03-25 DIAGNOSIS — E039 Hypothyroidism, unspecified: Secondary | ICD-10-CM | POA: Diagnosis not present

## 2017-03-25 DIAGNOSIS — Z794 Long term (current) use of insulin: Secondary | ICD-10-CM | POA: Diagnosis not present

## 2017-04-04 DIAGNOSIS — E559 Vitamin D deficiency, unspecified: Secondary | ICD-10-CM | POA: Diagnosis not present

## 2017-04-04 DIAGNOSIS — I428 Other cardiomyopathies: Secondary | ICD-10-CM | POA: Diagnosis not present

## 2017-04-04 DIAGNOSIS — E038 Other specified hypothyroidism: Secondary | ICD-10-CM | POA: Diagnosis not present

## 2017-04-04 DIAGNOSIS — Z1389 Encounter for screening for other disorder: Secondary | ICD-10-CM | POA: Diagnosis not present

## 2017-04-04 DIAGNOSIS — E1151 Type 2 diabetes mellitus with diabetic peripheral angiopathy without gangrene: Secondary | ICD-10-CM | POA: Diagnosis not present

## 2017-04-04 DIAGNOSIS — D6851 Activated protein C resistance: Secondary | ICD-10-CM | POA: Diagnosis not present

## 2017-04-04 IMAGING — CR DG CHEST 2V
2 series · 2 of 2 positions shown · non-contrast
Comparison: None.

CLINICAL DATA: Preop bariatric surgery

EXAM:
CHEST  2 VIEW

[w chest pa]
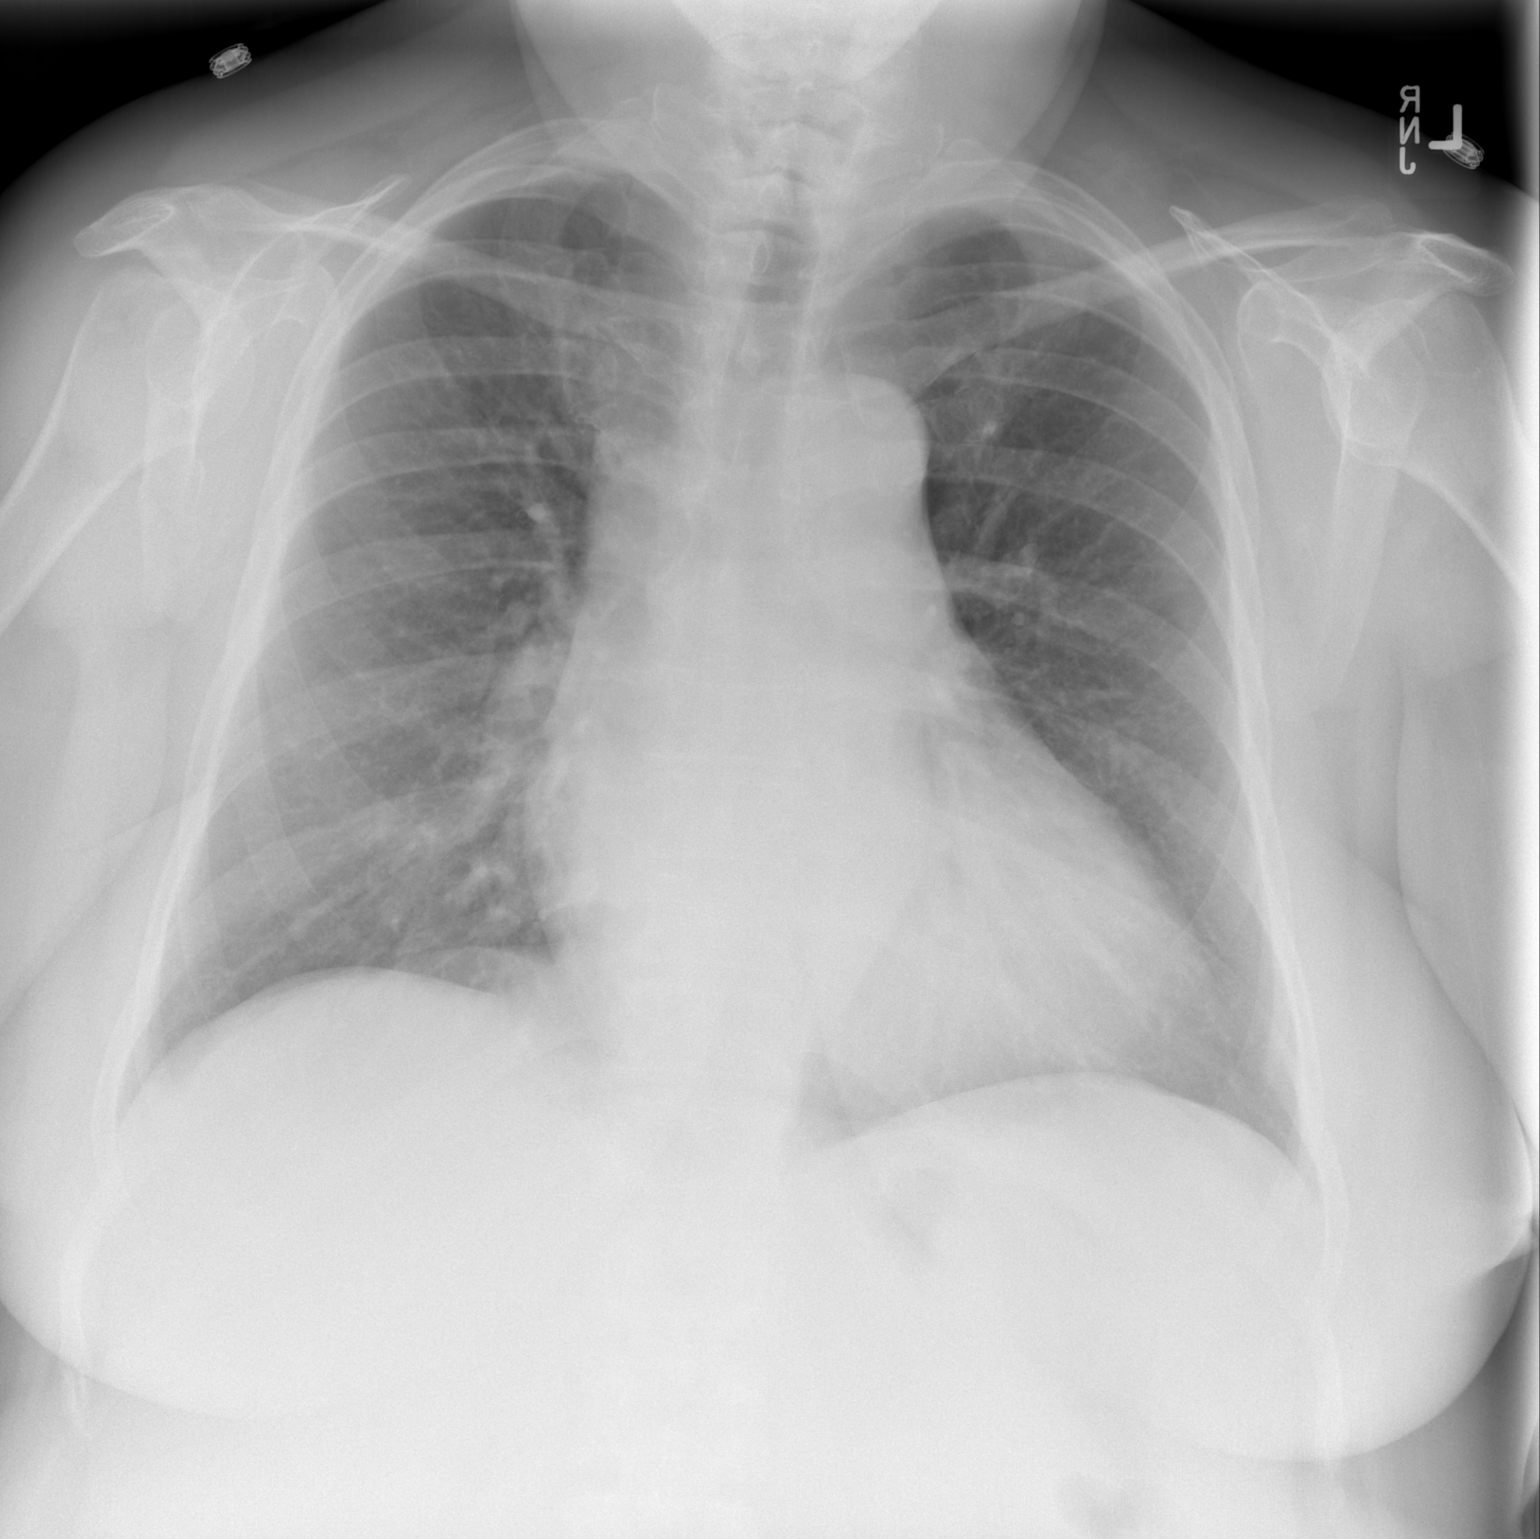

[w chest lat *]
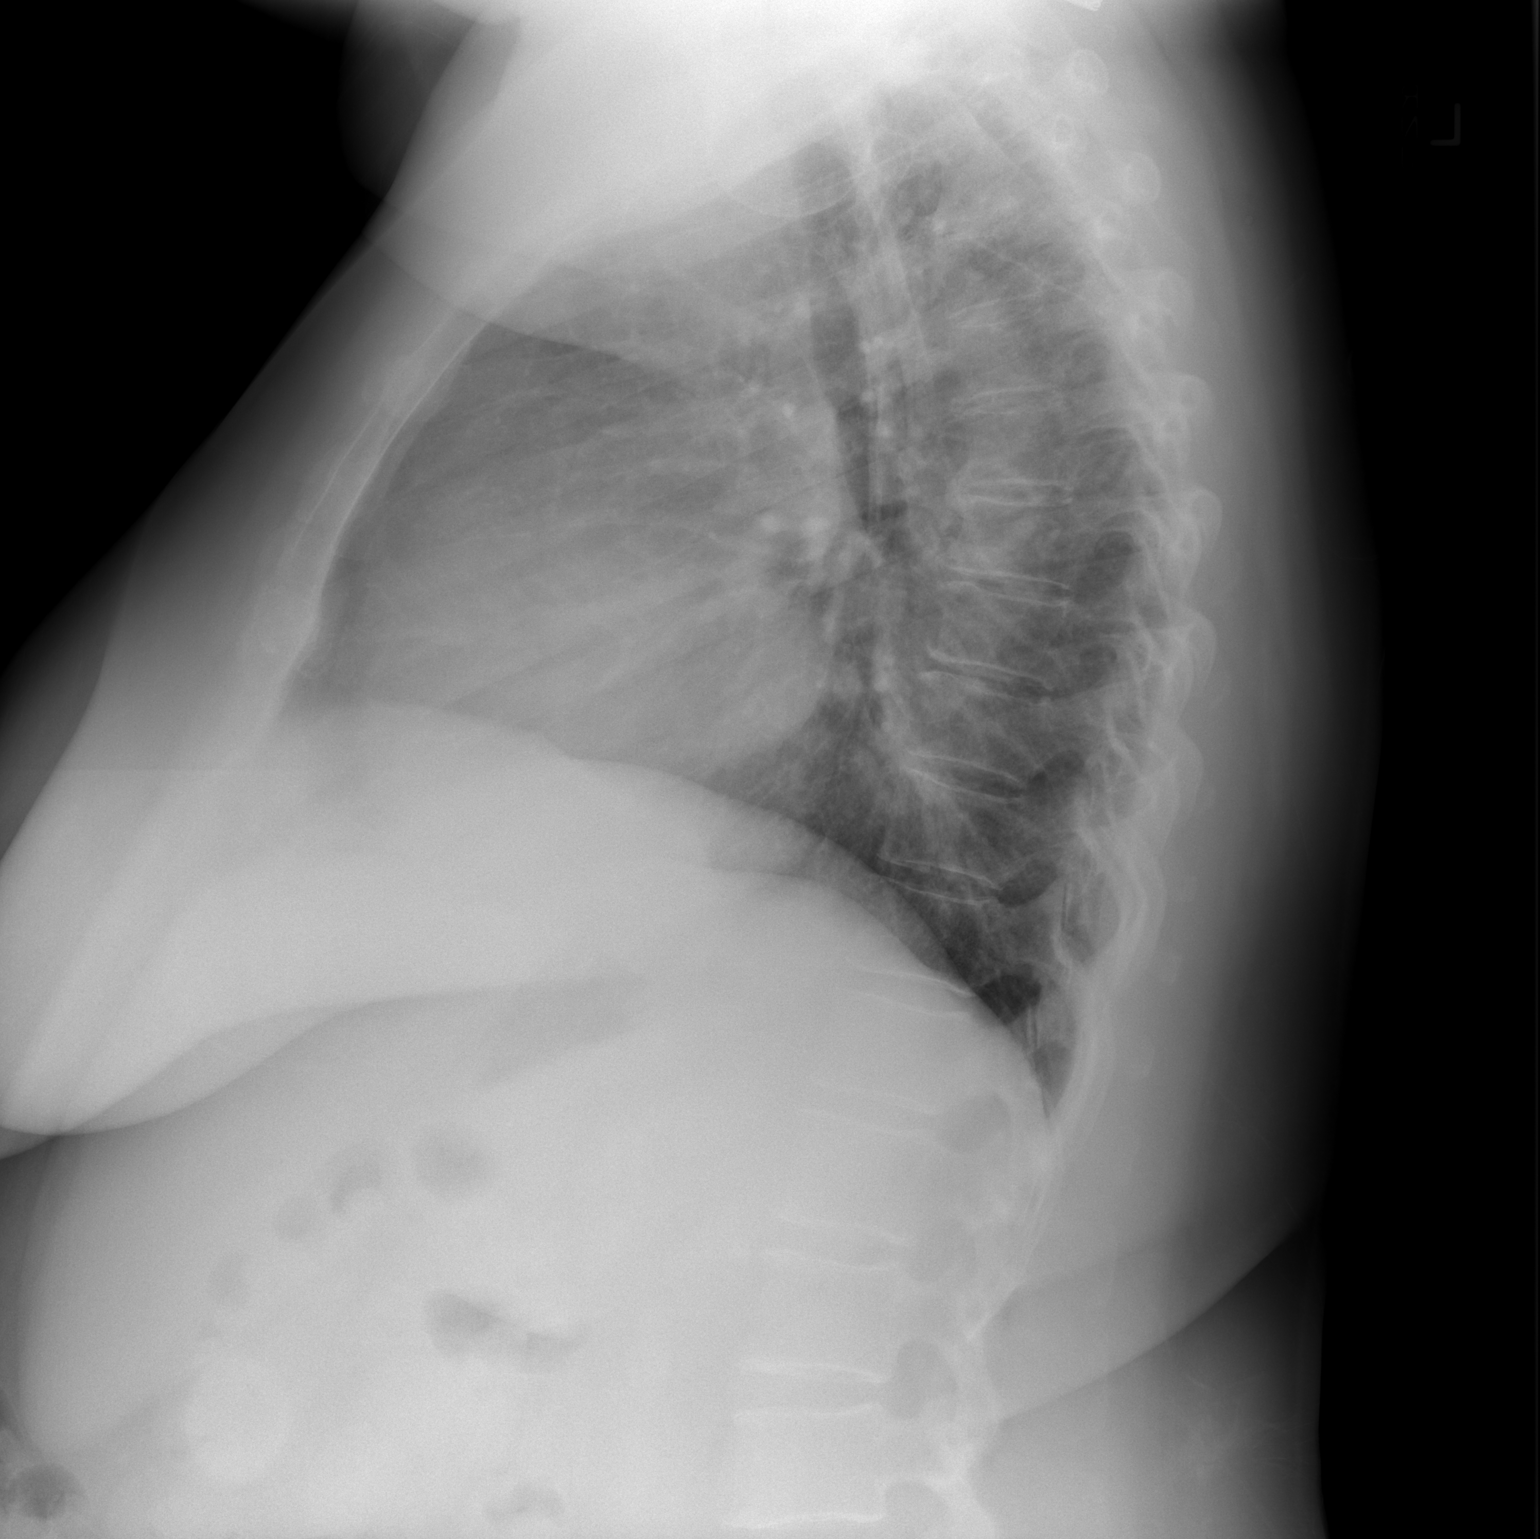

[2 of 2 positions shown; findings below may reference images not displayed]

FINDINGS: The heart size and mediastinal contours are within normal limits.
Both lungs are clear. The visualized skeletal structures are
unremarkable.
IMPRESSION: No active cardiopulmonary disease.

## 2017-05-15 IMAGING — NM NM MISC PROCEDURE
6 series · 36 of 36 positions shown · non-contrast
Comparison: none

[Series 1: wbr stress-gsp · 6.40mm/px · 6 of 470 frames shown]
[frame 40/470  full-range]
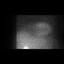
[frame 118/470  full-range]
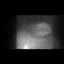
[frame 196/470  full-range]
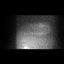
[frame 275/470  full-range]
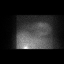
[frame 353/470  full-range]
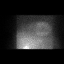
[frame 431/470  full-range]
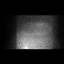

[Series 1: wbr_s-proj_st wbr stress-gsp · 6.40mm/px · 6 of 512 frames shown]
[frame 43/512]
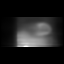
[frame 128/512]
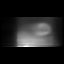
[frame 214/512]
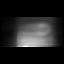
[frame 299/512]
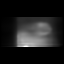
[frame 384/512]
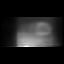
[frame 470/512]
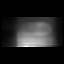

[Series 2: wbr_s-proj_st wbr stress-sum-em · 6.40mm/px · 6 of 64 frames shown]
[frame 6/64]
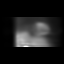
[frame 16/64]
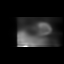
[frame 27/64]
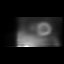
[frame 38/64]
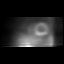
[frame 48/64]
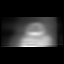
[frame 59/64]
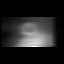

[Series 2: wbr stress-sum-em · 6.40mm/px · 6 of 63 frames shown]
[frame 6/63]
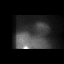
[frame 16/63]
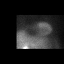
[frame 27/63]
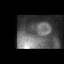
[frame 37/63]
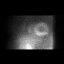
[frame 48/63]
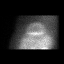
[frame 58/63]
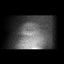

[Series 3: wbr rest · 6.40mm/px · 6 of 64 frames shown]
[frame 6/64]
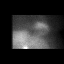
[frame 16/64]
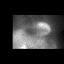
[frame 27/64]
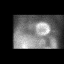
[frame 38/64]
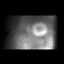
[frame 48/64]
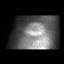
[frame 59/64]
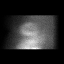

[Series 3: wbr_r-proj_st wbr rest · 6.40mm/px · 6 of 64 frames shown]
[frame 6/64]
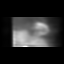
[frame 16/64]
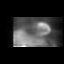
[frame 27/64]
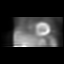
[frame 38/64]
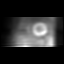
[frame 48/64]
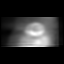
[frame 59/64]
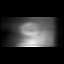

[36 of 36 positions shown; findings below may reference images not displayed]

Canned report from images found in remote index.

Refer to host system for actual result text.

## 2017-05-17 ENCOUNTER — Ambulatory Visit (HOSPITAL_COMMUNITY): Payer: 59 | Attending: Cardiology

## 2017-05-17 ENCOUNTER — Other Ambulatory Visit: Payer: Self-pay

## 2017-05-17 DIAGNOSIS — E669 Obesity, unspecified: Secondary | ICD-10-CM | POA: Diagnosis not present

## 2017-05-17 DIAGNOSIS — E785 Hyperlipidemia, unspecified: Secondary | ICD-10-CM | POA: Insufficient documentation

## 2017-05-17 DIAGNOSIS — Z87891 Personal history of nicotine dependence: Secondary | ICD-10-CM | POA: Diagnosis not present

## 2017-05-17 DIAGNOSIS — Z86718 Personal history of other venous thrombosis and embolism: Secondary | ICD-10-CM | POA: Diagnosis not present

## 2017-05-17 DIAGNOSIS — Z6841 Body Mass Index (BMI) 40.0 and over, adult: Secondary | ICD-10-CM | POA: Diagnosis not present

## 2017-05-17 DIAGNOSIS — I77810 Thoracic aortic ectasia: Secondary | ICD-10-CM | POA: Diagnosis not present

## 2017-05-17 DIAGNOSIS — E119 Type 2 diabetes mellitus without complications: Secondary | ICD-10-CM | POA: Diagnosis not present

## 2017-05-17 DIAGNOSIS — I10 Essential (primary) hypertension: Secondary | ICD-10-CM | POA: Insufficient documentation

## 2017-05-17 DIAGNOSIS — I428 Other cardiomyopathies: Secondary | ICD-10-CM | POA: Diagnosis not present

## 2017-05-17 DIAGNOSIS — I447 Left bundle-branch block, unspecified: Secondary | ICD-10-CM | POA: Diagnosis not present

## 2017-06-07 ENCOUNTER — Encounter: Payer: Self-pay | Admitting: Cardiovascular Disease

## 2017-06-07 ENCOUNTER — Ambulatory Visit (INDEPENDENT_AMBULATORY_CARE_PROVIDER_SITE_OTHER): Payer: 59 | Admitting: Cardiovascular Disease

## 2017-06-07 VITALS — BP 112/76 | HR 72 | Ht 69.0 in | Wt 326.4 lb

## 2017-06-07 DIAGNOSIS — I519 Heart disease, unspecified: Secondary | ICD-10-CM | POA: Diagnosis not present

## 2017-06-07 DIAGNOSIS — I10 Essential (primary) hypertension: Secondary | ICD-10-CM

## 2017-06-07 MED ORDER — SACUBITRIL-VALSARTAN 49-51 MG PO TABS: 1.0000 | ORAL_TABLET | Freq: Two times a day (BID) | ORAL | 3 refills | Status: DC

## 2017-06-07 NOTE — Progress Notes (Signed)
06/07/2017 Monica Solis   [birth year]  [id number]  Primary Physician Reynold Bowen, MD Primary Cardiologist: Lorretta Harp MD [name], [place]  HPI:  Monica Solis is a 56 y.o. female  severely overweight married Caucasian female with no children who was referred by her general surgeon, Dr. Kieth Brightly for preoperative coronary vessel clearance prior to elective Roux-en-Y bariatric surgery. I last saw her in the office 03/01/17. Her risk factors for heart disease include treated diabetes, and hypertension. She does have a strong family history of heart disease with a father who died at age 105 of a myocardial infarction and brother at age 54. She has never had a heart attack or stroke. She denies chest pain or shortness of breath. She does have factor V Leiden deficiency has had DVT and pulmonary emboli in the past (2011). I performed a pharmacologic Myoview stress test that showed no evidence of ischemia but gated SPECT showed an ejection fraction of 35%. This was confirmed by 2-D echocardiography. She does now admit to episodes of chest burning which she has thought was reflux in the past. She underwent outpatient coronary angiography. The right radial approach by myself 01/03/17 revealing normal coronary arteries with an EF in the 30-35% range. She does have symptoms of congestive heart failure as well, class II to class III, and left bundle-branch block. Since I saw her back in 3 months ago we have optimized her heart failure pharmacotherapy for her EF by 2-D echo performed 05/17/17 remained depressed at 30-35%.   Current Meds  Medication Sig  . acetaminophen (TYLENOL) 500 MG tablet Take 500 mg by mouth daily as needed for headache.  . calcium carbonate (TUMS - DOSED IN MG ELEMENTAL CALCIUM) 500 MG chewable tablet Chew 1 tablet by mouth daily as needed for indigestion or heartburn.  . carvedilol (COREG) 12.5 MG tablet Take 1 tablet (12.5 mg total) by mouth 2 (two) times daily.  .  Cholecalciferol (VITAMIN D3) 2000 UNITS capsule Take 4,000 Units by mouth daily with supper.   . fexofenadine (ALLEGRA) 180 MG tablet Take 180 mg by mouth daily as needed for allergies or rhinitis.  [name] gabapentin (NEURONTIN) 100 MG capsule Take 200 mg by mouth at bedtime.   . hydrochlorothiazide (HYDRODIURIL) 12.5 MG tablet Take 12.5 mg by mouth daily.  [name] ibuprofen (ADVIL,MOTRIN) 200 MG tablet Take 400 mg by mouth daily as needed (body pain).  . insulin aspart (NOVOLOG) 100 UNIT/ML injection Inject 7-10 Units into the skin 3 (three) times daily as needed for high blood sugar. Per SSC 7-10units when cbg above 120  . insulin detemir (LEVEMIR) 100 UNIT/ML injection Inject 36 Units into the skin at bedtime.   . Iron-Vitamins (GERITOL COMPLETE) TABS Take 1 tablet by mouth daily with supper.   . levothyroxine (SYNTHROID, LEVOTHROID) 50 MCG tablet Take 50 mcg by mouth daily before breakfast. Take 38mcg daily except Sundays take 171mcg  . omeprazole (PRILOSEC) 40 MG capsule Take 40 mg by mouth at bedtime.   . Probiotic Product (PROBIOTIC DAILY) CAPS Take 1 capsule by mouth daily with supper.   . sacubitril-valsartan (ENTRESTO) 49-51 MG Take 1 tablet by mouth 2 (two) times daily.  . [DISCONTINUED] sacubitril-valsartan (ENTRESTO) 49-51 MG Take 1 tablet by mouth 2 (two) times daily.     Allergies  Allergen Reactions  . Other Swelling    Metal  . Benadryl [Diphenhydramine Hcl] Hives  . Invokana [Canagliflozin] Other (See Comments)    Loss of bladder control   .  Metformin Other (See Comments)    Kidney damage  . Pravastatin Other (See Comments)    Muscle ache  . Tramadol Other (See Comments)    "nightmares"  . Victoza [Liraglutide] Other (See Comments)    "Fainting"    Social History   Social History  . Marital status: Married    Spouse name: N/A  . Number of children: N/A  . Years of education: N/A   Occupational History  . Not on file.   Social History Main Topics  . Smoking status:  Former Smoker    Packs/day: 0.00    Years: 2.00    Quit date: 10/02/1979  . Smokeless tobacco: Never Used  . Alcohol use No  . Drug use: No  . Sexual activity: Not on file     Comment: 1st intercourse 32 yo-1 partner-BTL   Other Topics Concern  . Not on file   Social History Narrative  . No narrative on file     Review of Systems: General: negative for chills, fever, night sweats or weight changes.  Cardiovascular: negative for chest pain, dyspnea on exertion, edema, orthopnea, palpitations, paroxysmal nocturnal dyspnea or shortness of breath Dermatological: negative for rash Respiratory: negative for cough or wheezing Urologic: negative for hematuria Abdominal: negative for nausea, vomiting, diarrhea, bright red blood per rectum, melena, or hematemesis Neurologic: negative for visual changes, syncope, or dizziness All other systems reviewed and are otherwise negative except as noted above.    Blood pressure 112/76, pulse 72, height 5\' 9"  (1.753 m), weight (!) 326 lb 6.4 oz (148.1 kg), SpO2 98 %.  General appearance: alert and no distress Neck: no adenopathy, no carotid bruit, no JVD, supple, symmetrical, trachea midline and thyroid not enlarged, symmetric, no tenderness/mass/nodules Lungs: clear to auscultation bilaterally Heart: regular rate and rhythm, S1, S2 normal, no murmur, click, rub or gallop Extremities: extremities normal, atraumatic, no cyanosis or edema  EKG not performed today  ASSESSMENT AND PLAN:   BBB (bundle branch block) Chronic  Essential hypertension History of essential hypertension with blood pressure measured this morning and 112/76. She is on carvedilol, hydrochlorothiazide and Entresto. Continue current meds at current dosing  Hyperlipidemia History of hyperlipidemia not on statin therapy  Left ventricular dysfunction History of nonischemic cardiomyopathy with cardiac catheterization performed by myself 01/03/17 revealing normal coronary  arteries with an EF in the 30-35% range. After optimizing her medical therapy her most recent EF by 2-D echo on 05/17/17 is unchanged. She still has moderate symptoms of systolic heart failure. I'm going to refer her to Dr. Crissie Sickles to discuss cardiac resynchronization therapy as well as ICD therapy.      Lorretta Harp MD FACP,FACC,FAHA, [facility] 06/07/2017 9:05 AM

## 2017-06-07 NOTE — Assessment & Plan Note (Signed)
History of essential hypertension with blood pressure measured this morning and 112/76. She is on carvedilol, hydrochlorothiazide and Entresto. Continue current meds at current dosing

## 2017-06-07 NOTE — Assessment & Plan Note (Signed)
History of nonischemic cardiomyopathy with cardiac catheterization performed by myself 01/03/17 revealing normal coronary arteries with an EF in the 30-35% range. After optimizing her medical therapy her most recent EF by 2-D echo on 05/17/17 is unchanged. She still has moderate symptoms of systolic heart failure. I'm going to refer her to Dr. Crissie Sickles to discuss cardiac resynchronization therapy as well as ICD therapy.

## 2017-06-07 NOTE — Assessment & Plan Note (Signed)
History of hyperlipidemia not on statin therapy. 

## 2017-06-07 NOTE — Assessment & Plan Note (Signed)
Chronic. 

## 2017-06-07 NOTE — Patient Instructions (Signed)
Medication Instructions: Your physician recommends that you continue on your current medications as directed. Please refer to the Current Medication list given to you today.   Follow-Up: You have been referred to Dr. Cristopher Peru for evaluation for ICD Implantation.  Your physician wants you to follow-up in: 6 months with Dr. Gwenlyn Found. You will receive a reminder letter in the mail two months in advance. If you don't receive a letter, please call our office to schedule the follow-up appointment.  If you need a refill on your cardiac medications before your next appointment, please call your pharmacy.

## 2017-06-10 ENCOUNTER — Telehealth: Payer: Self-pay | Admitting: Cardiovascular Disease

## 2017-06-10 NOTE — Telephone Encounter (Signed)
Spoke with pt, aware we have the handicap plague but will not be able to get it signed until dr berry returns in 2 weeks. Patient voiced understanding

## 2017-06-10 NOTE — Telephone Encounter (Signed)
New message   Pt states that she dropped off a form for Dr. Gwenlyn Found to fill out last week and is checking on the status of its completion.

## 2017-06-21 ENCOUNTER — Encounter: Payer: Self-pay | Admitting: Internal Medicine

## 2017-06-21 ENCOUNTER — Ambulatory Visit (INDEPENDENT_AMBULATORY_CARE_PROVIDER_SITE_OTHER): Payer: 59 | Admitting: Internal Medicine

## 2017-06-21 VITALS — BP 122/78 | HR 69 | Ht 69.0 in | Wt 328.8 lb

## 2017-06-21 DIAGNOSIS — I5022 Chronic systolic (congestive) heart failure: Secondary | ICD-10-CM

## 2017-06-21 DIAGNOSIS — I10 Essential (primary) hypertension: Secondary | ICD-10-CM

## 2017-06-21 DIAGNOSIS — I447 Left bundle-branch block, unspecified: Secondary | ICD-10-CM | POA: Diagnosis not present

## 2017-06-21 LAB — CBC
Hematocrit: 45.2 % (ref 34.0–46.6)
Hemoglobin: 15.3 g/dL (ref 11.1–15.9)
MCH: 30.1 pg (ref 26.6–33.0)
MCHC: 33.8 g/dL (ref 31.5–35.7)
MCV: 89 fL (ref 79–97)
PLATELETS: 261 10*3/uL (ref 150–379)
RBC: 5.08 x10E6/uL (ref 3.77–5.28)
RDW: 13.2 % (ref 12.3–15.4)
WBC: 7.3 10*3/uL (ref 3.4–10.8)

## 2017-06-21 LAB — BASIC METABOLIC PANEL
BUN/Creatinine Ratio: 23 (ref 9–23)
BUN: 16 mg/dL (ref 6–24)
CALCIUM: 9.8 mg/dL (ref 8.7–10.2)
CHLORIDE: 101 mmol/L (ref 96–106)
CO2: 25 mmol/L (ref 20–29)
Creatinine, Ser: 0.7 mg/dL (ref 0.57–1.00)
GFR calc Af Amer: 113 mL/min/{1.73_m2} (ref >59)
GFR calc non Af Amer: 98 mL/min/{1.73_m2} (ref >59)
GLUCOSE: 103 mg/dL — AB (ref 65–99)
Potassium: 4.7 mmol/L (ref 3.5–5.2)
Sodium: 140 mmol/L (ref 134–144)

## 2017-06-21 NOTE — Patient Instructions (Signed)
Medication Instructions:  Your physician recommends that you continue on your current medications as directed. Please refer to the Current Medication list given to you today.  Labwork:  You will get a CBC and BMET today.  Testing/Procedures:  Your physician has recommended that you have a defibrillator inserted. An implantable cardioverter defibrillator (ICD) is a small device that is placed in your chest or, in rare cases, your abdomen. This device uses electrical pulses or shocks to help control life-threatening, irregular heartbeats that could lead the heart to suddenly stop beating (sudden cardiac arrest). Leads are attached to the ICD that goes into your heart. This is done in the hospital and usually requires an overnight stay. Please see the instruction sheet given to you today for more information.  Your procedure is scheduled for 06/26/2017 @ 1:30 pm.  Your arrival time is 11:30 am.  Follow-Up: You will follow up in the device clinic in 10-14 days for a wound check.  You will follow up with Dr. Lovena Le 91 days after your procedure.  Any Other Special Instructions Will Be Listed Below (If Applicable).  Please arrive at the [facility] main entrance of [facility] hospital at:  11:30 am. Do not eat or drink after midnight prior to procedure Do not take any medications the morning of the procedure Take 1/2 your normal amount of Levemir night before you procedure. Plan for one night stay You will need someone to drive you home at discharge Use the surgical scrub as directed   If you need a refill on your cardiac medications before your next appointment, please call your pharmacy.

## 2017-06-21 NOTE — Progress Notes (Signed)
HPI Monica Solis is referred today by Dr. Gwenlyn Found for consideration of biventricular ICD/cardiac resynchronization therapy. The patient is a 56 year old morbidly obese woman who has a long history of left bundle branch block, chronic systolic heart failure, and no obstructive coronary disease by heart catheterization. Her ejection fraction is 30%, and she has left bundle branch block with a QRS duration of 160 ms. She has been on maximal medical therapy with beta blockers, and Enteresto. The patient has class II systolic heart failure symptoms. She has decided not to entertain bariatric surgery at this time. Allergies  Allergen Reactions  . Other Swelling    Metal  . Benadryl [Diphenhydramine Hcl] Hives  . Invokana [Canagliflozin] Other (See Comments)    Loss of bladder control   . Metformin Other (See Comments)    Kidney damage  . Pravastatin Other (See Comments)    Muscle ache  . Tramadol Other (See Comments)    "nightmares"  . Victoza [Liraglutide] Other (See Comments)    "Fainting"     Current Outpatient Prescriptions  Medication Sig Dispense Refill  . acetaminophen (TYLENOL) 500 MG tablet Take 500 mg by mouth daily as needed for headache.    . calcium carbonate (TUMS - DOSED IN MG ELEMENTAL CALCIUM) 500 MG chewable tablet Chew 1 tablet by mouth daily as needed for indigestion or heartburn.    . carvedilol (COREG) 12.5 MG tablet Take 1 tablet (12.5 mg total) by mouth 2 (two) times daily. 180 tablet 3  . Cholecalciferol (VITAMIN D3) 2000 UNITS capsule Take 4,000 Units by mouth daily with supper.     . fexofenadine (ALLEGRA) 180 MG tablet Take 180 mg by mouth daily as needed for allergies or rhinitis.    [name] gabapentin (NEURONTIN) 100 MG capsule Take 200 mg by mouth at bedtime.     . hydrochlorothiazide (HYDRODIURIL) 12.5 MG tablet Take 12.5 mg by mouth daily.    [name] ibuprofen (ADVIL,MOTRIN) 200 MG tablet Take 400 mg by mouth daily as needed (body pain).    . insulin aspart  (NOVOLOG) 100 UNIT/ML injection Inject 7-10 Units into the skin 3 (three) times daily as needed for high blood sugar. Per SSC 7-10units when cbg above 120    . insulin detemir (LEVEMIR) 100 UNIT/ML injection Inject 36 Units into the skin at bedtime.     . Iron-Vitamins (GERITOL COMPLETE) TABS Take 1 tablet by mouth daily with supper.     . levothyroxine (SYNTHROID, LEVOTHROID) 75 MCG tablet Take 75 mcg by mouth daily.    [name] omeprazole (PRILOSEC) 40 MG capsule Take 40 mg by mouth at bedtime.     . Probiotic Product (PROBIOTIC DAILY) CAPS Take 1 capsule by mouth daily with supper.     . sacubitril-valsartan (ENTRESTO) 49-51 MG Take 1 tablet by mouth 2 (two) times daily. 180 tablet 3   No current facility-administered medications for this visit.    Facility-Administered Medications Ordered in Other Visits  Medication Dose Route Frequency Provider Last Rate Last Dose  . technetium tetrofosmin (TC-MYOVIEW) injection 26.3 millicurie  78.5 millicurie Intravenous Once PRN Hilty, Nadean Corwin, MD         Past Medical History:  Diagnosis Date  . Abnormal uterine bleeding (AUB)   . At risk for sleep apnea    STOP-BANG=  5       SENT TO PCP 10-01-2016  . BMI 45.0-49.9, adult ([place])   . Diverticulosis of colon   . Endometrial polyp   . Factor  5 Leiden mutation, heterozygous ([place])    dx 2011  . History of DVT of lower extremity    2000  and 2011  . History of ectopic pregnancy   . Hypertension   . Left bundle branch block (LBBB)   . Peripheral neuropathy   . Type 2 diabetes mellitus treated with insulin (HCC)     ROS:   All systems reviewed and negative except as noted in the HPI.   Past Surgical History:  Procedure Laterality Date  . COLONOSCOPY  2013  . DILATATION & CURETTAGE/HYSTEROSCOPY WITH MYOSURE N/A 10/05/2016   Procedure: [place];  Surgeon: Anastasio Auerbach, MD;  Location: [place];  Service: Gynecology;  Laterality: N/A;   requests to follow 7:30am case  requests one hour OR time  . LAPAROSCOPY BILATERAL TUBAL LIGATION WITH FALOPE RINGS/  D&C HYSTEROSCOPY WITH POLYPECTOMY  12/06/2007   and Lysis Adhesions  . LEFT HEART CATH AND CORONARY ANGIOGRAPHY N/A 01/03/2017   Procedure: Left Heart Cath and Coronary Angiography;  Surgeon: Lorretta Harp, MD;  Location: [place] [facility];  Service: Cardiovascular;  Laterality: N/A;     Family History  Problem Relation Age of Onset  . Hypertension Mother   . Heart disease Mother   . Alzheimer's disease Mother   . Heart disease Father   . Breast cancer Maternal Aunt        30's  . Diabetes Maternal Grandmother   . Heart disease Maternal Grandmother   . Heart disease Paternal Grandmother   . Cancer Maternal Grandfather        Unknown type  . Mitral valve prolapse Sister   . Heart disease Paternal Grandfather      Social History   Social History  . Marital status: Married    Spouse name: N/A  . Number of children: N/A  . Years of education: N/A   Occupational History  . Not on file.   Social History Main Topics  . Smoking status: Former Smoker    Packs/day: 0.00    Years: 2.00    Quit date: 10/02/1979  . Smokeless tobacco: Never Used  . Alcohol use No  . Drug use: No  . Sexual activity: Not on file     Comment: 1st intercourse 32 yo-1 partner-BTL   Other Topics Concern  . Not on file   Social History Narrative  . No narrative on file     BP 122/78   Pulse 69   Ht 5\' 9"  (1.753 m)   Wt (!) 328 lb 12.8 oz (149.1 kg)   BMI 48.56 kg/m   Physical Exam:  Obese appearing 56 year old woman, NAD HEENT: Unremarkable Neck:  6 cm JVD, no thyromegally Lymphatics:  No adenopathy Back:  No CVA tenderness Lungs:  Clear, with no wheezes, rales, or rhonchi HEART:  Regular rate rhythm, no murmurs, no rubs, no clicks Abd:  soft, positive bowel sounds, no organomegally, no rebound, no guarding Ext:  2 plus pulses, no edema, no cyanosis, no  clubbing Skin:  No rashes no nodules Neuro:  CN II through XII intact, motor grossly intact  EKG - normal sinus rhythm with left bundle branch block  Assess/Plan: 1. Chronic systolic heart failure - she is on maximal medical therapy. I've discussed the indications, risks, benefits, goals, and expectations of biventricular ICD implantation with the patient. She wishes to proceed. 2. Obesity - with her LV dysfunction, the patient currently is not interested in pursuing bariatric surgery.  Hopefully her pumping function will improve and if so potentially reconsider bariatric surgery. 3. Hypertension - she will continue her current medications and encouraged to maintain a low-sodium diet. Hopefully she will be able to lose weight.  Cristopher Peru, M.D.

## 2017-06-26 ENCOUNTER — Ambulatory Visit (HOSPITAL_COMMUNITY)
Admission: RE | Disposition: A | Discharge status: Home / Self Care | Payer: Self-pay | Source: Ambulatory Visit | Attending: Internal Medicine

## 2017-06-26 ENCOUNTER — Encounter (HOSPITAL_COMMUNITY): Payer: Self-pay | Admitting: General Practice

## 2017-06-26 ENCOUNTER — Ambulatory Visit (HOSPITAL_COMMUNITY)
Admission: RE | Admit: 2017-06-26 | Discharge: 2017-06-27 | Disposition: A | Discharge status: Home / Self Care | Payer: 59 | Source: Ambulatory Visit | Attending: Internal Medicine | Admitting: Internal Medicine

## 2017-06-26 DIAGNOSIS — I429 Cardiomyopathy, unspecified: Secondary | ICD-10-CM | POA: Diagnosis not present

## 2017-06-26 DIAGNOSIS — Z791 Long term (current) use of non-steroidal anti-inflammatories (NSAID): Secondary | ICD-10-CM | POA: Diagnosis not present

## 2017-06-26 DIAGNOSIS — Z006 Encounter for examination for normal comparison and control in clinical research program: Secondary | ICD-10-CM | POA: Insufficient documentation

## 2017-06-26 DIAGNOSIS — I5022 Chronic systolic (congestive) heart failure: Secondary | ICD-10-CM

## 2017-06-26 DIAGNOSIS — Z794 Long term (current) use of insulin: Secondary | ICD-10-CM | POA: Diagnosis not present

## 2017-06-26 DIAGNOSIS — Z87891 Personal history of nicotine dependence: Secondary | ICD-10-CM | POA: Diagnosis not present

## 2017-06-26 DIAGNOSIS — E1142 Type 2 diabetes mellitus with diabetic polyneuropathy: Secondary | ICD-10-CM | POA: Diagnosis not present

## 2017-06-26 DIAGNOSIS — Z79899 Other long term (current) drug therapy: Secondary | ICD-10-CM | POA: Diagnosis not present

## 2017-06-26 DIAGNOSIS — Z95810 Presence of automatic (implantable) cardiac defibrillator: Secondary | ICD-10-CM

## 2017-06-26 DIAGNOSIS — I5042 Chronic combined systolic (congestive) and diastolic (congestive) heart failure: Secondary | ICD-10-CM | POA: Diagnosis present

## 2017-06-26 DIAGNOSIS — I447 Left bundle-branch block, unspecified: Secondary | ICD-10-CM | POA: Diagnosis not present

## 2017-06-26 DIAGNOSIS — I428 Other cardiomyopathies: Secondary | ICD-10-CM | POA: Diagnosis not present

## 2017-06-26 DIAGNOSIS — Z6841 Body Mass Index (BMI) 40.0 and over, adult: Secondary | ICD-10-CM | POA: Insufficient documentation

## 2017-06-26 DIAGNOSIS — I110 Hypertensive heart disease with heart failure: Secondary | ICD-10-CM | POA: Diagnosis not present

## 2017-06-26 DIAGNOSIS — Z86718 Personal history of other venous thrombosis and embolism: Secondary | ICD-10-CM | POA: Diagnosis not present

## 2017-06-26 DIAGNOSIS — D6851 Activated protein C resistance: Secondary | ICD-10-CM | POA: Diagnosis not present

## 2017-06-26 HISTORY — DX: Presence of automatic (implantable) cardiac defibrillator: Z95.810

## 2017-06-26 HISTORY — DX: Unspecified osteoarthritis, unspecified site: M19.90

## 2017-06-26 HISTORY — DX: Pure hypercholesterolemia, unspecified: E78.00

## 2017-06-26 HISTORY — PX: BI-VENTRICULAR IMPLANTABLE CARDIOVERTER DEFIBRILLATOR  (CRT-D): SHX5747

## 2017-06-26 HISTORY — DX: Nausea with vomiting, unspecified: R11.2

## 2017-06-26 HISTORY — DX: Unspecified chronic bronchitis: J42

## 2017-06-26 HISTORY — DX: Gastro-esophageal reflux disease without esophagitis: K21.9

## 2017-06-26 HISTORY — DX: Other specified postprocedural states: Z98.890

## 2017-06-26 HISTORY — DX: Hypothyroidism, unspecified: E03.9

## 2017-06-26 HISTORY — DX: Acute embolism and thrombosis of unspecified vein: I82.90

## 2017-06-26 HISTORY — PX: BIV ICD INSERTION CRT-D: EP1195

## 2017-06-26 LAB — GLUCOSE, CAPILLARY
GLUCOSE-CAPILLARY: 103 mg/dL — AB (ref 65–99)
Glucose-Capillary: 144 mg/dL — ABNORMAL HIGH (ref 65–99)
Glucose-Capillary: 132 mg/dL — ABNORMAL HIGH (ref 65–99)

## 2017-06-26 LAB — SURGICAL PCR SCREEN
MRSA, PCR: NEGATIVE
STAPHYLOCOCCUS AUREUS: NEGATIVE

## 2017-06-26 SURGERY — BIV ICD INSERTION CRT-D

## 2017-06-26 MED ORDER — SACUBITRIL-VALSARTAN 49-51 MG PO TABS
1.0000 | ORAL_TABLET | Freq: Two times a day (BID) | ORAL | Status: DC
  Administered 2017-06-26 – 2017-06-27 (×2): 1 via ORAL
  Filled 2017-06-26 (×2): qty 1

## 2017-06-26 MED ORDER — INSULIN ASPART 100 UNIT/ML ~~LOC~~ SOLN
0.0000 [IU] | Freq: Three times a day (TID) | SUBCUTANEOUS | Status: DC
  Administered 2017-06-27: 2 [IU] via SUBCUTANEOUS

## 2017-06-26 MED ORDER — GERITOL COMPLETE PO TABS: 1.0000 | ORAL_TABLET | Freq: Every day | ORAL | Status: DC

## 2017-06-26 MED ORDER — VITAMIN D 1000 UNITS PO TABS
4000.0000 [IU] | ORAL_TABLET | Freq: Every day | ORAL | Status: DC
Start: 2017-06-26 — End: 2017-06-27
  Administered 2017-06-26: 20:00:00 4000 [IU] via ORAL
  Filled 2017-06-26: qty 4

## 2017-06-26 MED ORDER — INSULIN ASPART 100 UNIT/ML ~~LOC~~ SOLN: 7.0000 [IU] | Freq: Three times a day (TID) | SUBCUTANEOUS | Status: DC | PRN

## 2017-06-26 MED ORDER — ADULT MULTIVITAMIN W/MINERALS CH: 1.0000 | ORAL_TABLET | Freq: Every day | ORAL | Status: DC

## 2017-06-26 MED ORDER — CARVEDILOL 12.5 MG PO TABS
12.5000 mg | ORAL_TABLET | Freq: Two times a day (BID) | ORAL | Status: DC
  Administered 2017-06-26 – 2017-06-27 (×2): 12.5 mg via ORAL
  Filled 2017-06-26 (×2): qty 1

## 2017-06-26 MED ORDER — CALCIUM CARBONATE ANTACID 500 MG PO CHEW
1.0000 | CHEWABLE_TABLET | Freq: Every day | ORAL | Status: DC | PRN
Start: 2017-06-26 — End: 2017-06-27
  Filled 2017-06-26: qty 1

## 2017-06-26 MED ORDER — IOPAMIDOL (ISOVUE-370) INJECTION 76%
INTRAVENOUS | Status: AC
  Filled 2017-06-26: qty 50

## 2017-06-26 MED ORDER — MUPIROCIN 2 % EX OINT
TOPICAL_OINTMENT | CUTANEOUS | Status: AC
  Administered 2017-06-26: 1
  Filled 2017-06-26: qty 22

## 2017-06-26 MED ORDER — LIDOCAINE HCL (PF) 1 % IJ SOLN
INTRAMUSCULAR | Status: AC
  Filled 2017-06-26: qty 30

## 2017-06-26 MED ORDER — MIDAZOLAM HCL 5 MG/5ML IJ SOLN
INTRAMUSCULAR | Status: DC | PRN
  Administered 2017-06-26 (×11): 1 mg via INTRAVENOUS

## 2017-06-26 MED ORDER — CEFAZOLIN SODIUM-DEXTROSE 2-4 GM/100ML-% IV SOLN
INTRAVENOUS | Status: AC
  Filled 2017-06-26: qty 100

## 2017-06-26 MED ORDER — MIDAZOLAM HCL 5 MG/5ML IJ SOLN
INTRAMUSCULAR | Status: AC
  Filled 2017-06-26: qty 5

## 2017-06-26 MED ORDER — FENTANYL CITRATE (PF) 100 MCG/2ML IJ SOLN
INTRAMUSCULAR | Status: DC | PRN
  Administered 2017-06-26 (×11): 12.5 ug via INTRAVENOUS

## 2017-06-26 MED ORDER — CHLORHEXIDINE GLUCONATE 4 % EX LIQD: 60.0000 mL | Freq: Once | CUTANEOUS | Status: DC

## 2017-06-26 MED ORDER — CARVEDILOL 12.5 MG PO TABS: 12.5000 mg | ORAL_TABLET | Freq: Two times a day (BID) | ORAL | Status: DC

## 2017-06-26 MED ORDER — HYDROCHLOROTHIAZIDE 25 MG PO TABS: 12.5000 mg | ORAL_TABLET | Freq: Every day | ORAL | Status: DC | PRN

## 2017-06-26 MED ORDER — IOPAMIDOL (ISOVUE-370) INJECTION 76%
INTRAVENOUS | Status: DC | PRN
  Administered 2017-06-26: 17 mL via INTRAVENOUS

## 2017-06-26 MED ORDER — RISAQUAD PO CAPS
1.0000 | ORAL_CAPSULE | Freq: Every day | ORAL | Status: DC
  Administered 2017-06-26: 20:00:00 1 via ORAL
  Filled 2017-06-26: qty 1

## 2017-06-26 MED ORDER — LEVOTHYROXINE SODIUM 75 MCG PO TABS
75.0000 ug | ORAL_TABLET | Freq: Every day | ORAL | Status: DC
  Administered 2017-06-27: 75 ug via ORAL
  Filled 2017-06-26: qty 1

## 2017-06-26 MED ORDER — CEFAZOLIN SODIUM-DEXTROSE 1-4 GM/50ML-% IV SOLN
1.0000 g | Freq: Four times a day (QID) | INTRAVENOUS | Status: AC
  Administered 2017-06-26 – 2017-06-27 (×3): 1 g via INTRAVENOUS
  Filled 2017-06-26 (×3): qty 50

## 2017-06-26 MED ORDER — HEPARIN (PORCINE) IN NACL 2-0.9 UNIT/ML-% IJ SOLN
INTRAMUSCULAR | Status: AC | PRN
  Administered 2017-06-26: 500 mL

## 2017-06-26 MED ORDER — PROBIOTIC DAILY PO CAPS: 1.0000 | ORAL_CAPSULE | Freq: Every day | ORAL | Status: DC

## 2017-06-26 MED ORDER — SODIUM CHLORIDE 0.9 % IR SOLN
80.0000 mg | Status: AC
  Administered 2017-06-26: 80 mg

## 2017-06-26 MED ORDER — ACETAMINOPHEN 325 MG PO TABS
325.0000 mg | ORAL_TABLET | ORAL | Status: DC | PRN
  Administered 2017-06-26 – 2017-06-27 (×2): 650 mg via ORAL
  Filled 2017-06-26 (×2): qty 2

## 2017-06-26 MED ORDER — ONDANSETRON HCL 4 MG/2ML IJ SOLN: 4.0000 mg | Freq: Four times a day (QID) | INTRAMUSCULAR | Status: DC | PRN

## 2017-06-26 MED ORDER — INSULIN DETEMIR 100 UNIT/ML ~~LOC~~ SOLN
36.0000 [IU] | Freq: Every day | SUBCUTANEOUS | Status: DC
  Administered 2017-06-26: 21:00:00 36 [IU] via SUBCUTANEOUS
  Filled 2017-06-26: qty 0.36

## 2017-06-26 MED ORDER — CEFAZOLIN SODIUM-DEXTROSE 2-4 GM/100ML-% IV SOLN
2.0000 g | INTRAVENOUS | Status: AC
  Administered 2017-06-26: 2 g via INTRAVENOUS

## 2017-06-26 MED ORDER — SODIUM CHLORIDE 0.9 % IR SOLN
Status: AC
  Filled 2017-06-26: qty 2

## 2017-06-26 MED ORDER — PANTOPRAZOLE SODIUM 40 MG PO TBEC
40.0000 mg | DELAYED_RELEASE_TABLET | Freq: Once | ORAL | Status: AC
  Administered 2017-06-26: 22:00:00 40 mg via ORAL
  Filled 2017-06-26: qty 1

## 2017-06-26 MED ORDER — LIDOCAINE HCL (PF) 1 % IJ SOLN
INTRAMUSCULAR | Status: DC | PRN
  Administered 2017-06-26: 45 mL

## 2017-06-26 MED ORDER — FENTANYL CITRATE (PF) 100 MCG/2ML IJ SOLN
INTRAMUSCULAR | Status: AC
  Filled 2017-06-26: qty 2

## 2017-06-26 MED ORDER — SODIUM CHLORIDE 0.9 % IV SOLN
INTRAVENOUS | Status: DC
  Administered 2017-06-26: 14:00:00 via INTRAVENOUS

## 2017-06-26 MED ORDER — GABAPENTIN 100 MG PO CAPS
200.0000 mg | ORAL_CAPSULE | Freq: Every day | ORAL | Status: DC
  Administered 2017-06-26: 22:00:00 200 mg via ORAL
  Filled 2017-06-26: qty 2

## 2017-06-26 MED ORDER — HEPARIN (PORCINE) IN NACL 2-0.9 UNIT/ML-% IJ SOLN
INTRAMUSCULAR | Status: AC
  Filled 2017-06-26: qty 500

## 2017-06-26 MED ORDER — FENTANYL CITRATE (PF) 100 MCG/2ML IJ SOLN
INTRAMUSCULAR | Status: AC
Start: 2017-06-26 — End: 2017-06-26
  Filled 2017-06-26: qty 2

## 2017-06-26 SURGICAL SUPPLY — 16 items
CABLE SURGICAL S-101-97-12 (CABLE) ×2 IMPLANT
CATH ACUITYPRO 45CM EH 9F (CATHETERS) ×2 IMPLANT
CATH HEX JOSEPH 2-5-2 65CM 6F (CATHETERS) ×2 IMPLANT
HOVERMATT SINGLE USE (MISCELLANEOUS) ×2 IMPLANT
ICD VIGILANT DF4 G247 (ICD Generator) ×2 IMPLANT
INGEVITY MRI 7741-52CM (Lead) ×2 IMPLANT
LEAD ACUITY X4 4674 (Lead) ×2 IMPLANT
LEAD PACING INGEVITY MRI 52CM (Lead) ×1 IMPLANT
LEAD RELIANCE G DF4 0293 (Lead) ×2 IMPLANT
PAD DEFIB LIFELINK (PAD) ×2 IMPLANT
SHEATH CLASSIC 7F (SHEATH) ×2 IMPLANT
SHEATH CLASSIC 9.5F (SHEATH) ×2 IMPLANT
SHEATH CLASSIC 9F (SHEATH) ×2 IMPLANT
SHIELD RADPAD SCOOP 12X17 (MISCELLANEOUS) ×2 IMPLANT
TRAY PACEMAKER INSERTION (PACKS) ×2 IMPLANT
WIRE ACUITY WHISPER EDS 4648 (WIRE) ×2 IMPLANT

## 2017-06-26 NOTE — Interval H&P Note (Signed)
History and Physical Interval Note:  06/26/2017 4:22 PM  Monica Solis  has presented today for surgery, with the diagnosis of chronic systolic heart failure, LBBB  The various methods of treatment have been discussed with the patient and family. After consideration of risks, benefits and other options for treatment, the patient has consented to  Procedure(s): BIV ICD INSERTION CRT-D (N/A) as a surgical intervention .  The patient's history has been reviewed, patient examined, no change in status, stable for surgery.  I have reviewed the patient's chart and labs.  Questions were answered to the patient's satisfaction.     Cristopher Peru

## 2017-06-26 NOTE — H&P (View-Only) (Signed)
HPI [name] is referred today by Dr. Gwenlyn Found for consideration of biventricular ICD/cardiac resynchronization therapy. The patient is a 56 year old morbidly obese woman who has a long history of left bundle branch block, chronic systolic heart failure, and no obstructive coronary disease by heart catheterization. Her ejection fraction is 30%, and she has left bundle branch block with a QRS duration of 160 ms. She has been on maximal medical therapy with beta blockers, and Enteresto. The patient has class II systolic heart failure symptoms. She has decided not to entertain bariatric surgery at this time. Allergies  Allergen Reactions  . Other Swelling    Metal  . Benadryl [Diphenhydramine Hcl] Hives  . Invokana [Canagliflozin] Other (See Comments)    Loss of bladder control   . Metformin Other (See Comments)    Kidney damage  . Pravastatin Other (See Comments)    Muscle ache  . Tramadol Other (See Comments)    "nightmares"  . Victoza [Liraglutide] Other (See Comments)    "Fainting"     Current Outpatient Prescriptions  Medication Sig Dispense Refill  . acetaminophen (TYLENOL) 500 MG tablet Take 500 mg by mouth daily as needed for headache.    . calcium carbonate (TUMS - DOSED IN MG ELEMENTAL CALCIUM) 500 MG chewable tablet Chew 1 tablet by mouth daily as needed for indigestion or heartburn.    . carvedilol (COREG) 12.5 MG tablet Take 1 tablet (12.5 mg total) by mouth 2 (two) times daily. 180 tablet 3  . Cholecalciferol (VITAMIN D3) 2000 UNITS capsule Take 4,000 Units by mouth daily with supper.     . fexofenadine (ALLEGRA) 180 MG tablet Take 180 mg by mouth daily as needed for allergies or rhinitis.    [name] gabapentin (NEURONTIN) 100 MG capsule Take 200 mg by mouth at bedtime.     . hydrochlorothiazide (HYDRODIURIL) 12.5 MG tablet Take 12.5 mg by mouth daily.    [name] ibuprofen (ADVIL,MOTRIN) 200 MG tablet Take 400 mg by mouth daily as needed (body pain).    . insulin aspart  (NOVOLOG) 100 UNIT/ML injection Inject 7-10 Units into the skin 3 (three) times daily as needed for high blood sugar. Per SSC 7-10units when cbg above 120    . insulin detemir (LEVEMIR) 100 UNIT/ML injection Inject 36 Units into the skin at bedtime.     . Iron-Vitamins (GERITOL COMPLETE) TABS Take 1 tablet by mouth daily with supper.     . levothyroxine (SYNTHROID, LEVOTHROID) 75 MCG tablet Take 75 mcg by mouth daily.    [name] omeprazole (PRILOSEC) 40 MG capsule Take 40 mg by mouth at bedtime.     . Probiotic Product (PROBIOTIC DAILY) CAPS Take 1 capsule by mouth daily with supper.     . sacubitril-valsartan (ENTRESTO) 49-51 MG Take 1 tablet by mouth 2 (two) times daily. 180 tablet 3   No current facility-administered medications for this visit.    Facility-Administered Medications Ordered in Other Visits  Medication Dose Route Frequency Provider Last Rate Last Dose  . technetium tetrofosmin (TC-MYOVIEW) injection 19.4 millicurie  17.4 millicurie Intravenous Once PRN Hilty, Nadean Corwin, MD         Past Medical History:  Diagnosis Date  . Abnormal uterine bleeding (AUB)   . At risk for sleep apnea    STOP-BANG=  5       SENT TO PCP 10-01-2016  . BMI 45.0-49.9, adult ([place])   . Diverticulosis of colon   . Endometrial polyp   . Factor  5 Leiden mutation, heterozygous ([place])    dx 2011  . History of DVT of lower extremity    2000  and 2011  . History of ectopic pregnancy   . Hypertension   . Left bundle branch block (LBBB)   . Peripheral neuropathy   . Type 2 diabetes mellitus treated with insulin (H[facility])     ROS:   All systems reviewed and negative except as noted in the HPI.   Past Surgical History:  Procedure Laterality Date  . COLONOSCOPY  2013  . DILATATION & CURETTAGE/HYSTEROSCOPY WITH MYOSURE N/A 10/05/2016   Procedure: [place];  Surgeon: Anastasio Auerbach, MD;  Location: [facility];  Service: Gynecology;  Laterality: N/A;   requests to follow 7:30am case  requests one hour OR time  . LAPAROSCOPY BILATERAL TUBAL LIGATION WITH FALOPE RINGS/  D&C HYSTEROSCOPY WITH POLYPECTOMY  12/06/2007   and Lysis Adhesions  . LEFT HEART CATH AND CORONARY ANGIOGRAPHY N/A 01/03/2017   Procedure: Left Heart Cath and Coronary Angiography;  Surgeon: Lorretta Harp, MD;  Location: [facility];  Service: Cardiovascular;  Laterality: N/A;     Family History  Problem Relation Age of Onset  . Hypertension Mother   . Heart disease Mother   . Alzheimer's disease Mother   . Heart disease Father   . Breast cancer Maternal Aunt        30's  . Diabetes Maternal Grandmother   . Heart disease Maternal Grandmother   . Heart disease Paternal Grandmother   . Cancer Maternal Grandfather        Unknown type  . Mitral valve prolapse Sister   . Heart disease Paternal Grandfather      Social History   Social History  . Marital status: Married    Spouse name: N/A  . Number of children: N/A  . Years of education: N/A   Occupational History  . Not on file.   Social History Main Topics  . Smoking status: Former Smoker    Packs/day: 0.00    Years: 2.00    Quit date: 10/02/1979  . Smokeless tobacco: Never Used  . Alcohol use No  . Drug use: No  . Sexual activity: Not on file     Comment: 1st intercourse 32 yo-1 partner-BTL   Other Topics Concern  . Not on file   Social History Narrative  . No narrative on file     BP 122/78   Pulse 69   Ht 5\' 9"  (1.753 m)   Wt (!) 328 lb 12.8 oz (149.1 kg)   BMI 48.56 kg/m   Physical Exam:  Obese appearing 56 year old woman, NAD HEENT: Unremarkable Neck:  6 cm JVD, no thyromegally Lymphatics:  No adenopathy Back:  No CVA tenderness Lungs:  Clear, with no wheezes, rales, or rhonchi HEART:  Regular rate rhythm, no murmurs, no rubs, no clicks Abd:  soft, positive bowel sounds, no organomegally, no rebound, no guarding Ext:  2 plus pulses, no edema, no cyanosis, no  clubbing Skin:  No rashes no nodules Neuro:  CN II through XII intact, motor grossly intact  EKG - normal sinus rhythm with left bundle branch block  Assess/Plan: 1. Chronic systolic heart failure - she is on maximal medical therapy. I've discussed the indications, risks, benefits, goals, and expectations of biventricular ICD implantation with the patient. She wishes to proceed. 2. Obesity - with her LV dysfunction, the patient currently is not interested in pursuing bariatric surgery.  Hopefully her pumping function will improve and if so potentially reconsider bariatric surgery. 3. Hypertension - she will continue her current medications and encouraged to maintain a low-sodium diet. Hopefully she will be able to lose weight.  Cristopher Peru, M.D.

## 2017-06-27 ENCOUNTER — Encounter (HOSPITAL_COMMUNITY): Payer: Self-pay | Admitting: Internal Medicine

## 2017-06-27 ENCOUNTER — Ambulatory Visit (HOSPITAL_COMMUNITY): Payer: 59

## 2017-06-27 DIAGNOSIS — I517 Cardiomegaly: Secondary | ICD-10-CM | POA: Diagnosis not present

## 2017-06-27 DIAGNOSIS — Z006 Encounter for examination for normal comparison and control in clinical research program: Secondary | ICD-10-CM | POA: Diagnosis not present

## 2017-06-27 DIAGNOSIS — I428 Other cardiomyopathies: Secondary | ICD-10-CM

## 2017-06-27 DIAGNOSIS — I429 Cardiomyopathy, unspecified: Secondary | ICD-10-CM | POA: Diagnosis not present

## 2017-06-27 DIAGNOSIS — I447 Left bundle-branch block, unspecified: Secondary | ICD-10-CM | POA: Diagnosis not present

## 2017-06-27 DIAGNOSIS — I110 Hypertensive heart disease with heart failure: Secondary | ICD-10-CM | POA: Diagnosis not present

## 2017-06-27 LAB — GLUCOSE, CAPILLARY: GLUCOSE-CAPILLARY: 142 mg/dL — AB (ref 65–99)

## 2017-06-27 MED ORDER — OFF THE BEAT BOOK
Freq: Once | Status: DC
  Filled 2017-06-27: qty 1

## 2017-06-27 MED FILL — Sodium Chloride Irrigation Soln 0.9%: Qty: 500 | Status: AC

## 2017-06-27 MED FILL — Gentamicin Sulfate Inj 40 MG/ML: INTRAMUSCULAR | Qty: 2 | Status: AC

## 2017-06-27 NOTE — Care Management Note (Signed)
Case Management Note  Patient Details  Name: Monica Solis MRN: [id number] Date of Birth: 1961-05-04  Subjective/Objective:   From home , s/p pace maker implant. For dc home no needs.                 Action/Plan: NCM will follow for dc needs.  Expected Discharge Date:  06/27/17               Expected Discharge Plan:  Home/Self Care  In-House Referral:     Discharge planning Services  CM Consult  Post Acute Care Choice:    Choice offered to:     DME Arranged:    DME Agency:     HH Arranged:    HH Agency:     Status of Service:  Completed, signed off  If discussed at [employer] of Stay Meetings, dates discussed:    Additional Comments:  Zenon Mayo, RN 06/27/2017, 10:06 AM

## 2017-06-27 NOTE — Discharge Instructions (Signed)
° ° °  Supplemental Discharge Instructions for  Pacemaker/Defibrillator Patients  Activity No heavy lifting or vigorous activity with your left/right arm for 6 to 8 weeks.  Do not raise your left/right arm above your head for one week.  Gradually raise your affected arm as drawn below.              06/30/17                     07/01/17                    07/02/17                  07/03/17 __  NO DRIVING for  1 week   ; you may begin driving on  0/10/93 .  WOUND CARE - Keep the wound area clean and dry.  Do not get this area wet for one week. No showers for one week; you may shower on  07/03/17  . - The tape/steri-strips on your wound will fall off; do not pull them off.  No bandage is needed on the site.  DO  NOT apply any creams, oils, or ointments to the wound area. - If you notice any drainage or discharge from the wound, any swelling or bruising at the site, or you develop a fever > 101? F after you are discharged home, call the office at once.  Special Instructions - You are still able to use cellular telephones; use the ear opposite the side where you have your pacemaker/defibrillator.  Avoid carrying your cellular phone near your device. - When traveling through airports, show security personnel your identification card to avoid being screened in the metal detectors.  Ask the security personnel to use the hand wand. - Avoid arc welding equipment, MRI testing (magnetic resonance imaging), TENS units (transcutaneous nerve stimulators).  Call the office for questions about other devices. - Avoid electrical appliances that are in poor condition or are not properly grounded. - Microwave ovens are safe to be near or to operate.  Additional information for defibrillator patients should your device go off: - If your device goes off ONCE and you feel fine afterward, notify the device clinic nurses. - If your device goes off ONCE and you do not feel well afterward, call 911. - If your device goes  off TWICE, call 911. - If your device goes off THREE times in one day, call 911.  DO NOT DRIVE YOURSELF OR A FAMILY MEMBER WITH A DEFIBRILLATOR TO THE HOSPITAL--CALL 911.

## 2017-06-27 NOTE — Discharge Summary (Signed)
ELECTROPHYSIOLOGY PROCEDURE DISCHARGE SUMMARY    Patient ID: Monica Solis,  MRN: [id number], DOB/AGE: Mar 22, 1961 56 y.o.  Admit date: 06/26/2017 Discharge date: 06/27/2017  Primary Care Physician: Reynold Bowen, MD  Primary Cardiologist: Dr. Gwenlyn Found Electrophysiologist: Dr. Lovena Le  Primary Discharge Diagnosis:  1. NICM 2. LBBB  Secondary Discharge Diagnosis:  1. Chronic CHF (systolic) 2. HTN 3. DM  Allergies  Allergen Reactions  . Crestor [Rosuvastatin Calcium] Other (See Comments)    Muscle aches   . Other Swelling    Metal  . Benadryl [Diphenhydramine Hcl] Hives  . Invokana [Canagliflozin] Other (See Comments)    Loss of bladder control   . Metformin Other (See Comments)    Kidney damage  . Pravastatin Other (See Comments)    Muscle ache  . Tramadol Other (See Comments)    "nightmares"  . Victoza [Liraglutide] Other (See Comments)    "Fainting"     Procedures This Admission:  1.  Implantation of a BSCi CRT-D ICD on 06/26/17 by Dr Lovena Le.  The patient received a Development worker, community (serial Number [id number]) biventricular ICDA [employer](serial number [phone]) LV lead [employer] (serial # [id number] ) right atrial lead and a [employer] (serial number [id number]) right ventricular defibrillator lead    DFT's were deferred at time of implant. There were no immediate post procedure complications. 2.  CXR on 06/27/17 demonstrated no pneumothorax status post device implantation.   Brief HPI: Monica Solis is a 56 y.o. female was referred to electrophysiology in the outpatient setting for consideration of ICD implantation.  Past medical history includes LBBB, NICM, HTN, HLD, DM.  The patient has persistent LV dysfunction despite guideline directed therapy.  Risks, benefits, and alternatives to ICD implantation were reviewed with the patient who wished to proceed.   Hospital Course:  The patient was admitted and underwent implantation of a CRT-D with details as outlined above.She was  monitored on telemetry overnight which demonstrated SR/V paced.  Left chest was without hematoma or ecchymosis.  The device was interrogated and found to be functioning normally.  CXR was obtained and demonstrated no pneumothorax status post device implantation.  Wound care, arm mobility, and restrictions were reviewed with the patient.  The patient was examined by Dr. Lovena Le and considered stable for discharge to home.   The patient's discharge medications include an ARB Delene Loll) and beta blocker (Carvedilol).   Physical Exam: Vitals:   06/27/17 0220 06/27/17 0420 06/27/17 0425 06/27/17 0729  BP: 117/62  123/78 137/74  Pulse: 72  66 77  Resp: 19  20 15   Temp:   98.2 F (36.8 C) 97.7 F (36.5 C)  TempSrc:   Oral Oral  SpO2: 99% 92% 93% 95%  Weight:   (!) 328 lb 0.7 oz (148.8 kg)   Height:        GEN- The patient is well appearing, alert and oriented x 3 today.   HEENT: normocephalic, atraumatic; sclera clear, conjunctiva pink; hearing intact; oropharynx clear Lungs- CTA b/l, normal work of breathing.  No wheezes, rales, rhonchi Heart- RRR, no murmurs, rubs or gallops, PMI not laterally displaced GI- soft, non-tender, non-distended Extremities- no clubbing, cyanosis, or edema MS- no significant deformity or atrophy Skin- warm and dry, no rash or lesion, left chest without hematoma/ecchymosis Psych- euthymic mood, full affect Neuro- no gross defecits  Labs:   Lab Results  Component Value Date   WBC 7.3 06/21/2017   HGB 15.3 06/21/2017   HCT 45.2 06/21/2017   MCV 89  06/21/2017   PLT 261 06/21/2017     Recent Labs Lab 06/21/17 1214  NA 140  K 4.7  CL 101  CO2 25  BUN 16  CREATININE 0.70  CALCIUM 9.8  GLUCOSE 103*    Discharge Medications:  Allergies as of 06/27/2017      Reactions   Crestor [rosuvastatin Calcium] Other (See Comments)   Muscle aches    Other Swelling   Metal   Benadryl [diphenhydramine Hcl] Hives   Invokana [canagliflozin] Other (See  Comments)   Loss of bladder control   Metformin Other (See Comments)   Kidney damage   Pravastatin Other (See Comments)   Muscle ache   Tramadol Other (See Comments)   "nightmares"   Victoza [liraglutide] Other (See Comments)   "Fainting"      Medication List    TAKE these medications   ASPERCREME EX Apply 1 application topically 2 (two) times daily as needed.   BENGAY EX Apply 1 application topically 2 (two) times daily as needed (pain).   calcium carbonate 500 MG chewable tablet Commonly known as:  TUMS - dosed in mg elemental calcium Chew 1 tablet by mouth daily as needed for indigestion or heartburn.   carvedilol 12.5 MG tablet Commonly known as:  COREG Take 12.5 mg by mouth 2 (two) times daily with a meal. What changed:  Another medication with the same name was removed. Continue taking this medication, and follow the directions you see here.   fexofenadine 180 MG tablet Commonly known as:  ALLEGRA Take 180 mg by mouth daily as needed for allergies or rhinitis.   gabapentin 100 MG capsule Commonly known as:  NEURONTIN Take 200 mg by mouth at bedtime.   GERITOL COMPLETE Tabs Take 1 tablet by mouth daily with supper.   hydrochlorothiazide 12.5 MG tablet Commonly known as:  HYDRODIURIL Take 12.5 mg by mouth daily as needed (fluid).   insulin aspart 100 UNIT/ML injection Commonly known as:  novoLOG Inject 7-10 Units into the skin 3 (three) times daily as needed for high blood sugar. Per SSC 7-10units when cbg above 120   insulin detemir 100 UNIT/ML injection Commonly known as:  LEVEMIR Inject 36 Units into the skin at bedtime.   levothyroxine 75 MCG tablet Commonly known as:  SYNTHROID, LEVOTHROID Take 75 mcg by mouth daily before breakfast.   omeprazole 40 MG capsule Commonly known as:  PRILOSEC Take 40 mg by mouth at bedtime.   PROBIOTIC DAILY Caps Take 1 capsule by mouth daily with supper.   sacubitril-valsartan 49-51 MG Commonly known as:   ENTRESTO Take 1 tablet by mouth 2 (two) times daily.   Vitamin D3 2000 units capsule Take 4,000 Units by mouth daily with supper.            Discharge Care Instructions        Start     Ordered   06/27/17 0000  Increase activity slowly     06/27/17 0901   06/27/17 0000  Diet - low sodium heart healthy     06/27/17 0901      Disposition:   Home Discharge Instructions    Diet - low sodium heart healthy    Complete by:  As directed    Increase activity slowly    Complete by:  As directed      Follow-up Information    [facility] Follow up on 07/10/2017.   Specialty:  Cardiology Why:  9:00AM, wound check Contact information: [address] [place]       Evans Lance, MD Follow up on 09/27/2017.   Specialty:  Cardiology Why:  10:00AM Contact information: [address] [address] [phone]           Duration of Discharge Encounter: Greater than 30 minutes including physician time.  Venetia Night, PA-C 06/27/2017 9:01 AM  EP Attending  Patient seen and examined. Agree with above. The patient is s/p BiV ICD and her device interogation performed under my direction looks good. She will be discharged home with usual followup.  Mikle Bosworth.D.

## 2017-06-28 ENCOUNTER — Telehealth: Payer: Self-pay | Admitting: Cardiovascular Disease

## 2017-06-28 NOTE — Telephone Encounter (Signed)
New message   Pt calling and states she thinks she received a call about her handicap plaque and would like a call back

## 2017-06-28 NOTE — Telephone Encounter (Signed)
Spoke to patient - unable to locate signed handicapped-- informed patient will defer to Dr Kennon Holter CMA  Will contact next week - okay for husband to pick up form for patient.

## 2017-07-03 ENCOUNTER — Telehealth: Payer: Self-pay

## 2017-07-03 NOTE — Telephone Encounter (Signed)
Spoke to wife Dr.Berry signed handicapp placard,left at [employer] office front desk

## 2017-07-08 NOTE — Telephone Encounter (Signed)
Attempted to call pt. Left message to call back on home vm---Handicap placard paperwork is ready for pick up. Will attempt to call again and wait for call back.

## 2017-07-10 ENCOUNTER — Telehealth: Payer: Self-pay | Admitting: *Deleted

## 2017-07-10 ENCOUNTER — Ambulatory Visit (INDEPENDENT_AMBULATORY_CARE_PROVIDER_SITE_OTHER): Payer: 59 | Admitting: *Deleted

## 2017-07-10 DIAGNOSIS — I5022 Chronic systolic (congestive) heart failure: Secondary | ICD-10-CM

## 2017-07-10 LAB — CUP PACEART INCLINIC DEVICE CHECK
HIGH POWER IMPEDANCE MEASURED VALUE: 63 Ohm
Implantable Lead Implant Date: 20180912
Implantable Lead Location: 753859
Implantable Lead Model: 293
Implantable Lead Model: 4674
Implantable Lead Model: 7741
Implantable Lead Serial Number: 435131
Implantable Lead Serial Number: 813037
Implantable Pulse Generator Implant Date: 20180912
Lead Channel Impedance Value: 504 Ohm
Lead Channel Impedance Value: 516 Ohm
Lead Channel Pacing Threshold Amplitude: 0.6 V
Lead Channel Sensing Intrinsic Amplitude: 25 mV
Lead Channel Sensing Intrinsic Amplitude: 3.3 mV
Lead Channel Setting Pacing Amplitude: 3.5 V
Lead Channel Setting Pacing Amplitude: 3.5 V
Lead Channel Setting Pacing Pulse Width: 0.4 ms
Lead Channel Setting Sensing Sensitivity: 0.5 mV
Lead Channel Setting Sensing Sensitivity: 1 mV
MDC IDC LEAD IMPLANT DT: 20180912
MDC IDC LEAD IMPLANT DT: 20180912
MDC IDC LEAD LOCATION: 753858
MDC IDC LEAD LOCATION: 753860
MDC IDC LEAD SERIAL: 920623
MDC IDC MSMT LEADCHNL LV PACING THRESHOLD AMPLITUDE: 1.4 V
MDC IDC MSMT LEADCHNL LV PACING THRESHOLD PULSEWIDTH: 0.4 ms
MDC IDC MSMT LEADCHNL LV SENSING INTR AMPL: 10.1 mV
MDC IDC MSMT LEADCHNL RA IMPEDANCE VALUE: 713 Ohm
MDC IDC MSMT LEADCHNL RA PACING THRESHOLD PULSEWIDTH: 0.4 ms
MDC IDC MSMT LEADCHNL RV PACING THRESHOLD AMPLITUDE: 0.4 V
MDC IDC MSMT LEADCHNL RV PACING THRESHOLD PULSEWIDTH: 0.4 ms
MDC IDC PG SERIAL: 193993
MDC IDC SESS DTM: 20180926040000
MDC IDC SET LEADCHNL LV PACING PULSEWIDTH: 0.4 ms
MDC IDC SET LEADCHNL RA PACING AMPLITUDE: 3.5 V

## 2017-07-10 NOTE — Progress Notes (Signed)
Wound check appointment. Steri-strips removed. Wound without redness or edema. Incision edges approximated, wound well healed. Normal device function. Thresholds, sensing, and impedances consistent with implant measurements. Device programmed at 3.5V for extra safety margin until 3 month visit. Histogram distribution appropriate for patient and level of activity. No mode switches or ventricular arrhythmias noted. Patient educated about wound care, arm mobility, lifting restrictions, shock plan. ROV with GT 09/27/17.

## 2017-07-10 NOTE — Telephone Encounter (Signed)
Spoke with patient regarding receiving Remote transmission. Patient verbalized understanding and appreciation.

## 2017-07-12 NOTE — Telephone Encounter (Signed)
Called pt. Pt stated she picked up the paperwork. Nothing further needed at this time.

## 2017-08-07 DIAGNOSIS — E1151 Type 2 diabetes mellitus with diabetic peripheral angiopathy without gangrene: Secondary | ICD-10-CM | POA: Diagnosis not present

## 2017-08-07 DIAGNOSIS — I428 Other cardiomyopathies: Secondary | ICD-10-CM | POA: Diagnosis not present

## 2017-08-07 DIAGNOSIS — Z1389 Encounter for screening for other disorder: Secondary | ICD-10-CM | POA: Diagnosis not present

## 2017-08-07 DIAGNOSIS — I10 Essential (primary) hypertension: Secondary | ICD-10-CM | POA: Diagnosis not present

## 2017-08-14 DIAGNOSIS — Z23 Encounter for immunization: Secondary | ICD-10-CM | POA: Diagnosis not present

## 2017-08-29 ENCOUNTER — Telehealth: Payer: Self-pay | Admitting: Cardiovascular Disease

## 2017-08-29 NOTE — Telephone Encounter (Signed)
Pt states that "healthcare nurse" told her to call and report that she has been having low BP "all month"  Today 96/62 HR 63 11-14 113/63 HR 66 111/66 HR 67 11-13 125/74 HR 73 118/63 HR 77 11-12 118/71 HR 60 124/74 HR 73 11-11 106/61 HR 66 116/66 HR 68 11-09  90/60    11-7    80/50 HR 78 Informed pt that these number are very good readings but will forward to Dr Gwenlyn Found for his input

## 2017-08-29 NOTE — Telephone Encounter (Signed)
Blood pressure readings are very good

## 2017-08-29 NOTE — Telephone Encounter (Signed)
Please call,pt says she is concerned,getting some low blood pressure readings.

## 2017-09-09 ENCOUNTER — Ambulatory Visit: Payer: 59 | Admitting: Neurology

## 2017-09-09 ENCOUNTER — Encounter: Payer: Self-pay | Admitting: Neurology

## 2017-09-09 VITALS — BP 119/74 | HR 65 | Ht 69.0 in | Wt 334.0 lb

## 2017-09-09 DIAGNOSIS — Z95810 Presence of automatic (implantable) cardiac defibrillator: Secondary | ICD-10-CM | POA: Insufficient documentation

## 2017-09-09 DIAGNOSIS — I82412 Acute embolism and thrombosis of left femoral vein: Secondary | ICD-10-CM

## 2017-09-09 DIAGNOSIS — I5023 Acute on chronic systolic (congestive) heart failure: Secondary | ICD-10-CM | POA: Diagnosis not present

## 2017-09-09 DIAGNOSIS — E039 Hypothyroidism, unspecified: Secondary | ICD-10-CM | POA: Diagnosis not present

## 2017-09-09 DIAGNOSIS — G4734 Idiopathic sleep related nonobstructive alveolar hypoventilation: Secondary | ICD-10-CM | POA: Diagnosis not present

## 2017-09-09 DIAGNOSIS — I428 Other cardiomyopathies: Secondary | ICD-10-CM | POA: Diagnosis not present

## 2017-09-09 DIAGNOSIS — G4700 Insomnia, unspecified: Secondary | ICD-10-CM | POA: Diagnosis not present

## 2017-09-09 DIAGNOSIS — I2699 Other pulmonary embolism without acute cor pulmonale: Secondary | ICD-10-CM | POA: Insufficient documentation

## 2017-09-09 NOTE — Progress Notes (Signed)
SLEEP MEDICINE CLINIC   Provider:  Larey Seat, M D  Primary Care Physician:  Reynold Bowen, MD   Referring Provider: Reynold Bowen, MD , Endocrinologist at [facility].    Chief Complaint  Patient presents with  . New Patient (Initial Visit)    Room 11, paper referral, Dr. Reynold Bowen    HPI:  Monica Solis is a 56 y.o. female , seen here as in a referral  from Dr. Forde Dandy for a sleep evaluation- she reports feeling exhausted and strained by minor activity. Her STOP Bang score was 5 - urging an evaluation.   Monica Solis is a 56 year old, right-handed Caucasian female patient, who presents with a significant diagnosis of nonischemic cardiac myopathy.  She has a history of high blood pressure, high cholesterol, diabetes, GERD, lower extremity dependent edema, left bundle branch block and factor V Leiden hypothyroidism diverticulosis uterine fibroids, she had a cardiac defibrillator implanted on June 26, 2017. She reports getting a little bit more strength back now. Her next cardiology appointment is on Dec 14th 2018, Dr Crissie Sickles, electrophysiology.  After the implantation in hospital she was recorded having hypoxemia and irregular breathing.  " In 2011 I was diagnosed with EF 55% and LBBB.  I had pain in my chest throughout  2017 and early 2018- and was told it was  GERD. I was asking for a referral to bariatric surgery and there I was diagnosed with CHF class 3- and changed doctors. The heart failure was just diagnosed earlier this year".  Monica Solis also stated that her mother suffers from a cardiomyopathy. She follows Quay Burow, MD for cardiology .   Sleep habits are as follows:  The patient usually watches TV in the den before retreating to her bedroom between 11 and 11:30 PM.  She may still read for a while, and if she cannot fall asleep within 30-60 minutes she will get back up.  Lately, she has had frequently difficulties initiating sleep within an hour.   She would go back to the den.  She may play on her iPad or watch TV again until she feels sleepy enough to try.  Between 2 and 3 AM she is usually asleep.  She was troubled by nocturia before the defibrillator was implanted, now she only rises once at night.  She usually can sleep through until 5 AM when her husband rises. She may get another 1-2 hours of sleep. In her working years she rose at 5 AM.   She always wakes with a very dry mouth, and headaches  ( dull and throbbing) and frequently feeling clammy, with night sweats, palpitations.    Sleep medical history and family sleep history: mother with cardiomyopathy. Mother has OSA on CPAP.   She wakes sometimes from gasping. No tonsillectomy,  No sleep walking. No snoring. She has DM and hypothyroidism.   Social history:  Married, the patient smoked for short time during her teenage, but not as an adult and this is more than 40 years ago.  She may drink 1 alcoholic beverage a year, if any. Caffeine - one cup in AM, no soda and no longer sweet tea, no energy drinks.   Review of Systems: Out of a complete 14 system review, the patient complains of only the following symptoms, and all other reviewed systems are negative.  Choking, gasping, clammy, palpitations, edema, weight gain over the last 7 years started at 328 at the time of DM diagnosis in 2011, lost 55 pounds  and regained it all. [name]  Epworth score 3 , Fatigue severity score  40 points - elevated  , depression score  N/a    I reviewed the patient's current medication list, she is on NovoLog insulin, Levemir flex E touch, Entresto, Voltaren gel, omeprazole, levothyroxine 75 mcg, HCTZ at 12.5 mg, fexofenadine during allergy season, she takes Tylenol as needed, Neurontin 100 mg 2 capsules by mouth daily, carvedilol twice a day,  The patient's laboratory results from Dr. Forde Dandy office are also available to me, she does have hyperlipidemia with a total cholesterol level of 211, vitamin D deficiency  at 33.7, hypertension is not uncontrolled her blood pressure was 136/90 in his office, her HbA1c was 5.8, her last ejection fraction measured by echocardiogram through her cardiologist was 30%.  Social History   Socioeconomic History  . Marital status: Married    Spouse name: Not on file  . Number of children: Not on file  . Years of education: Not on file  . Highest education level: Not on file  Social Needs  . Financial resource strain: Not on file  . Food insecurity - worry: Not on file  . Food insecurity - inability: Not on file  . Transportation needs - medical: Not on file  . Transportation needs - non-medical: Not on file  Occupational History  . Not on file  Tobacco Use  . Smoking status: Former Smoker    Packs/day: 0.10    Years: 2.00    Pack years: 0.20    Types: Cigarettes    Last attempt to quit: 10/02/1979    Years since quitting: 37.9  . Smokeless tobacco: Never Used  Substance and Sexual Activity  . Alcohol use: No    Alcohol/week: 0.0 oz  . Drug use: No  . Sexual activity: Not Currently    Partners: Male    Birth control/protection: Surgical    Comment: 1st intercourse 32 yo-1 partner-BTL  Other Topics Concern  . Not on file  Social History Narrative  . Not on file    Family History  Problem Relation Age of Onset  . Hypertension Mother   . Heart disease Mother   . Alzheimer's disease Mother   . Heart disease Father   . Breast cancer Maternal Aunt        30's  . Diabetes Maternal Grandmother   . Heart disease Maternal Grandmother   . Heart disease Paternal Grandmother   . Cancer Maternal Grandfather        Unknown type  . Mitral valve prolapse Sister   . Heart disease Paternal Grandfather     Past Medical History:  Diagnosis Date  . Abnormal uterine bleeding (AUB)   . AICD (automatic cardioverter/defibrillator) present   . At risk for sleep apnea    STOP-BANG=  5       SENT TO PCP 10-01-2016  . Blood clot in vein    "superficial blood  clots in BLE; I had 6-7 since 2000" (06/26/2017)  . BMI 45.0-49.9, adult ([place])   . Chronic bronchitis ([place])   . Diverticulosis of colon   . Endometrial polyp   . Factor 5 Leiden mutation, heterozygous ([place])    dx 2011  . GERD (gastroesophageal reflux disease)   . High cholesterol   . History of DVT of lower extremity 2000; 2011   "I've had them in both"  . History of ectopic pregnancy 1998  . Hypertension   . Hypothyroidism   . Left bundle branch block (LBBB)   .  Osteoarthritis    "all over" (06/26/2017)  . Peripheral neuropathy   . PONV (postoperative nausea and vomiting)   . Type 2 diabetes mellitus treated with insulin [facility])     Past Surgical History:  Procedure Laterality Date  . BI-VENTRICULAR IMPLANTABLE CARDIOVERTER DEFIBRILLATOR  (CRT-D)  06/26/2017  . BIV ICD INSERTION CRT-D N/A 06/26/2017   Procedure: BIV ICD INSERTION CRT-D;  Surgeon: Evans Lance, MD;  Location: [place] [facility];  Service: Cardiovascular;  Laterality: N/A;  . COLONOSCOPY  2013  . DILATATION & CURETTAGE/HYSTEROSCOPY WITH MYOSURE N/A 10/05/2016   Procedure: [place];  Surgeon: Anastasio Auerbach, MD;  Location: [place];  Service: Gynecology;  Laterality: N/A;  requests to follow 7:30am caserequests one hour OR time  . DILATION AND CURETTAGE OF UTERUS  12/06/2007  . LAPAROSCOPY BILATERAL TUBAL LIGATION WITH FALOPE RINGS/  D&C HYSTEROSCOPY WITH POLYPECTOMY  12/06/2007   and Lysis Adhesions  . LEFT HEART CATH AND CORONARY ANGIOGRAPHY N/A 01/03/2017   Procedure: Left Heart Cath and Coronary Angiography;  Surgeon: Lorretta Harp, MD;  Location: [place] [facility];  Service: Cardiovascular;  Laterality: N/A;  . TUBAL LIGATION  12/06/2007    Current Outpatient Medications  Medication Sig Dispense Refill  . calcium carbonate (TUMS - DOSED IN MG ELEMENTAL CALCIUM) 500 MG chewable tablet Chew 1 tablet by mouth daily as needed for indigestion or  heartburn.    . carvedilol (COREG) 12.5 MG tablet Take 12.5 mg by mouth 2 (two) times daily with a meal.    . Cholecalciferol (VITAMIN D3) 2000 UNITS capsule Take 4,000 Units by mouth daily with supper.     . fexofenadine (ALLEGRA) 180 MG tablet Take 180 mg by mouth daily as needed for allergies or rhinitis.    [name] gabapentin (NEURONTIN) 100 MG capsule Take 200 mg by mouth at bedtime.     . hydrochlorothiazide (HYDRODIURIL) 12.5 MG tablet Take 12.5 mg by mouth daily as needed (fluid).     . insulin aspart (NOVOLOG) 100 UNIT/ML injection Inject 7-10 Units into the skin 3 (three) times daily as needed for high blood sugar. Per SSC 7-10units when cbg above 120    . insulin detemir (LEVEMIR) 100 UNIT/ML injection Inject 36 Units into the skin at bedtime.     . Iron-Vitamins (GERITOL COMPLETE) TABS Take 1 tablet by mouth daily with supper.     . levothyroxine (SYNTHROID, LEVOTHROID) 75 MCG tablet Take 75 mcg by mouth daily before breakfast.     . Menthol, Topical Analgesic, (BENGAY EX) Apply 1 application topically 2 (two) times daily as needed (pain).    [name] omeprazole (PRILOSEC) 40 MG capsule Take 40 mg by mouth at bedtime.     . Probiotic Product (PROBIOTIC DAILY) CAPS Take 1 capsule by mouth daily with supper.     . sacubitril-valsartan (ENTRESTO) 49-51 MG Take 1 tablet by mouth 2 (two) times daily. 180 tablet 3  . Trolamine Salicylate (ASPERCREME EX) Apply 1 application topically 2 (two) times daily as needed.     No current facility-administered medications for this visit.    Facility-Administered Medications Ordered in Other Visits  Medication Dose Route Frequency Provider Last Rate Last Dose  . technetium tetrofosmin (TC-MYOVIEW) injection 93.7 millicurie  34.2 millicurie Intravenous Once PRN Pixie Casino, MD        Allergies as of 09/09/2017 - Review Complete 09/09/2017  Allergen Reaction Noted  . Crestor [rosuvastatin calcium] Other (See Comments) 06/24/2017  .  Other Swelling  01/01/2017  . Benadryl [diphenhydramine hcl] Hives 07/10/2011  . Invokana [canagliflozin] Other (See Comments) 09/14/2016  . Metformin Other (See Comments) 05/29/2016  . Pravastatin Other (See Comments) 08/10/2014  . Tramadol Other (See Comments) 10/01/2016  . Victoza [liraglutide] Other (See Comments) 09/14/2016    Vitals: BP 119/74   Pulse 65   Ht '5\' 9"'$  (1.753 m)   Wt (!) 334 lb (151.5 kg)   BMI 49.32 kg/m  Last Weight:  Wt Readings from Last 1 Encounters:  09/09/17 (!) 334 lb (151.5 kg)   CWC:BJSE mass index is 49.32 kg/m.     Last Height:   Ht Readings from Last 1 Encounters:  09/09/17 '5\' 9"'$  (1.753 m)    Physical exam:  General: The patient is awake, alert and appears not in acute distress. The patient is well groomed. Head: Normocephalic, atraumatic. Neck is supple. Mallampati 2- midline.   neck circumference:17. Nasal airflow patent , a TMJ click is not  evident . Retrognathia is not seen. Crowded lower jaw( dentition)   Cardiovascular:  Regular rate and rhythm , without murmurs or carotid bruit, and without distended neck veins. Respiratory: Lungs are clear to auscultation. Skin:  Ankle  edema, no rash Trunk: BMI is close to 50. The patient's posture is stooped. Shoulders droopy.   Neurologic exam : Attention span & concentration ability appears normal.  Speech is fluent, without dysarthria, dysphonia or aphasia.  Mood and affect are appropriate.  Cranial nerves: Pupils are equal and briskly reactive to light. Funduscopic exam without evidence of pallor or edema. Extraocular movements  in vertical and horizontal planes intact and without nystagmus. Visual fields by finger perimetry are intact. Hearing to finger rub intact.  Facial sensation intact to fine touch.  Facial motor strength is symmetric, she has PTOSIS_  and tongue and uvula move midline. Shoulder droopy,  Motor exam: Normal tone, muscle bulk and symmetric strength in all extremities. Sensory:  Fine  touch, pinprick and vibration were tested in all extremities. Proprioception tested in the upper extremities was normal. Coordination: Rapid alternating movements in the fingers/hands was normal. Finger-to-nose maneuver  normal without evidence of ataxia, dysmetria or tremor.  Gait and station: Patient walks without assistive device .Turns with 3  Steps. Romberg testing is negative.  Deep tendon reflexes: in the  upper  extremities are symmetric and intact.  Patella attenuated, achilles absent. Babinski maneuver response is attenuated    Assessment:  After physical and neurologic examination, review of laboratory studies,  Personal review of imaging studies, reports of other /same  Imaging studies, results of polysomnography and / or neurophysiology testing and pre-existing records as far as provided in visit., my assessment is   Since the year 2000 the patient multiple blood clots, she reports no deep venous thrombosis, she has been tested for factor V Leiden and is positive.  She has hypothyroidism, she has diabetes, she has reached the border zone from morbid obesity to super obesity as her BMI is very close to 50.  She had congestive heart failure her ejection fraction is 30%, this alone will make her prone to have central or obstructive apneas.  The defibrillator will prevent tachycardia,, pacemaker component will prevent her from having significant bradycardia.  It should help with reducing the ankle edema and she has been on HCTZ at AM.   1) OSA or CSA- complex apnea with hypoxemia. 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   2) Morbid obesity with serious co-morbidities.  In addition I would like for Monica Solis to  consider further weight management by medical means before she resorts to a bariatric surgery.  If she likes I will refer her to Dr. Redgie Grayer with the  medical weight management at [facility]. She has diverticulitis which made her limit her choice of vegetables and fruits .    3) she sleeps on an incline - elevated head of bed - by wedge- 12 inches. In adition to 3-4 pillows, orthopnea.    The patient was advised of the nature of the diagnosed disorder , the treatment options and the  risks for general health and wellness arising from not treating the condition. She was observed after surgery to have prolonged hypoxia and irregular breathing, but did not snore.   Unfortunately, I cannot do this attended sleep study in the calendar year 2018 anymore.  For this reason and because she has met her deductible I will send her home with a watch Pat home sleep test before 12-20 2018. .  This allows [place] to initiate some treatment within this calendar year, hopefully.    I spent more than 50 minutes of face to face time with the patient.  Greater than 50% of time was spent in counseling and coordination of care. We have discussed the diagnosis and differential and I answered the patient's questions.    Plan:  Treatment plan and additional workup :  HST Watch pat . ASAP-   Larey Seat, MD [id number]/49/6759, 1:63 AM  Certified in Neurology by ABPN Certified in [place] by [facility] [address], [place], [place] [address]

## 2017-09-16 ENCOUNTER — Encounter: Payer: 59 | Admitting: Gynecology

## 2017-09-16 ENCOUNTER — Encounter: Payer: Self-pay | Admitting: Gynecology

## 2017-09-16 ENCOUNTER — Ambulatory Visit (INDEPENDENT_AMBULATORY_CARE_PROVIDER_SITE_OTHER): Payer: 59 | Admitting: Gynecology

## 2017-09-16 VITALS — BP 126/78 | Ht 69.0 in | Wt 328.0 lb

## 2017-09-16 DIAGNOSIS — Z01419 Encounter for gynecological examination (general) (routine) without abnormal findings: Secondary | ICD-10-CM

## 2017-09-16 NOTE — Patient Instructions (Signed)
Follow-up in 1 year for annual exam, sooner if any issues. 

## 2017-09-16 NOTE — Progress Notes (Signed)
    Monica Solis 01/19/1961 [id number]        55 y.o.  G1P0010 for annual gynecologic exam.  Doing well from a gynecologic standpoint.  History of bleeding last year and underwent hysteroscopy D&C with resection of benign endometrial polyp.  Has done no bleeding since then.  Past medical history,surgical history, problem list, medications, allergies, family history and social history were all reviewed and documented as reviewed in the EPIC chart.  ROS:  Performed with pertinent positives and negatives included in the history, assessment and plan.   Additional significant findings : None   Exam: [name] assistant Vitals:   09/16/17 1201  BP: 126/78  Weight: (!) 328 lb (148.8 kg)  Height: 5\' 9"  (1.753 m)   Body mass index is 48.44 kg/m.  General appearance:  Normal affect, orientation and appearance. Skin: Grossly normal HEENT: Without gross lesions.  No cervical or supraclavicular adenopathy. Thyroid normal.  Lungs:  Clear without wheezing, rales or rhonchi Cardiac: RR, without RMG Abdominal:  Soft, nontender, without masses, guarding, rebound, organomegaly or hernia Breasts:  Examined lying and sitting without masses, retractions, discharge or axillary adenopathy. Pelvic:  Ext, BUS, Vagina: Normal  Cervix: Normal  Uterus: Occult to palpate but no gross masses or tenderness  Adnexa: Without gross masses or tenderness    Anus and perineum: Normal   Rectovaginal: Normal sphincter tone without palpated masses or tenderness.    Assessment/Plan:  56 y.o. G89P0010 female for annual gynecologic exam.   1. Menopausal.  Without menses since last year after her hysteroscopy D&C for endometrial polyp.  No significant hot flushes, night sweats.  Continue to monitor and report any issues or bleeding. 2. Pap smear/HPV 2015.  No Pap smear done today.  No history of significant abnormal Pap smears.  Plan repeat Pap smear at 5-year interval per current screening guidelines. 3. Colonoscopy  2013.  Repeat at their recommended interval. 4. Mammography due this month and I gave her a request slip for a screening mammogram to [facility] at [facility].  Breast exam normal today. 5. DEXA never.  Will plan further into the menopause. 6. Health maintenance.  No routine lab work done as patient does this elsewhere.  Follow-up 1 year, sooner as needed.   Anastasio Auerbach MD, 12:27 PM 09/16/2017

## 2017-09-18 ENCOUNTER — Institutional Professional Consult (permissible substitution): Payer: 59 | Admitting: Neurology

## 2017-09-25 ENCOUNTER — Ambulatory Visit (INDEPENDENT_AMBULATORY_CARE_PROVIDER_SITE_OTHER): Payer: 59 | Admitting: Neurology

## 2017-09-25 ENCOUNTER — Telehealth: Payer: Self-pay | Admitting: Cardiovascular Disease

## 2017-09-25 DIAGNOSIS — G4733 Obstructive sleep apnea (adult) (pediatric): Secondary | ICD-10-CM | POA: Diagnosis not present

## 2017-09-25 DIAGNOSIS — E039 Hypothyroidism, unspecified: Secondary | ICD-10-CM

## 2017-09-25 DIAGNOSIS — I428 Other cardiomyopathies: Secondary | ICD-10-CM

## 2017-09-25 DIAGNOSIS — Z95810 Presence of automatic (implantable) cardiac defibrillator: Secondary | ICD-10-CM

## 2017-09-25 DIAGNOSIS — G4700 Insomnia, unspecified: Secondary | ICD-10-CM

## 2017-09-25 NOTE — Telephone Encounter (Signed)
New Message  Pt call requesting to speak with RN. Pt states her PCP Dr. Forde Dandy changes her coreg from 12.5mg  to 6.5mg  due to her low bp. Pt states since the change she has not had any further bp issues. Please call back to discuss if needed. bs

## 2017-09-25 NOTE — Telephone Encounter (Signed)
Returned call to pt she states that her BP has been running too low her BP was 80/60 and 70/40 last week and since changing Friday afternoon 112/60. Dr. Forde Dandy changed her coreg from 12.5mg  BID to 6.5mg  BID due to her low BP.

## 2017-09-27 ENCOUNTER — Encounter: Payer: 59 | Admitting: Internal Medicine

## 2017-10-05 ENCOUNTER — Other Ambulatory Visit: Payer: Self-pay | Admitting: Neurology

## 2017-10-05 DIAGNOSIS — I454 Nonspecific intraventricular block: Secondary | ICD-10-CM

## 2017-10-05 DIAGNOSIS — G4733 Obstructive sleep apnea (adult) (pediatric): Secondary | ICD-10-CM

## 2017-10-05 DIAGNOSIS — R809 Proteinuria, unspecified: Secondary | ICD-10-CM

## 2017-10-05 DIAGNOSIS — D6851 Activated protein C resistance: Secondary | ICD-10-CM

## 2017-10-05 NOTE — Procedures (Signed)
[facility] [address] [place], [place] [address] NAME: Monica Solis                                                       DOB: Dec 21, 1960 MEDICAL RECORD NUMBER [id number]                                       DOS: 09/25/2017 REFERRING PHYSICIAN: Reynold Bowen, M.D.  STUDY PERFORMED: HST watch pat  HISTORY: Her STOP Bang score was 5 - urging an evaluation. [name] is a 56 year old, right-handed Caucasian female patient, who presents with a significant diagnosis of non-ischemic cardiac myopathy.  She has a history of high blood pressure, high cholesterol, diabetes, GERD, lower extremity dependent edema, left bundle branch block and factor V Leiden, hypothyroidism, diverticulosis, uterine fibroids, she had a cardiac defibrillator implanted on June 26, 2017.  While in hospital she was recorded having hypoxemia and irregular breathing. Epworth score 3 points, Fatigue severity score 40 points - BMI: 49.6  STUDY RESULTS: Total Recording Time: 7 hours 28 minutes Total Apnea/Hypopnea Index (AHI):  18.1 /hour; RDI 18.3/hr.  Average Oxygen Saturation:  92 %; Lowest Oxygen Desaturation:  79%  Total Time Oxygen Saturation Below 89%: 5.5% =19.9 minutes  Average Heart Rate: 72 bpm (55-92)   IMPRESSION: Mild- Moderate obstructive sleep apnea, no central apneas emerging.  Desaturation time was low. REM sleep accentuated apnea.  RECOMMENDATION:  CPAP is recommended for REM dependent apnea and cardiac arrhythmia or failure patients. I will order auto CPAP 5-15 cm water, heated humidity and interface of choice.  I certify that I have reviewed the raw data recording prior to the issuance of this report in accordance with the standards of the American Academy of Sleep Medicine (AASM). Larey Seat, M.D.      10-05-2017  Medical Director of [facility] at [facility], [place], [place] and [facility] and accredited by AASM

## 2017-10-07 ENCOUNTER — Ambulatory Visit: Payer: 59 | Admitting: Internal Medicine

## 2017-10-07 ENCOUNTER — Encounter: Payer: Self-pay | Admitting: Internal Medicine

## 2017-10-07 VITALS — BP 110/80 | HR 64 | Ht 69.0 in | Wt 332.6 lb

## 2017-10-07 DIAGNOSIS — I447 Left bundle-branch block, unspecified: Secondary | ICD-10-CM | POA: Diagnosis not present

## 2017-10-07 DIAGNOSIS — I5022 Chronic systolic (congestive) heart failure: Secondary | ICD-10-CM | POA: Diagnosis not present

## 2017-10-07 DIAGNOSIS — Z95810 Presence of automatic (implantable) cardiac defibrillator: Secondary | ICD-10-CM

## 2017-10-07 MED ORDER — CARVEDILOL 3.125 MG PO TABS: 3.1250 mg | ORAL_TABLET | Freq: Two times a day (BID) | ORAL | 3 refills | Status: DC

## 2017-10-07 NOTE — Patient Instructions (Addendum)
Medication Instructions:  Your physician has recommended you make the following change in your medication:  1.  Reduce your carvedilol (Coreg).  Take 3.125 mg (one tablet) by mouth twice a day.  Labwork: None ordered.  Testing/Procedures: Your physician has requested that you have an echocardiogram. Echocardiography is a painless test that uses sound waves to create images of your heart. It provides your doctor with information about the size and shape of your heart and how well your heart's chambers and valves are working. This procedure takes approximately one hour. There are no restrictions for this procedure.  Schedule for an ECHO in March 2019.  Follow-Up: Your physician wants you to follow-up in: 9 months with Dr. Lovena Le.   You will receive a reminder letter in the mail two months in advance. If you don't receive a letter, please call our office to schedule the follow-up appointment.  Remote monitoring is used to monitor your ICD from home. This monitoring reduces the number of office visits required to check your device to one time per year. It allows [place] to keep an eye on the functioning of your device to ensure it is working properly. You are scheduled for a device check from home on 01/06/2018. You may send your transmission at any time that day. If you have a wireless device, the transmission will be sent automatically. After your physician reviews your transmission, you will receive a postcard with your next transmission date.    Any Other Special Instructions Will Be Listed Below (If Applicable).     If you need a refill on your cardiac medications before your next appointment, please call your pharmacy.

## 2017-10-07 NOTE — Progress Notes (Signed)
HPI Monica Solis returns today for ongoing evaluation and management of her ICD, s/p BiV insertion. The patient feels better after her device was placed and has more energy. At times her bp has been a little low. No chest pain.  Allergies  Allergen Reactions  . Crestor [Rosuvastatin Calcium] Other (See Comments)    Muscle aches   . Other Swelling    Metal  . Benadryl [Diphenhydramine Hcl] Hives  . Invokana [Canagliflozin] Other (See Comments)    Loss of bladder control   . Metformin Other (See Comments)    Kidney damage  . Pravastatin Other (See Comments)    Muscle ache  . Tramadol Other (See Comments)    "nightmares"  . Victoza [Liraglutide] Other (See Comments)    "Fainting"     Current Outpatient Medications  Medication Sig Dispense Refill  . calcium carbonate (TUMS - DOSED IN MG ELEMENTAL CALCIUM) 500 MG chewable tablet Chew 1 tablet by mouth daily as needed for indigestion or heartburn.    . Cholecalciferol (VITAMIN D3) 2000 UNITS capsule Take 4,000 Units by mouth daily with supper.     . diclofenac sodium (VOLTAREN) 1 % GEL Apply 2 g topically 4 (four) times daily.     . fexofenadine (ALLEGRA) 180 MG tablet Take 180 mg by mouth daily as needed for allergies or rhinitis.    [name] gabapentin (NEURONTIN) 100 MG capsule Take 200 mg by mouth at bedtime.     . hydrochlorothiazide (HYDRODIURIL) 12.5 MG tablet Take 12.5 mg by mouth daily as needed (fluid).     . insulin aspart (NOVOLOG) 100 UNIT/ML injection Inject 7-10 Units into the skin 3 (three) times daily as needed for high blood sugar. Per SSC 7-10units when cbg above 120    . insulin detemir (LEVEMIR) 100 UNIT/ML injection Inject 36 Units into the skin at bedtime.     . Iron-Vitamins (GERITOL COMPLETE) TABS Take 1 tablet by mouth daily with supper.     . levothyroxine (SYNTHROID, LEVOTHROID) 75 MCG tablet Take 75 mcg by mouth daily before breakfast.     . Menthol, Topical Analgesic, (BENGAY EX) Apply 1 application  topically 2 (two) times daily as needed (pain).    [name] omeprazole (PRILOSEC) 40 MG capsule Take 40 mg by mouth at bedtime.     . Probiotic Product (PROBIOTIC DAILY) CAPS Take 1 capsule by mouth daily with supper.     . sacubitril-valsartan (ENTRESTO) 49-51 MG Take 1 tablet by mouth 2 (two) times daily. 180 tablet 3  . Trolamine Salicylate (ASPERCREME EX) Apply 1 application topically 2 (two) times daily as needed (pain).     . carvedilol (COREG) 3.125 MG tablet Take 1 tablet (3.125 mg total) by mouth 2 (two) times daily. 180 tablet 3   No current facility-administered medications for this visit.    Facility-Administered Medications Ordered in Other Visits  Medication Dose Route Frequency Provider Last Rate Last Dose  . technetium tetrofosmin (TC-MYOVIEW) injection 24.2 millicurie  68.3 millicurie Intravenous Once PRN Hilty, Nadean Corwin, MD         Past Medical History:  Diagnosis Date  . Abnormal uterine bleeding (AUB)   . AICD (automatic cardioverter/defibrillator) present   . At risk for sleep apnea    STOP-BANG=  5       SENT TO PCP 10-01-2016  . Blood clot in vein    "superficial blood clots in BLE; I had 6-7 since 2000" (06/26/2017)  . BMI 45.0-49.9, adult ([place])   .  Chronic bronchitis ([place])   . Diverticulosis of colon   . Endometrial polyp   . Factor 5 Leiden mutation, heterozygous ([place])    dx 2011  . GERD (gastroesophageal reflux disease)   . High cholesterol   . History of DVT of lower extremity 2000; 2011   "I've had them in both"  . History of ectopic pregnancy 1998  . Hypertension   . Hypothyroidism   . Left bundle branch block (LBBB)   . Osteoarthritis    "all over" (06/26/2017)  . Peripheral neuropathy   . PONV (postoperative nausea and vomiting)   . Type 2 diabetes mellitus treated with insulin (H[facility])     ROS:   All systems reviewed and negative except as noted in the HPI.   Past Surgical History:  Procedure Laterality Date  . BI-VENTRICULAR IMPLANTABLE  CARDIOVERTER DEFIBRILLATOR  (CRT-D)  06/26/2017  . BIV ICD INSERTION CRT-D N/A 06/26/2017   Procedure: BIV ICD INSERTION CRT-D;  Surgeon: Evans Lance, MD;  Location: [facility];  Service: Cardiovascular;  Laterality: N/A;  . COLONOSCOPY  2013  . DILATATION & CURETTAGE/HYSTEROSCOPY WITH MYOSURE N/A 10/05/2016   Procedure: [place];  Surgeon: Anastasio Auerbach, MD;  Location: [facility];  Service: Gynecology;  Laterality: N/A;  requests to follow 7:30am caserequests one hour OR time  . DILATION AND CURETTAGE OF UTERUS  12/06/2007  . LAPAROSCOPY BILATERAL TUBAL LIGATION WITH FALOPE RINGS/  D&C HYSTEROSCOPY WITH POLYPECTOMY  12/06/2007   and Lysis Adhesions  . LEFT HEART CATH AND CORONARY ANGIOGRAPHY N/A 01/03/2017   Procedure: Left Heart Cath and Coronary Angiography;  Surgeon: Lorretta Harp, MD;  Location: [facility];  Service: Cardiovascular;  Laterality: N/A;  . TUBAL LIGATION  12/06/2007     Family History  Problem Relation Age of Onset  . Hypertension Mother   . Heart disease Mother   . Alzheimer's disease Mother   . Heart disease Father   . Breast cancer Maternal Aunt        30's  . Diabetes Maternal Grandmother   . Heart disease Maternal Grandmother   . Heart disease Paternal Grandmother   . Cancer Maternal Grandfather        Unknown type  . Mitral valve prolapse Sister   . Heart disease Paternal Grandfather      Social History   Socioeconomic History  . Marital status: Married    Spouse name: Not on file  . Number of children: Not on file  . Years of education: Not on file  . Highest education level: Not on file  Social Needs  . Financial resource strain: Not on file  . Food insecurity - worry: Not on file  . Food insecurity - inability: Not on file  . Transportation needs - medical: Not on file  . Transportation needs - non-medical: Not on file  Occupational History  . Not on file    Tobacco Use  . Smoking status: Former Smoker    Packs/day: 0.10    Years: 2.00    Pack years: 0.20    Types: Cigarettes    Last attempt to quit: 10/02/1979    Years since quitting: 38.0  . Smokeless tobacco: Never Used  Substance and Sexual Activity  . Alcohol use: No    Alcohol/week: 0.0 oz  . Drug use: No  . Sexual activity: Not Currently    Partners: Male    Birth control/protection: Surgical    Comment:  1st intercourse 32 yo-1 partner-BTL  Other Topics Concern  . Not on file  Social History Narrative  . Not on file     BP 110/80   Pulse 64   Ht 5\' 9"  (1.753 m)   Wt (!) 332 lb 9.6 oz (150.9 kg)   BMI 49.12 kg/m   Physical Exam:  Well appearing middle aged woman, NAD HEENT: Unremarkable Neck:  6 cm JVD, no thyromegally Lymphatics:  No adenopathy Back:  No CVA tenderness Lungs:  Clear with no wheezes HEART:  Regular rate rhythm, no murmurs, no rubs, no clicks Abd:  soft, positive bowel sounds, no organomegally, no rebound, no guarding Ext:  2 plus pulses, no edema, no cyanosis, no clubbing Skin:  No rashes no nodules Neuro:  CN II through XII intact, motor grossly intact  EKG - NSR with biv pacing  DEVICE  Normal device function.  See PaceArt for details.   Assess/Plan: 1. Chronic systolic heart failure - her symptoms are improved. We will back off on her coreg today because she has had some low bp's.  2. ICD - her [employer] device is working normally. Will recheck in several months. 3. Obesity - I discussed the importance of weight loss and she will try to lose weight.  Mikle Bosworth.D.

## 2017-10-09 ENCOUNTER — Encounter: Payer: Self-pay | Admitting: Neurology

## 2017-10-09 ENCOUNTER — Telehealth: Payer: Self-pay | Admitting: Neurology

## 2017-10-09 NOTE — Telephone Encounter (Signed)
-----   Message from Larey Seat, MD sent at 10/05/2017  1:38 PM EST ----- CPAP therapy is recommended.  IMPRESSION: Mild- Moderate obstructive sleep apnea, no central  apneas emerging.  Desaturation time was low. REM sleep accentuated apnea.  RECOMMENDATION: CPAP is recommended for REM dependent apnea and  cardiac arrhythmia or failure patients. I will order auto CPAP  5-15 cm water, heated humidity and interface of choice.

## 2017-10-09 NOTE — Telephone Encounter (Signed)
I called pt. I advised pt that Dr. Brett Fairy reviewed their sleep study results and found that pt has sleep apnea. Dr. Brett Fairy recommends that pt starts CPAP. I reviewed PAP compliance expectations with the pt. Pt is agreeable to starting a CPAP. I advised pt that an order will be sent to a DME, [facility], and [facility] will call the pt within about one week after they file with the pt's insurance. [facility] will show the pt how to use the machine, fit for masks, and troubleshoot the CPAP if needed. A follow up appt was made for insurance purposes with Cecille Rubin, NP on Jan 01, 2018 at 9:45 am. Pt verbalized understanding to arrive 15 minutes early and bring their CPAP. A letter with all of this information in it will be mailed to the pt as a reminder. I verified with the pt that the address we have on file is correct. Pt verbalized understanding of results. Pt had no questions at this time but was encouraged to call back if questions arise.

## 2017-10-10 ENCOUNTER — Encounter: Payer: Self-pay | Admitting: Gynecology

## 2017-10-11 DIAGNOSIS — Z1231 Encounter for screening mammogram for malignant neoplasm of breast: Secondary | ICD-10-CM | POA: Diagnosis not present

## 2017-10-21 ENCOUNTER — Telehealth: Payer: Self-pay | Admitting: Neurology

## 2017-10-21 NOTE — Telephone Encounter (Signed)
Received this message from [place], pt is not planning to start the CPAP at this time.   "Spoke with this patient for scheduling and she stated that she could not afford the machine at this time that it was too expensive. I offered and explained to her the used $650.00 bundle to see if that would be more realistic for her. She stated that she would give [place] a call back when she is ready to schedule an appointment for either the brand new cpap or the used bundle cpap."

## 2017-10-22 ENCOUNTER — Telehealth: Payer: Self-pay | Admitting: Neurology

## 2017-10-22 NOTE — Telephone Encounter (Signed)
Called the patient back, no answer, LVM for the pt to call back, I reached out to [facility] because I didn't quite understand why the company would deny the patient a CPAP machine. The company had spoken with the patient as stated In a telephone encounter. The machine was ran through her insurance and the patient would have to pay pretty high deductible which at the time the patient could not afford. There are used bundle packages that are available to help the patient with a cheaper package but the patient denied that through [place]. I could certainly look into sending her orders to a different DME company but the outcome will be the same because they are processing through insurance. I would recommend the patient call [facility] back and see what they can offer in their refurbished/used bundle package and see of the patient could get a better deal and payment plan because I'm not sure that other DME companies offer that.  I will discuss this with the patient when she calls back.

## 2017-10-22 NOTE — Telephone Encounter (Signed)
Informed the patient on the information that I was given. The pt states that what she was told by her insurance was because the company did not get her in before the end of the year and set up then that is why the patient has rolled over in to the new year. Pt states that she knows she met her 2018 deductible and she doesn't understand why she would have to have to pay. I have reached out to [facility] to get a clearer understanding on this issue. I will have them contact the patient and help her with understanding this process. I have informed her that the outcome would not change with another company and that it would be best to keep her with the company. She verbalized understanding.

## 2017-10-22 NOTE — Telephone Encounter (Signed)
The pt called and wants to know if she can go to another DME company. She stated that [facility] denied her for a cpap and that [employer] dropped the ball.

## 2017-10-29 DIAGNOSIS — G4733 Obstructive sleep apnea (adult) (pediatric): Secondary | ICD-10-CM | POA: Diagnosis not present

## 2017-11-08 ENCOUNTER — Telehealth: Payer: Self-pay

## 2017-11-08 NOTE — Telephone Encounter (Signed)
Referred to ICM clinic by Chanetta Marshall, NP.  Attempted ICM intro call and left message with direct phone number to return call.

## 2017-11-11 NOTE — Telephone Encounter (Signed)
Returned call to patient as requested by voice mail message.  ICM intro given and she agreed to monthly ICM calls.  She stated she is doing fine at this time. She asked if her device would beep if she gets around something like that she should not and if it will beep when the battery is low. Advised the device will beep 16 times every 6 hours for low battery and will also beep if she gets to close to a magnet.  She said when she has fluid she gets swelling in her belly and feet/legs.  Current weight is 328.2 lbs and she weighs daily.  1st ICM remote transmission scheduled for 12/03/2017, day before office visit with Dr Gwenlyn Found.  She has ICM direct number.

## 2017-11-27 DIAGNOSIS — Z1389 Encounter for screening for other disorder: Secondary | ICD-10-CM | POA: Diagnosis not present

## 2017-11-27 DIAGNOSIS — E1151 Type 2 diabetes mellitus with diabetic peripheral angiopathy without gangrene: Secondary | ICD-10-CM | POA: Diagnosis not present

## 2017-11-29 DIAGNOSIS — G4733 Obstructive sleep apnea (adult) (pediatric): Secondary | ICD-10-CM | POA: Diagnosis not present

## 2017-12-03 ENCOUNTER — Ambulatory Visit (INDEPENDENT_AMBULATORY_CARE_PROVIDER_SITE_OTHER): Payer: 59

## 2017-12-03 DIAGNOSIS — Z95810 Presence of automatic (implantable) cardiac defibrillator: Secondary | ICD-10-CM | POA: Diagnosis not present

## 2017-12-03 DIAGNOSIS — I5022 Chronic systolic (congestive) heart failure: Secondary | ICD-10-CM

## 2017-12-03 NOTE — Progress Notes (Signed)
EPIC Encounter for ICM Monitoring  Patient Name: Monica Solis is a 57 y.o. female Date: 12/03/2017 Primary Care Physican: Reynold Bowen, MD Primary Cardiologist: Gwenlyn Found Electrophysiologist: Lovena Le  Dry Weight: 330.2 lbs (weighs daily)       Heart Failure questions reviewed, pt asymptomatic for fluid symptoms.  Patient reported BP is running low causing dizziness which makes it difficult to do activities such as house cleaning or exercise.    Heart Logic Index 3 which is below threshold.       No diuretic  Recommendations: No changes.  Encouraged to call for fluid symptoms.  Follow-up plan: ICM clinic phone appointment on 01/06/2018.   Copy of ICM check sent to Dr. Lovena Le.   3 month ICM trend: 12/02/2017       [name], RN 12/03/2017 2:26 PM

## 2017-12-04 ENCOUNTER — Encounter: Payer: Self-pay | Admitting: Cardiovascular Disease

## 2017-12-04 ENCOUNTER — Ambulatory Visit: Payer: 59 | Admitting: Cardiovascular Disease

## 2017-12-04 DIAGNOSIS — I5022 Chronic systolic (congestive) heart failure: Secondary | ICD-10-CM

## 2017-12-04 DIAGNOSIS — I10 Essential (primary) hypertension: Secondary | ICD-10-CM

## 2017-12-04 DIAGNOSIS — E7800 Pure hypercholesterolemia, unspecified: Secondary | ICD-10-CM | POA: Diagnosis not present

## 2017-12-04 NOTE — Assessment & Plan Note (Signed)
History of essential hypertension on low-dose carvedilol and hydrochlorothiazide which she does not take. She says that she gets orthostatic at times and has symptomatic hypotension. I'm going to decrease her Entresto a lower dose.

## 2017-12-04 NOTE — Assessment & Plan Note (Signed)
History of nonischemic cardiomyopathy with cardiac catheterization performed 01/07/17 revealing normal coronary arteries with an EF in the 30-35% range. She did have left bundle-branch block. She underwent Bi  V ICD implantation by Dr. Crissie Sickles 06/26/17 for "CRT". She scheduled for a repeat 2-D echo next month.

## 2017-12-04 NOTE — Patient Instructions (Addendum)
Medication Instructions: Your physician recommends that you continue on your current medications as directed. Please refer to the Current Medication list given to you today.   Follow-Up: Your physician recommends that you schedule a follow-up appointment in: 3 weeks with Tommy Medal, PharmD.  We request that you follow-up in: 3 months with an extender and in 6 months with Dr Andria Rhein will receive a reminder letter in the mail two months in advance. If you don't receive a letter, please call our office to schedule the follow-up appointment.  If you need a refill on your cardiac medications before your next appointment, please call your pharmacy.

## 2017-12-04 NOTE — Assessment & Plan Note (Signed)
History of hyperlipidemia not on statin therapy followed by her PCP 

## 2017-12-04 NOTE — Progress Notes (Signed)
12/04/2017 Monica Solis   June 27, 1961  [id number]  Primary Physician Reynold Bowen, MD Primary Cardiologist: Lorretta Harp MD [name], [place]  HPI:  Monica Solis is a 57 y.o.  severely overweight married Caucasian female with no children who was referred by her general surgeon, Dr. Kieth Brightly for preoperative coronary vessel clearance prior to elective Roux-en-Y bariatric surgery. I last saw her in the office 06/07/17. Her risk factors for heart disease include treated diabetes, and hypertension. She does have a strong family history of heart disease with a father who died at age 39 of a myocardial infarction and brother at age 23. She has never had a heart attack or stroke. She denies chest pain or shortness of breath. She does have factor V Leiden deficiency has had DVT and pulmonary emboli in the past (2011). I performed a pharmacologic Myoview stress test that showed no evidence of ischemia but gated SPECT showed an ejection fraction of 35%. This was confirmed by 2-D echocardiography. She does now admit to episodes of chest burning which she has thought was reflux in the past. She underwent outpatient coronary angiography. The right radial approach by myself 01/03/17 revealing normal coronary arteries with an EF in the 30-35% range. She does have symptoms of congestive heart failure as well, class II to class III, and left bundle-branch block. Since I saw her back in 3 months ago we have optimized her heart failure pharmacotherapy for her EF by 2-D echo performed 05/17/17 remained depressed at 30-35% I referred her to Dr. Crissie Sickles for consideration of IV ICD implantation for CRT which he did successfully on 06/26/17. She is scheduled for repeat 2-D echo next month. She does complain of symptoms of hypotension and my plan is to decrease her Entresto [place] from where it is currently to a lower dose.    Current Meds  Medication Sig  . calcium carbonate (TUMS - DOSED IN MG ELEMENTAL  CALCIUM) 500 MG chewable tablet Chew 1 tablet by mouth daily as needed for indigestion or heartburn.  . carvedilol (COREG) 3.125 MG tablet Take 1 tablet (3.125 mg total) by mouth 2 (two) times daily.  . Cholecalciferol (VITAMIN D3) 2000 UNITS capsule Take 4,000 Units by mouth daily with supper.   . diclofenac sodium (VOLTAREN) 1 % GEL Apply 2 g topically 4 (four) times daily.   . fexofenadine (ALLEGRA) 180 MG tablet Take 180 mg by mouth daily as needed for allergies or rhinitis.  [name] gabapentin (NEURONTIN) 100 MG capsule Take 200 mg by mouth at bedtime.   . hydrochlorothiazide (HYDRODIURIL) 12.5 MG tablet Take 12.5 mg by mouth daily as needed (fluid).   . insulin aspart (NOVOLOG) 100 UNIT/ML injection Inject 7-10 Units into the skin 3 (three) times daily as needed for high blood sugar. Per SSC 7-10units when cbg above 120  . insulin detemir (LEVEMIR) 100 UNIT/ML injection Inject 34 Units into the skin at bedtime.   . Iron-Vitamins (GERITOL COMPLETE) TABS Take 1 tablet by mouth daily with supper.   . levothyroxine (SYNTHROID, LEVOTHROID) 75 MCG tablet Take 75 mcg by mouth daily before breakfast.   . Menthol, Topical Analgesic, (BENGAY EX) Apply 1 application topically 2 (two) times daily as needed (pain).  [name] omeprazole (PRILOSEC) 40 MG capsule Take 40 mg by mouth at bedtime.   . Probiotic Product (PROBIOTIC DAILY) CAPS Take 1 capsule by mouth daily with supper.   . sacubitril-valsartan (ENTRESTO) 49-51 MG Take 1 tablet by mouth 2 (two) times daily.  . [  DISCONTINUED] Trolamine Salicylate (ASPERCREME EX) Apply 1 application topically 2 (two) times daily as needed (pain).      Allergies  Allergen Reactions  . Crestor [Rosuvastatin Calcium] Other (See Comments)    Muscle aches   . Other Swelling    Metal  . Benadryl [Diphenhydramine Hcl] Hives  . Invokana [Canagliflozin] Other (See Comments)    Loss of bladder control   . Metformin Other (See Comments)    Kidney damage  . Pravastatin Other  (See Comments)    Muscle ache  . Tramadol Other (See Comments)    "nightmares"  . Victoza [Liraglutide] Other (See Comments)    "Fainting"    Social History   Socioeconomic History  . Marital status: Married    Spouse name: Not on file  . Number of children: Not on file  . Years of education: Not on file  . Highest education level: Not on file  Social Needs  . Financial resource strain: Not on file  . Food insecurity - worry: Not on file  . Food insecurity - inability: Not on file  . Transportation needs - medical: Not on file  . Transportation needs - non-medical: Not on file  Occupational History  . Not on file  Tobacco Use  . Smoking status: Former Smoker    Packs/day: 0.10    Years: 2.00    Pack years: 0.20    Types: Cigarettes    Last attempt to quit: 10/02/1979    Years since quitting: 38.2  . Smokeless tobacco: Never Used  Substance and Sexual Activity  . Alcohol use: No    Alcohol/week: 0.0 oz  . Drug use: No  . Sexual activity: Not Currently    Partners: Male    Birth control/protection: Surgical    Comment: 1st intercourse 32 yo-1 partner-BTL  Other Topics Concern  . Not on file  Social History Narrative  . Not on file     Review of Systems: General: negative for chills, fever, night sweats or weight changes.  Cardiovascular: negative for chest pain, dyspnea on exertion, edema, orthopnea, palpitations, paroxysmal nocturnal dyspnea or shortness of breath Dermatological: negative for rash Respiratory: negative for cough or wheezing Urologic: negative for hematuria Abdominal: negative for nausea, vomiting, diarrhea, bright red blood per rectum, melena, or hematemesis Neurologic: negative for visual changes, syncope, or dizziness All other systems reviewed and are otherwise negative except as noted above.    Blood pressure 115/70, pulse 69, height 5\' 9"  (1.753 m), weight (!) 334 lb 3.2 oz (151.6 kg).  General appearance: alert and no distress Neck:  no adenopathy, no carotid bruit, no JVD, supple, symmetrical, trachea midline and thyroid not enlarged, symmetric, no tenderness/mass/nodules Lungs: clear to auscultation bilaterally Heart: regular rate and rhythm, S1, S2 normal, no murmur, click, rub or gallop Extremities: extremities normal, atraumatic, no cyanosis or edema Pulses: 2+ and symmetric Skin: Skin color, texture, turgor normal. No rashes or lesions Neurologic: Alert and oriented X 3, normal strength and tone. Normal symmetric reflexes. Normal coordination and gait  EKG not performed today  ASSESSMENT AND PLAN:   Essential hypertension History of essential hypertension on low-dose carvedilol and hydrochlorothiazide which she does not take. She says that she gets orthostatic at times and has symptomatic hypotension. I'm going to decrease her Entresto a lower dose.  Hyperlipidemia History of hyperlipidemia not on statin therapy followed by her PCP  Chronic systolic heart failure ([place]) History of nonischemic cardiomyopathy with cardiac catheterization performed 01/07/17 revealing normal coronary arteries with  an EF in the 30-35% range. She did have left bundle-branch block. She underwent Bi  V ICD implantation by Dr. Crissie Sickles 06/26/17 for "CRT". She scheduled for a repeat 2-D echo next month.      Lorretta Harp MD FACP,FACC,FAHA, [facility] 12/04/2017 11:38 AM

## 2017-12-16 ENCOUNTER — Other Ambulatory Visit: Payer: Self-pay

## 2017-12-16 ENCOUNTER — Ambulatory Visit (HOSPITAL_COMMUNITY): Payer: 59 | Attending: Cardiology

## 2017-12-16 DIAGNOSIS — E119 Type 2 diabetes mellitus without complications: Secondary | ICD-10-CM | POA: Diagnosis not present

## 2017-12-16 DIAGNOSIS — I447 Left bundle-branch block, unspecified: Secondary | ICD-10-CM | POA: Diagnosis not present

## 2017-12-16 DIAGNOSIS — Z86718 Personal history of other venous thrombosis and embolism: Secondary | ICD-10-CM | POA: Diagnosis not present

## 2017-12-16 DIAGNOSIS — I77810 Thoracic aortic ectasia: Secondary | ICD-10-CM | POA: Diagnosis not present

## 2017-12-16 DIAGNOSIS — Z87891 Personal history of nicotine dependence: Secondary | ICD-10-CM | POA: Insufficient documentation

## 2017-12-16 DIAGNOSIS — E785 Hyperlipidemia, unspecified: Secondary | ICD-10-CM | POA: Diagnosis not present

## 2017-12-16 DIAGNOSIS — Z95810 Presence of automatic (implantable) cardiac defibrillator: Secondary | ICD-10-CM

## 2017-12-16 DIAGNOSIS — I110 Hypertensive heart disease with heart failure: Secondary | ICD-10-CM | POA: Insufficient documentation

## 2017-12-16 DIAGNOSIS — I5022 Chronic systolic (congestive) heart failure: Secondary | ICD-10-CM | POA: Insufficient documentation

## 2017-12-19 ENCOUNTER — Telehealth: Payer: Self-pay

## 2017-12-19 NOTE — Telephone Encounter (Signed)
Pt is aware and agreeable to normal results  

## 2017-12-19 NOTE — Telephone Encounter (Signed)
-----   Message from Evans Lance, MD sent at 12/18/2017 10:00 PM EST ----- Overall pumping function is normal.

## 2017-12-24 ENCOUNTER — Telehealth: Payer: Self-pay | Admitting: Cardiovascular Disease

## 2017-12-24 NOTE — Telephone Encounter (Signed)
New Message   1) Are you calling to confirm a diagnosis or obtain personal health information (Y/N)? Yes   2) If so, what information is requested? Dr. Gita Kudo with [facility] is calling to obtain patients ejection fraction. Please call to discuss. It can also be faxed to [phone] please but attention to Dr. Gita Kudo. Also a detailed message can be left on the secured line.  Please route to Medical Records or your medical records site representative

## 2017-12-26 ENCOUNTER — Ambulatory Visit (INDEPENDENT_AMBULATORY_CARE_PROVIDER_SITE_OTHER): Payer: 59 | Admitting: Pharmacist Clinician (PhC)/ Clinical Pharmacy Specialist

## 2017-12-26 DIAGNOSIS — I5022 Chronic systolic (congestive) heart failure: Secondary | ICD-10-CM

## 2017-12-26 MED ORDER — SACUBITRIL-VALSARTAN 24-26 MG PO TABS: 1.0000 | ORAL_TABLET | Freq: Two times a day (BID) | ORAL | 3 refills | Status: DC

## 2017-12-26 NOTE — Progress Notes (Signed)
-*    12/26/2017 Monica Solis August 19, 1961 [id number]   HPI:  Monica Solis is a 57 y.o. female patient of Dr Monica Solis, with a PMH below who presents today for heart failure medication titration.   Patient was Solis to have HF after being seen for cardiac clearance prior to bariatric surgery.  Her stress test showed EF of 35% and she was Solis to have symptoms of CHF as well as left bundle branch block.  She was started on Entretso 49/51 and carvedilol 12.5 and had ICD implantation.  Since that hospital discharge her carvedilol dose has been progressively weaned back to 3.125 mg due to hypotension.  Her Delene Loll was also cut back to the lowest dose of 24/26 mg.   In addition to HF, her medical history is significant for hypertension, hyperlipidemia, obesity, prior DVT (with Factor V Leiden), hypothyroidism, and diabetes.  Today she reports feeling more fatigue and increased heart rate since decreasing her Entresto, but otherwise feels fine.  Of note an ECHO done earlier this month showed an improved LVEF, up to 55-60%  Blood Pressure Goal:  130/80  Current Medications:  Carvedilol 3.125 mg bid  Entresto 24/26 mg bid  Hydrochlorothiazide 12.5 mg prn fluid retention  Family Hx:  Father died from MI at 20, brother at 41  Social Hx:  No tobacco, quit many years ago; occasional alcohol  Diet:  Eats mainly home cooked, no added salt  Exercise:  Walking her dog 15-20 minutes, 2-3 times per day    Wt Readings from Last 3 Encounters:  12/04/17 (!) 334 lb 3.2 oz (151.6 kg)  10/07/17 (!) 332 lb 9.6 oz (150.9 kg)  09/16/17 (!) 328 lb (148.8 kg)   BP Readings from Last 3 Encounters:  12/26/17 122/74  12/04/17 115/70  10/07/17 110/80   Pulse Readings from Last 3 Encounters:  12/26/17 68  12/04/17 69  10/07/17 64    Current Outpatient Medications  Medication Sig Dispense Refill  . calcium carbonate (TUMS - DOSED IN MG ELEMENTAL CALCIUM) 500 MG chewable tablet Chew 1 tablet by mouth  daily as needed for indigestion or heartburn.    . carvedilol (COREG) 3.125 MG tablet Take 1 tablet (3.125 mg total) by mouth 2 (two) times daily. 180 tablet 3  . Cholecalciferol (VITAMIN D3) 2000 UNITS capsule Take 4,000 Units by mouth daily with supper.     . diclofenac sodium (VOLTAREN) 1 % GEL Apply 2 g topically 4 (four) times daily.     . fexofenadine (ALLEGRA) 180 MG tablet Take 180 mg by mouth daily as needed for allergies or rhinitis.    [name] gabapentin (NEURONTIN) 100 MG capsule Take 200 mg by mouth at bedtime.     . hydrochlorothiazide (HYDRODIURIL) 12.5 MG tablet Take 12.5 mg by mouth daily as needed (fluid).     . insulin aspart (NOVOLOG) 100 UNIT/ML injection Inject 7-10 Units into the skin 3 (three) times daily as needed for high blood sugar. Per SSC 7-10units when cbg above 120    . insulin detemir (LEVEMIR) 100 UNIT/ML injection Inject 34 Units into the skin at bedtime.     . Iron-Vitamins (GERITOL COMPLETE) TABS Take 1 tablet by mouth daily with supper.     . levothyroxine (SYNTHROID, LEVOTHROID) 75 MCG tablet Take 75 mcg by mouth daily before breakfast.     . Menthol, Topical Analgesic, (BENGAY EX) Apply 1 application topically 2 (two) times daily as needed (pain).    [name] omeprazole (PRILOSEC) 40 MG capsule Take 40  mg by mouth at bedtime.     . Probiotic Product (PROBIOTIC DAILY) CAPS Take 1 capsule by mouth daily with supper.     . sacubitril-valsartan (ENTRESTO) 49-51 MG Take 1 tablet by mouth 2 (two) times daily. 180 tablet 3   No current facility-administered medications for this visit.    Facility-Administered Medications Ordered in Other Visits  Medication Dose Route Frequency Provider Last Rate Last Dose  . technetium tetrofosmin (TC-MYOVIEW) injection 58.0 millicurie  99.8 millicurie Intravenous Once PRN Hilty, Nadean Corwin, MD        Allergies  Allergen Reactions  . Crestor [Rosuvastatin Calcium] Other (See Comments)    Muscle aches   . Other Swelling    Metal  .  Benadryl [Diphenhydramine Hcl] Hives  . Invokana [Canagliflozin] Other (See Comments)    Loss of bladder control   . Metformin Other (See Comments)    Kidney damage  . Pravastatin Other (See Comments)    Muscle ache  . Tramadol Other (See Comments)    "nightmares"  . Victoza [Liraglutide] Other (See Comments)    "Fainting"    Past Medical History:  Diagnosis Date  . Abnormal uterine bleeding (AUB)   . AICD (automatic cardioverter/defibrillator) present   . At risk for sleep apnea    STOP-BANG=  5       SENT TO PCP 10-01-2016  . Blood clot in vein    "superficial blood clots in BLE; I had 6-7 since 2000" (06/26/2017)  . BMI 45.0-49.9, adult ([place])   . Chronic bronchitis ([place])   . Diverticulosis of colon   . Endometrial polyp   . Factor 5 Leiden mutation, heterozygous ([place])    dx 2011  . GERD (gastroesophageal reflux disease)   . High cholesterol   . History of DVT of lower extremity 2000; 2011   "I've had them in both"  . History of ectopic pregnancy 1998  . Hypertension   . Hypothyroidism   . Left bundle branch block (LBBB)   . Osteoarthritis    "all over" (06/26/2017)  . Peripheral neuropathy   . PONV (postoperative nausea and vomiting)   . Type 2 diabetes mellitus treated with insulin (HCC)     Blood pressure 122/74, pulse 68.  Chronic systolic (congestive) heart failure (HCC) Patient with CHF, EF now improved to 55-60 % on last echo (earlier this month).  She feels as though she is doing well with the lower dose of Entresto, has not had any episodes of dizziness or lightheadedness recently.  She will continue with her current medications, and she knows to call should she have any concerns.    Tommy Medal PharmD CPP [facility] [address] [place], [place] [address] [phone]

## 2017-12-26 NOTE — Patient Instructions (Signed)
Continue with the Entresto 24/26 mg twice daily.  If you feel better after taking the samples then continue with that dose (rather than half of the 49/51 mg tablet)  Any questions or concerns call us at [phone]  (Stepfanie Yott/Raquel).

## 2017-12-26 NOTE — Assessment & Plan Note (Signed)
Patient with CHF, EF now improved to 55-60 % on last echo (earlier this month).  She feels as though she is doing well with the lower dose of Entresto, has not had any episodes of dizziness or lightheadedness recently.  She will continue with her current medications, and she knows to call should she have any concerns.

## 2017-12-27 ENCOUNTER — Encounter: Payer: Self-pay | Admitting: Pharmacist Clinician (PhC)/ Clinical Pharmacy Specialist

## 2017-12-27 DIAGNOSIS — G4733 Obstructive sleep apnea (adult) (pediatric): Secondary | ICD-10-CM | POA: Diagnosis not present

## 2017-12-30 NOTE — Progress Notes (Signed)
[facility]  PATIENT: Monica Solis DOB: 08/18/1961   REASON FOR VISIT: Follow-up for newly diagnosed obstructive sleep apnea with initial CPAP compliance HISTORY FROM: Patient    HISTORY OF PRESENT ILLNESS:UPDATE Monica Solis, 57 year old female returns for follow-up with newly diagnosed obstructive sleep apnea here for initial CPAP compliance.  Patient was having problems with her initial mask and this was changed on Monday of this week.  She is doing better with this however she still feels like she has an air leak and that the pressure is too high.  Compliance data dated 12/02/2017-12/31/2017 shows greater than 4 hours at 97%.  Average usage 6 hours 38 set pressure 5-15 cm.  EPR level 3.  AHI 0.2 leaks 95th percentile at 1.2 ESS score 5.  She returns for reevaluation 09/09/17 CDCynthia Solis is a 57 y.o. female , seen here as in a referral  from Dr. Forde Dandy for a sleep evaluation- she reports feeling exhausted and strained by minor activity. Her STOP Bang score was 5 - urging an evaluation.  Monica Solis is a 57 year old, right-handed Caucasian female patient, who presents with a significant diagnosis of nonischemic cardiac myopathy.  She has a history of high blood pressure, high cholesterol, diabetes, GERD, lower extremity dependent edema, left bundle branch block and factor V Leiden hypothyroidism diverticulosis uterine fibroids, she had a cardiac defibrillator implanted on June 26, 2017. She reports getting a little bit more strength back now. Her next cardiology appointment is on Dec 14th 2018, Dr Crissie Sickles, electrophysiology.  After the implantation in hospital she was recorded having hypoxemia and irregular breathing.  " In 2011 I was diagnosed with EF 55% and LBBB.  I had pain in my chest throughout  2017 and early 2018- and was told it was  GERD. I was asking for a referral to bariatric surgery and there I was diagnosed with CHF class 3- and changed doctors. The heart  failure was just diagnosed earlier this year".  Monica Solis also stated that her mother suffers from a cardiomyopathy. She follows Quay Burow, MD for cardiology .   Sleep habits are as follows:  The patient usually watches TV in the den before retreating to her bedroom between 11 and 11:30 PM.  She may still read for a while, and if she cannot fall asleep within 30-60 minutes she will get back up.  Lately, she has had frequently difficulties initiating sleep within an hour.  She would go back to the den.  She may play on her iPad or watch TV again until she feels sleepy enough to try.  Between 2 and 3 AM she is usually asleep.  She was troubled by nocturia before the defibrillator was implanted, now she only rises once at night.  She usually can sleep through until 5 AM when her husband rises. She may get another 1-2 hours of sleep. In her working years she rose at 5 AM.   She always wakes with a very dry mouth, and headaches  ( dull and throbbing) and frequently feeling clammy, with night sweats, palpitations.    REVIEW OF SYSTEMS: Full 14 system review of systems performed and notable only for those listed, all others are neg:  Constitutional: neg  Cardiovascular: neg Ear/Nose/Throat: neg  Skin: neg Eyes: neg Respiratory: neg Gastroitestinal: neg  Hematology/Lymphatic: neg  Endocrine: neg Musculoskeletal:neg Allergy/Immunology: neg Neurological: neg Psychiatric: neg Sleep : Obstructive sleep apnea with CPAP   ALLERGIES: Allergies  Allergen Reactions  . Crestor [Rosuvastatin Calcium] Other (  See Comments)    Muscle aches   . Other Swelling    Metal  . Benadryl [Diphenhydramine Hcl] Hives  . Invokana [Canagliflozin] Other (See Comments)    Loss of bladder control   . Metformin Other (See Comments)    Kidney damage  . Pravastatin Other (See Comments)    Muscle ache  . Tramadol Other (See Comments)    "nightmares"  . Victoza [Liraglutide] Other (See Comments)     "Fainting"    HOME MEDICATIONS: Outpatient Medications Prior to Visit  Medication Sig Dispense Refill  . calcium carbonate (TUMS - DOSED IN MG ELEMENTAL CALCIUM) 500 MG chewable tablet Chew 1 tablet by mouth daily as needed for indigestion or heartburn.    . carvedilol (COREG) 3.125 MG tablet Take 1 tablet (3.125 mg total) by mouth 2 (two) times daily. 180 tablet 3  . Cholecalciferol (VITAMIN D3) 2000 UNITS capsule Take 4,000 Units by mouth daily with supper.     . diclofenac sodium (VOLTAREN) 1 % GEL Apply 2 g topically 4 (four) times daily.     . fexofenadine (ALLEGRA) 180 MG tablet Take 180 mg by mouth daily as needed for allergies or rhinitis.    [name] gabapentin (NEURONTIN) 100 MG capsule Take 200 mg by mouth at bedtime.     . hydrochlorothiazide (HYDRODIURIL) 12.5 MG tablet Take 12.5 mg by mouth daily as needed (fluid).     . insulin aspart (NOVOLOG) 100 UNIT/ML injection Inject 7-10 Units into the skin 3 (three) times daily as needed for high blood sugar. Per SSC 7-10units when cbg above 120    . insulin detemir (LEVEMIR) 100 UNIT/ML injection Inject 34 Units into the skin at bedtime.     . Iron-Vitamins (GERITOL COMPLETE) TABS Take 1 tablet by mouth daily with supper.     . levothyroxine (SYNTHROID, LEVOTHROID) 75 MCG tablet Take 75 mcg by mouth daily before breakfast.     . Menthol, Topical Analgesic, (BENGAY EX) Apply 1 application topically 2 (two) times daily as needed (pain).    [name] omeprazole (PRILOSEC) 40 MG capsule Take 40 mg by mouth at bedtime.     . Probiotic Product (PROBIOTIC DAILY) CAPS Take 1 capsule by mouth daily with supper.     . sacubitril-valsartan (ENTRESTO) 24-26 MG Take 1 tablet by mouth 2 (two) times daily. 180 tablet 3   Facility-Administered Medications Prior to Visit  Medication Dose Route Frequency Provider Last Rate Last Dose  . technetium tetrofosmin (TC-MYOVIEW) injection 16.1 millicurie  09.6 millicurie Intravenous Once PRN Hilty, Nadean Corwin, MD         PAST MEDICAL HISTORY: Past Medical History:  Diagnosis Date  . Abnormal uterine bleeding (AUB)   . AICD (automatic cardioverter/defibrillator) present   . At risk for sleep apnea    STOP-BANG=  5       SENT TO PCP 10-01-2016  . Blood clot in vein    "superficial blood clots in BLE; I had 6-7 since 2000" (06/26/2017)  . BMI 45.0-49.9, adult ([place])   . Chronic bronchitis ([place])   . Diverticulosis of colon   . Endometrial polyp   . Factor 5 Leiden mutation, heterozygous ([place])    dx 2011  . GERD (gastroesophageal reflux disease)   . High cholesterol   . History of DVT of lower extremity 2000; 2011   "I've had them in both"  . History of ectopic pregnancy 1998  . Hypertension   . Hypothyroidism   . Left bundle branch block (LBBB)   .  Osteoarthritis    "all over" (06/26/2017)  . Peripheral neuropathy   . PONV (postoperative nausea and vomiting)   . Type 2 diabetes mellitus treated with insulin ([place])     PAST SURGICAL HISTORY: Past Surgical History:  Procedure Laterality Date  . BI-VENTRICULAR IMPLANTABLE CARDIOVERTER DEFIBRILLATOR  (CRT-D)  06/26/2017  . BIV ICD INSERTION CRT-D N/A 06/26/2017   Procedure: BIV ICD INSERTION CRT-D;  Surgeon: Evans Lance, MD;  Location: [facility];  Service: Cardiovascular;  Laterality: N/A;  . COLONOSCOPY  2013  . DILATATION & CURETTAGE/HYSTEROSCOPY WITH MYOSURE N/A 10/05/2016   Procedure: [place];  Surgeon: Anastasio Auerbach, MD;  Location: [place];  Service: Gynecology;  Laterality: N/A;  requests to follow 7:30am caserequests one hour OR time  . DILATION AND CURETTAGE OF UTERUS  12/06/2007  . LAPAROSCOPY BILATERAL TUBAL LIGATION WITH FALOPE RINGS/  D&C HYSTEROSCOPY WITH POLYPECTOMY  12/06/2007   and Lysis Adhesions  . LEFT HEART CATH AND CORONARY ANGIOGRAPHY N/A 01/03/2017   Procedure: Left Heart Cath and Coronary Angiography;  Surgeon: Lorretta Harp, MD;  Location: [facility];  Service: Cardiovascular;  Laterality: N/A;  . TUBAL LIGATION  12/06/2007    FAMILY HISTORY: Family History  Problem Relation Age of Onset  . Hypertension Mother   . Heart disease Mother   . Alzheimer's disease Mother   . Heart disease Father   . Breast cancer Maternal Aunt        30's  . Alzheimer's disease Maternal Aunt   . Diabetes Maternal Grandmother   . Heart disease Maternal Grandmother   . Heart disease Paternal Grandmother   . Cancer Maternal Grandfather        Unknown type  . Mitral valve prolapse Sister   . Heart disease Paternal Grandfather     SOCIAL HISTORY: Social History   Socioeconomic History  . Marital status: Married    Spouse name: Not on file  . Number of children: Not on file  . Years of education: Not on file  . Highest education level: Not on file  Social Needs  . Financial resource strain: Not on file  . Food insecurity - worry: Not on file  . Food insecurity - inability: Not on file  . Transportation needs - medical: Not on file  . Transportation needs - non-medical: Not on file  Occupational History  . Not on file  Tobacco Use  . Smoking status: Former Smoker    Packs/day: 0.10    Years: 2.00    Pack years: 0.20    Types: Cigarettes    Last attempt to quit: 10/02/1979    Years since quitting: 38.2  . Smokeless tobacco: Never Used  Substance and Sexual Activity  . Alcohol use: No    Alcohol/week: 0.0 oz  . Drug use: No  . Sexual activity: Not Currently    Partners: Male    Birth control/protection: Surgical    Comment: 1st intercourse 32 yo-1 partner-BTL  Other Topics Concern  . Not on file  Social History Narrative  . Not on file     PHYSICAL EXAM  Vitals:   01/01/18 0925  BP: 127/82  Pulse: 75  Weight: (!) 339 lb 12.8 oz (154.1 kg)   Body mass index is 50.18 kg/m.  Generalized: Well developed, morbidly obese female in no acute distress  Head: normocephalic and atraumatic,. Oropharynx benign  [place] 2 Neck: Supple, neck circumference 17 Musculoskeletal: No deformity  Neurological examination   Mentation: Alert oriented to time, place, history taking. Attention span and concentration appropriate. Recent and remote memory intact.  Follows all commands speech and language fluent.   Cranial nerve II-XII: .Pupils were equal round reactive to light extraocular movements were full, visual field were full on confrontational test. Facial sensation and strength were normal. hearing was intact to finger rubbing bilaterally. Uvula tongue midline. head turning and shoulder shrug were normal and symmetric.Tongue protrusion into cheek strength was normal. Motor: normal bulk and tone, full strength in the BUE, BLE,   Coordination: finger-nose-finger, heel-to-shin bilaterally, no dysmetria Gait and Station: Rising up from seated position without assistance, wide based  stance,   DIAGNOSTIC DATA (LABS, IMAGING, TESTING) - I reviewed patient records, labs, notes, testing and imaging myself where available.  Lab Results  Component Value Date   WBC 7.3 06/21/2017   HGB 15.3 06/21/2017   HCT 45.2 06/21/2017   MCV 89 06/21/2017   PLT 261 06/21/2017      Component Value Date/Time   NA 140 06/21/2017 1214   K 4.7 06/21/2017 1214   CL 101 06/21/2017 1214   CO2 25 06/21/2017 1214   GLUCOSE 103 (H) 06/21/2017 1214   GLUCOSE 177 (H) 12/27/2016 0919   BUN 16 06/21/2017 1214   CREATININE 0.70 06/21/2017 1214   CREATININE 0.70 12/27/2016 0919   CALCIUM 9.8 06/21/2017 1214   PROT 7.5 10/05/2016 0709   ALBUMIN 4.2 10/05/2016 0709   AST 30 10/05/2016 0709   ALT 39 10/05/2016 0709   ALKPHOS 67 10/05/2016 0709   BILITOT 1.4 (H) 10/05/2016 0709   GFRNONAA 98 06/21/2017 1214   GFRNONAA >89 12/27/2016 0919   GFRAA 113 06/21/2017 1214   GFRAA >89 12/27/2016 0919    Lab Results  Component Value Date   TSH 5.37 (H) 12/27/2016      ASSESSMENT AND PLAN  57 y.o. year old female  has a  past medical history of , AICD (automatic cardioverter/defibrillator) present,   BMI 45.0-49.9, adult ([place]),  Factor 5 Leiden mutation, heterozygous ([place]),  High cholesterol, History of DVT of lower extremity (2000; 2011),  Hypertension, Hypothyroidism, Left bundle branch block (LBBB), Osteoarthritis, Peripheral neuropathy, Type 2 diabetes mellitus treated with insulin ([place]).  And newly diagnosed obstructive sleep apnea here for initial CPAP compliance.  Sleep study shows mild to moderate sleep apnea no central apneas emerging REM sleep accentuated apnea.Data dated 12/02/2017-12/31/2017 shows greater than 4 hours at 97%.  Average usage 6 hours 38 set pressure 5-15 cm.  EPR level 3.  AHI 0.2 leaks 95th percentile at 1.2 ESS score 5.   CPAP compliance 97% Will decrease max pressure to 12 Follow-up in 3 months for repeat CPAP compliance Dennie Bible, [facility], [facility], APRN  [facility] [address] [place], [place] [address] [phone]

## 2017-12-31 ENCOUNTER — Encounter: Payer: Self-pay | Admitting: Nurse Practitioner

## 2018-01-01 ENCOUNTER — Ambulatory Visit (INDEPENDENT_AMBULATORY_CARE_PROVIDER_SITE_OTHER): Payer: 59 | Admitting: Nurse Practitioner

## 2018-01-01 ENCOUNTER — Encounter: Payer: Self-pay | Admitting: Nurse Practitioner

## 2018-01-01 DIAGNOSIS — G4733 Obstructive sleep apnea (adult) (pediatric): Secondary | ICD-10-CM | POA: Diagnosis not present

## 2018-01-01 DIAGNOSIS — Z9989 Dependence on other enabling machines and devices: Secondary | ICD-10-CM | POA: Diagnosis not present

## 2018-01-01 NOTE — Progress Notes (Signed)
CPAP DME order successfully faxed to [facility], re: decrease maximum pressure to 12.

## 2018-01-01 NOTE — Patient Instructions (Signed)
CPAP compliance 97% Will decrease max pressure to 12 Follow-up in 3 months for repeat CPAP compliance

## 2018-01-06 ENCOUNTER — Ambulatory Visit (INDEPENDENT_AMBULATORY_CARE_PROVIDER_SITE_OTHER): Payer: 59 | Admitting: *Deleted

## 2018-01-06 DIAGNOSIS — Z95810 Presence of automatic (implantable) cardiac defibrillator: Secondary | ICD-10-CM

## 2018-01-06 DIAGNOSIS — I5022 Chronic systolic (congestive) heart failure: Secondary | ICD-10-CM | POA: Diagnosis not present

## 2018-01-06 DIAGNOSIS — I428 Other cardiomyopathies: Secondary | ICD-10-CM | POA: Diagnosis not present

## 2018-01-06 NOTE — Progress Notes (Signed)
Remote ICD transmission.   

## 2018-01-07 ENCOUNTER — Encounter: Payer: Self-pay | Admitting: Cardiology

## 2018-01-07 LAB — CUP PACEART REMOTE DEVICE CHECK
Date Time Interrogation Session: 20190326105901
Implantable Lead Implant Date: 20180912
Implantable Lead Implant Date: 20180912
Implantable Lead Location: 753858
Implantable Lead Model: 4674
Implantable Lead Model: 7741
Implantable Lead Serial Number: 813037
Implantable Lead Serial Number: 920623
Implantable Pulse Generator Implant Date: 20180912
MDC IDC LEAD IMPLANT DT: 20180912
MDC IDC LEAD LOCATION: 753859
MDC IDC LEAD LOCATION: 753860
MDC IDC LEAD SERIAL: 435131
MDC IDC PG SERIAL: 193993

## 2018-01-07 NOTE — Progress Notes (Signed)
EPIC Encounter for ICM Monitoring  Patient Name: Monica Solis is a 57 y.o. female Date: 01/07/2018 Primary Care Physican: Reynold Bowen, MD Primary Cardiologist: Gwenlyn Found Electrophysiologist: Lovena Le  Dry Weight:  Previous weight  330.2 lbs (weighs daily)        Attempted call to patient and unable to reach.  Left detailed message regarding transmission.  Transmission reviewed.    01/05/2018 [facility] HF Index: 6    No diuretic  Recommendations: Left voice mail with ICM number and encouraged to call if experiencing any fluid symptoms.  Follow-up plan: ICM clinic phone appointment on 02/10/2018.          Copy of ICM check sent to Dr. Lovena Le.  3 Month Trend                [name], RN 01/07/2018 3:08 PM

## 2018-01-08 ENCOUNTER — Telehealth: Payer: Self-pay | Admitting: Neurology

## 2018-01-08 DIAGNOSIS — G4733 Obstructive sleep apnea (adult) (pediatric): Secondary | ICD-10-CM

## 2018-01-08 DIAGNOSIS — Z9989 Dependence on other enabling machines and devices: Secondary | ICD-10-CM

## 2018-01-08 NOTE — Telephone Encounter (Signed)
Called the patient and made her aware that I was able to pull the download and look it over. It appears the reason that we only decreased the maximum pressure for the patient to 12 was because her average pressure she is using is 11.5 cm and we cant lower the machine to 11 if she is needing more. The patient then went on to state that the pressure blows her mask off and the pressure feels to much. According to her data though there is no air leaks noted. The patient has already had a mask change with [facility] and looked at by them. I recommended that she come in and have a meeting with one of our sleep techs to have them assess what she is seeing with her machine and see if they can help and maybe explain things better for the pt. She had questions about her sleep test that werent answered. I have informed her that I will call her in the morning and see what day would work best to bring her in the sleep lab to see how we can best help her. Pt was appreciative and verbalized understanding.

## 2018-01-08 NOTE — Telephone Encounter (Signed)
Pt called she is requesting to see Dr Brett Fairy only. She said at [facility] on 3/20 she did not feel she got the results she was looking for.  Pt said her mask is leaking, she asked to have pressure turned down to 11 but was advised it could be turned down to 12. She said she has to reset the CPAP multiple times during the night bc when the pressure gets high the air starts to leak and blows the mask. Otherwise she said she is doing ok. Please call to advise about the pressure.

## 2018-01-09 NOTE — Telephone Encounter (Signed)
Spoke with patient and she explained that she feels her mask leaks after about two hours of sleep. Download shows very little leak. She has a Furniture conservator/restorer. I ask if she felt it was a lot of pressure more than a leak. She thought that oculd be it. Her AHI is 0.2 on Auto of 5-12. Can we reduce pressure to 5- 10 and re check download in couple of weeks. I believe she will do better with this. She is going to call me if this does not help and I will bring her into the sleep lab to look at machine and do a mask fitting.

## 2018-01-09 NOTE — Telephone Encounter (Signed)
Order has been sent to [facility] for the pt.

## 2018-01-17 ENCOUNTER — Telehealth: Payer: Self-pay | Admitting: *Deleted

## 2018-01-17 NOTE — Telephone Encounter (Signed)
Pt called and left message in triage voicemail stating she has not had cycle in over 1 year and bleeding started last night asked what to do. I called pt back and she said still bleeding today, not heavy, medium flow. I advised pt to schedule OV with provider, transferred to front desk to schedule.

## 2018-01-20 ENCOUNTER — Encounter: Payer: Self-pay | Admitting: Gynecology

## 2018-01-20 ENCOUNTER — Ambulatory Visit (INDEPENDENT_AMBULATORY_CARE_PROVIDER_SITE_OTHER): Payer: 59 | Admitting: Gynecology

## 2018-01-20 VITALS — BP 124/82

## 2018-01-20 DIAGNOSIS — N950 Postmenopausal bleeding: Secondary | ICD-10-CM | POA: Diagnosis not present

## 2018-01-20 NOTE — Progress Notes (Signed)
    Monica Solis [birth year] [id number]        57 y.o.  G1P0010 presents having started a "period" this past week.  Now tapering off.  Patient notes having cramping and premenstrual type symptoms that she had in the past.  She has a history of endometrial polyp resection 09/2016.  No bleeding since until now.  Past medical history,surgical history, problem list, medications, allergies, family history and social history were all reviewed and documented in the EPIC chart.  Directed ROS with pertinent positives and negatives documented in the history of present illness/assessment and plan.  Exam: Monica Solis assistant Vitals:   01/20/18 1104  BP: 124/82   General appearance:  Normal Abdomen soft nontender without masses guarding rebound Pelvic external BUS vagina with atrophic changes.  Cervix grossly normal.  No active bleeding.  Bimanual without gross masses or tenderness.  Able to palpate uterus.  Assessment/Plan:  57 y.o. G1P0010 with episode of postmenopausal bleeding described as a normal period.  Reviewed differential to include possible escape ovulation versus atrophic, hyperplastic, polyp.  Doubt significant atypical pathology given D&C 16 months ago normal.  Recommend starting with sonohysterogram for endometrial assessment and sampling as needed.  Agrees to schedule and will follow-up for this.  We have discussed various scenarios to include if no significant pathology then expectant management.  If polyps or other pathology then possible repeat hysteroscopy D&C.    Greater than 50% of my 15-minute office visit was spent in review of prior records and surgery as well as direct face to face counseling and coordination of care with the patient.     Anastasio Auerbach MD, 11:19 AM 01/20/2018

## 2018-01-20 NOTE — Patient Instructions (Signed)
Follow-up for the ultrasound as scheduled. 

## 2018-01-27 DIAGNOSIS — G4733 Obstructive sleep apnea (adult) (pediatric): Secondary | ICD-10-CM | POA: Diagnosis not present

## 2018-02-03 ENCOUNTER — Other Ambulatory Visit: Payer: Self-pay | Admitting: Gynecology

## 2018-02-03 DIAGNOSIS — N950 Postmenopausal bleeding: Secondary | ICD-10-CM

## 2018-02-10 ENCOUNTER — Ambulatory Visit (INDEPENDENT_AMBULATORY_CARE_PROVIDER_SITE_OTHER): Payer: 59

## 2018-02-10 DIAGNOSIS — I5022 Chronic systolic (congestive) heart failure: Secondary | ICD-10-CM

## 2018-02-10 DIAGNOSIS — Z95810 Presence of automatic (implantable) cardiac defibrillator: Secondary | ICD-10-CM | POA: Diagnosis not present

## 2018-02-11 NOTE — Progress Notes (Signed)
EPIC Encounter for ICM Monitoring  Patient Name: Monica Solis is a 57 y.o. female Date: 02/11/2018 Primary Care Physican: Reynold Bowen, MD Primary Cardiologist:Berry Electrophysiologist:Taylor Dry Weight:330 lbs(weighs daily)         Heart Failure questions reviewed, pt asymptomatic.   02/10/2018 [facility] HF Index: 0  No diuretic  Recommendations: No changes.   Encouraged to call for fluid symptoms.  Follow-up plan: ICM clinic phone appointment on 03/20/2018.          Copy of ICM check sent to Dr. Lovena Le.    3 Month Trend    8 Day Data Trend           [name], RN 02/11/2018 4:43 PM

## 2018-02-13 ENCOUNTER — Encounter: Payer: Self-pay | Admitting: Gynecology

## 2018-02-13 ENCOUNTER — Ambulatory Visit (INDEPENDENT_AMBULATORY_CARE_PROVIDER_SITE_OTHER): Payer: 59

## 2018-02-13 ENCOUNTER — Ambulatory Visit (INDEPENDENT_AMBULATORY_CARE_PROVIDER_SITE_OTHER): Payer: 59 | Admitting: Gynecology

## 2018-02-13 VITALS — BP 122/78

## 2018-02-13 DIAGNOSIS — N950 Postmenopausal bleeding: Secondary | ICD-10-CM | POA: Diagnosis not present

## 2018-02-13 DIAGNOSIS — D261 Other benign neoplasm of corpus uteri: Secondary | ICD-10-CM | POA: Diagnosis not present

## 2018-02-13 NOTE — Progress Notes (Addendum)
    Monica Solis 31-Dec-1960 [id number]        57 y.o.  G1P0010 presents for sonohysterogram due to an episode of menstrual-like bleeding earlier this month.  History of endometrial polyp resection 09/2016.  No bleeding up until this month.  Past medical history,surgical history, problem list, medications, allergies, family history and social history were all reviewed and documented in the EPIC chart.  Directed ROS with pertinent positives and negatives documented in the history of present illness/assessment and plan.  Exam: [place] assistant Vitals:   02/13/18 1008  BP: 122/78   General appearance:  Normal Menz soft nontender without masses guarding rebound Pelvic external BUS vagina with atrophic changes.  Cervix with atrophic changes.  Uterus unable to palpate but no gross masses or tenderness on bimanual.  Ultrasound transvaginal and transabdominal shows uterus normal size with small myoma 18 mm.  Endometrial echo 4.4 mm.  Right and left ovaries normal.  Cul-de-sac negative  Sonohysterogram performed, sterile technique  normal.  Cul-de-sac negative.,  Easy catheter introduction, good distention with no abnormalities.  Endometrial cavity clearly visualized.  Endometrial sample taken.  Patient tolerated well.  Assessment/Plan:  57 y.o. G1P0010 with episode of postmenopausal bleeding as a menstrual type flow.  Sonohysterogram is negative.  I suspect that she had an escape ovulation.  Atrophic bleeding also discussed.  Endometrial cavity clearly visualized with no pathology seen.  Patient will follow-up for biopsy results.  Assuming negative then plan expectant management with reporting any further bleeding.    Anastasio Auerbach MD, 10:27 AM 02/13/2018

## 2018-02-13 NOTE — Patient Instructions (Addendum)
Office will call you with the biopsy results.  Call if any further bleeding.

## 2018-02-26 DIAGNOSIS — G4733 Obstructive sleep apnea (adult) (pediatric): Secondary | ICD-10-CM | POA: Diagnosis not present

## 2018-03-04 ENCOUNTER — Encounter: Payer: Self-pay | Admitting: Cardiology

## 2018-03-04 ENCOUNTER — Ambulatory Visit (INDEPENDENT_AMBULATORY_CARE_PROVIDER_SITE_OTHER): Payer: 59 | Admitting: Cardiology

## 2018-03-04 VITALS — BP 129/86 | HR 69 | Ht 69.0 in | Wt 335.0 lb

## 2018-03-04 DIAGNOSIS — I428 Other cardiomyopathies: Secondary | ICD-10-CM

## 2018-03-04 DIAGNOSIS — I454 Nonspecific intraventricular block: Secondary | ICD-10-CM | POA: Diagnosis not present

## 2018-03-04 DIAGNOSIS — Z95810 Presence of automatic (implantable) cardiac defibrillator: Secondary | ICD-10-CM | POA: Diagnosis not present

## 2018-03-04 DIAGNOSIS — Z9989 Dependence on other enabling machines and devices: Secondary | ICD-10-CM | POA: Diagnosis not present

## 2018-03-04 DIAGNOSIS — Z79899 Other long term (current) drug therapy: Secondary | ICD-10-CM

## 2018-03-04 DIAGNOSIS — D6851 Activated protein C resistance: Secondary | ICD-10-CM | POA: Diagnosis not present

## 2018-03-04 DIAGNOSIS — E66813 Obesity, class 3: Secondary | ICD-10-CM

## 2018-03-04 DIAGNOSIS — Z794 Long term (current) use of insulin: Secondary | ICD-10-CM | POA: Diagnosis not present

## 2018-03-04 DIAGNOSIS — E118 Type 2 diabetes mellitus with unspecified complications: Secondary | ICD-10-CM

## 2018-03-04 DIAGNOSIS — G4733 Obstructive sleep apnea (adult) (pediatric): Secondary | ICD-10-CM

## 2018-03-04 DIAGNOSIS — Z0389 Encounter for observation for other suspected diseases and conditions ruled out: Secondary | ICD-10-CM | POA: Diagnosis not present

## 2018-03-04 DIAGNOSIS — IMO0001 Reserved for inherently not codable concepts without codable children: Secondary | ICD-10-CM | POA: Insufficient documentation

## 2018-03-04 MED ORDER — SACUBITRIL-VALSARTAN 24-26 MG PO TABS: 1.0000 | ORAL_TABLET | Freq: Two times a day (BID) | ORAL | 3 refills | Status: DC

## 2018-03-04 NOTE — Assessment & Plan Note (Signed)
Factor V Leiden deficiency with a history of DVT and PE in 2011 Worked up in [facility] by Dr Jacqulyn Ducking- she was on Coumadin for 8 months then high dose ASA till last year when she had bleeding issues

## 2018-03-04 NOTE — Assessment & Plan Note (Signed)
EF 30-35% March 2018- Aug 2018- EF improved to 55% by echo March 2019 after Biv ICD in Sept 2018 

## 2018-03-04 NOTE — Assessment & Plan Note (Signed)
Followed by PCP

## 2018-03-04 NOTE — Assessment & Plan Note (Signed)
She reports compliance with C-pap 

## 2018-03-04 NOTE — Assessment & Plan Note (Addendum)
BMI 49- Pt was originally evaluated for pre op clearance for bariatric surgery which the pt says is now off the table 

## 2018-03-04 NOTE — Patient Instructions (Signed)
Medication Instructions:  No medication changes  Labwork: Your physician recommends that you return for lab work in: Today (CBC/BMP)    Follow-Up: Your physician recommends that you schedule a follow-up appointment in: 3 months with Dr. Gwenlyn Found   Any Other Special Instructions Will Be Listed Below (If Applicable).     If you need a refill on your cardiac medications before your next appointment, please call your pharmacy.

## 2018-03-04 NOTE — Progress Notes (Signed)
03/04/2018 Monica Solis   04-29-1961  [id number]   Primary Physician Reynold Bowen, MD Primary Cardiologist: Dr Gwenlyn Found- Dr Lovena Le EP  HPI:  57 y/o female seen by Dr Gwenlyn Found for pre op clearance (bariatric surgery) in Jan 2018. She had risk factors for CAD and a LBBB on her EKG. Myoview Feb 2018 was abnormal with an EF of 35%. The pt also gave a history of chest pain. Diagnostic cath was done March 2018 and revealed normal coronaries. She was placed on medications and a f/u echo was done in Aug 2018. This revealed no change in her EF-30-35%. She was seen by Dr Lovena Le and underwent implantation of a [employer] V ICD. Her most recent echo March 2019 showed her EF to be 55-60%. She has had problems tolerating her medications secondary to hypotension. Her HCTZ, Coreg, and most recently her Delene Loll have all been decreased  secondary to orthostatic symptoms. She is in the office today for follow up. Her B/P is 130/86. She has less dizziness but now has some DOE and fatigue.    Current Outpatient Medications  Medication Sig Dispense Refill  . calcium carbonate (TUMS - DOSED IN MG ELEMENTAL CALCIUM) 500 MG chewable tablet Chew 1 tablet by mouth daily as needed for indigestion or heartburn.    . carvedilol (COREG) 3.125 MG tablet Take 1 tablet (3.125 mg total) by mouth 2 (two) times daily. 180 tablet 3  . Cholecalciferol (VITAMIN D3) 2000 UNITS capsule Take 4,000 Units by mouth daily with supper.     . diclofenac sodium (VOLTAREN) 1 % GEL Apply 2 g topically 4 (four) times daily.     . fexofenadine (ALLEGRA) 180 MG tablet Take 180 mg by mouth daily as needed for allergies or rhinitis.    [name] gabapentin (NEURONTIN) 100 MG capsule Take 200 mg by mouth at bedtime.     . hydrochlorothiazide (HYDRODIURIL) 12.5 MG tablet Take 12.5 mg by mouth daily as needed (fluid).     . insulin aspart (NOVOLOG) 100 UNIT/ML injection Inject 7-10 Units into the skin 3 (three) times daily as needed for high blood  sugar. Per SSC 7-10units when cbg above 120    . insulin detemir (LEVEMIR) 100 UNIT/ML injection Inject 34 Units into the skin at bedtime.     . Iron-Vitamins (GERITOL COMPLETE) TABS Take 1 tablet by mouth daily with supper.     . levothyroxine (SYNTHROID, LEVOTHROID) 75 MCG tablet Take 75 mcg by mouth daily before breakfast.     . Menthol, Topical Analgesic, (BENGAY EX) Apply 1 application topically 2 (two) times daily as needed (pain).    [name] omeprazole (PRILOSEC) 40 MG capsule Take 40 mg by mouth at bedtime.     . Probiotic Product (PROBIOTIC DAILY) CAPS Take 1 capsule by mouth daily with supper.     . sacubitril-valsartan (ENTRESTO) 24-26 MG Take 1 tablet by mouth 2 (two) times daily. 180 tablet 3   No current facility-administered medications for this visit.    Facility-Administered Medications Ordered in Other Visits  Medication Dose Route Frequency Provider Last Rate Last Dose  . technetium tetrofosmin (TC-MYOVIEW) injection 40.8 millicurie  14.4 millicurie Intravenous Once PRN Hilty, Nadean Corwin, MD        Allergies  Allergen Reactions  . Crestor [Rosuvastatin Calcium] Other (See Comments)    Muscle aches   . Other Swelling    Metal  . Benadryl [Diphenhydramine Hcl] Hives  . Invokana [Canagliflozin] Other (See Comments)    Loss of  bladder control   . Metformin Other (See Comments)    Kidney damage  . Pravastatin Other (See Comments)    Muscle ache  . Tramadol Other (See Comments)    "nightmares"  . Victoza [Liraglutide] Other (See Comments)    "Fainting"    Past Medical History:  Diagnosis Date  . Abnormal uterine bleeding (AUB)   . AICD (automatic cardioverter/defibrillator) present   . At risk for sleep apnea    STOP-BANG=  5       SENT TO PCP 10-01-2016  . Blood clot in vein    "superficial blood clots in BLE; I had 6-7 since 2000" (06/26/2017)  . BMI 45.0-49.9, adult ([place])   . Chronic bronchitis ([place])   . Diverticulosis of colon   . Endometrial polyp   . Factor  5 Leiden mutation, heterozygous ([place])    dx 2011  . GERD (gastroesophageal reflux disease)   . High cholesterol   . History of DVT of lower extremity 2000; 2011   "I've had them in both"  . History of ectopic pregnancy 1998  . Hypertension   . Hypothyroidism   . Left bundle branch block (LBBB)   . Osteoarthritis    "all over" (06/26/2017)  . Peripheral neuropathy   . PONV (postoperative nausea and vomiting)   . Type 2 diabetes mellitus treated with insulin ([facility])     Social History   Socioeconomic History  . Marital status: Married    Spouse name: Not on file  . Number of children: Not on file  . Years of education: Not on file  . Highest education level: Not on file  Occupational History  . Not on file  Social Needs  . Financial resource strain: Not on file  . Food insecurity:    Worry: Not on file    Inability: Not on file  . Transportation needs:    Medical: Not on file    Non-medical: Not on file  Tobacco Use  . Smoking status: Former Smoker    Packs/day: 0.10    Years: 2.00    Pack years: 0.20    Types: Cigarettes    Last attempt to quit: 10/02/1979    Years since quitting: 38.4  . Smokeless tobacco: Never Used  Substance and Sexual Activity  . Alcohol use: No    Alcohol/week: 0.0 oz  . Drug use: No  . Sexual activity: Not Currently    Partners: Male    Birth control/protection: Surgical    Comment: 1st intercourse 32 yo-1 partner-BTL  Lifestyle  . Physical activity:    Days per week: Not on file    Minutes per session: Not on file  . Stress: Not on file  Relationships  . Social connections:    Talks on phone: Not on file    Gets together: Not on file    Attends religious service: Not on file    Active member of club or organization: Not on file    Attends meetings of clubs or organizations: Not on file    Relationship status: Not on file  . Intimate partner violence:    Fear of current or ex partner: Not on file    Emotionally abused: Not on file     Physically abused: Not on file    Forced sexual activity: Not on file  Other Topics Concern  . Not on file  Social History Narrative  . Not on file     Family History  Problem Relation Age of  Onset  . Hypertension Mother   . Heart disease Mother   . Alzheimer's disease Mother   . Heart disease Father   . Breast cancer Maternal Aunt        30's  . Alzheimer's disease Maternal Aunt   . Diabetes Maternal Grandmother   . Heart disease Maternal Grandmother   . Heart disease Paternal Grandmother   . Cancer Maternal Grandfather        Unknown type  . Mitral valve prolapse Sister   . Heart disease Paternal Grandfather      Review of Systems: General: negative for chills, fever, night sweats or weight changes.  Cardiovascular: negative for chest pain, dyspnea on exertion, edema, orthopnea, palpitations, paroxysmal nocturnal dyspnea or shortness of breath Dermatological: negative for rash Respiratory: negative for cough or wheezing Urologic: negative for hematuria Abdominal: negative for nausea, vomiting, diarrhea, bright red blood per rectum, melena, or hematemesis Neurologic: negative for visual changes, syncope, or dizziness All other systems reviewed and are otherwise negative except as noted above.    Blood pressure 129/86, pulse 69, height 5\' 9"  (1.753 m), weight (!) 335 lb (152 kg), last menstrual period 08/07/2016.  General appearance: alert, cooperative, no distress and morbidly obese Lungs: clear to auscultation bilaterally Heart: regular rate and rhythm Extremities: trace edema Skin: Skin color, texture, turgor normal. No rashes or lesions Neurologic: Grossly normal  EKG Paced  ASSESSMENT AND PLAN:   Non-ischemic cardiomyopathy ([place]) EF 30-35% March 2018- Aug 2018- EF improved to 55% by echo March 2019 after Biv ICD in Sept 2018  Normal coronary arteries Normal coronaries at cath March 2018  Obesity BMI 49- Pt was originally evaluated for pre op  clearance for bariatric surgery which the pt says is now off the table  Obstructive sleep apnea treated with CPAP She reports compliance with C-pap  ICD (implantable cardioverter-defibrillator) in place BS BiV ICD implanted Sept 2018  Factor 5 Leiden mutation, heterozygous ([place]) Factor V Leiden deficiency with a history of DVT and PE in 2011 Worked up in [facility] by Dr Jacqulyn Ducking- she was on Coumadin for 8 months then high dose ASA till last year when she had bleeding issues  Type 2 diabetes mellitus with complication, with long-term current use of insulin ([place]) Followed by PCP   PLAN  I suggested she try and increase her Entresto and stay on the low dose of Coreg. I felt that would give her the best results with the fewest side effects. She declined this and wants to stay on the low dose Entresto. I did order labs today- CBC and BMP. She'll follow up with Dr Gwenlyn Found in Aug.   Kerin Ransom PA-C 03/04/2018 9:32 AM

## 2018-03-04 NOTE — Assessment & Plan Note (Signed)
BS BiV ICD implanted Sept 2018 

## 2018-03-04 NOTE — Assessment & Plan Note (Signed)
Normal coronaries at cath March 2018 

## 2018-03-05 LAB — BASIC METABOLIC PANEL
BUN/Creatinine Ratio: 19 (ref 9–23)
BUN: 13 mg/dL (ref 6–24)
CO2: 21 mmol/L (ref 20–29)
Calcium: 9.9 mg/dL (ref 8.7–10.2)
Chloride: 102 mmol/L (ref 96–106)
Creatinine, Ser: 0.7 mg/dL (ref 0.57–1.00)
GFR calc Af Amer: 112 mL/min/{1.73_m2} (ref >59)
GFR calc non Af Amer: 97 mL/min/{1.73_m2} (ref >59)
Glucose: 147 mg/dL — ABNORMAL HIGH (ref 65–99)
Potassium: 4.4 mmol/L (ref 3.5–5.2)
Sodium: 139 mmol/L (ref 134–144)

## 2018-03-05 LAB — CBC
Hematocrit: 45.7 % (ref 34.0–46.6)
Hemoglobin: 15.5 g/dL (ref 11.1–15.9)
MCH: 31.1 pg (ref 26.6–33.0)
MCHC: 33.9 g/dL (ref 31.5–35.7)
MCV: 92 fL (ref 79–97)
Platelets: 250 10*3/uL (ref 150–450)
RBC: 4.99 x10E6/uL (ref 3.77–5.28)
RDW: 13.5 % (ref 12.3–15.4)
WBC: 5.9 10*3/uL (ref 3.4–10.8)

## 2018-03-20 ENCOUNTER — Ambulatory Visit (INDEPENDENT_AMBULATORY_CARE_PROVIDER_SITE_OTHER): Payer: 59

## 2018-03-20 DIAGNOSIS — I5022 Chronic systolic (congestive) heart failure: Secondary | ICD-10-CM

## 2018-03-20 DIAGNOSIS — Z95810 Presence of automatic (implantable) cardiac defibrillator: Secondary | ICD-10-CM

## 2018-03-21 ENCOUNTER — Telehealth: Payer: Self-pay

## 2018-03-21 NOTE — Telephone Encounter (Signed)
Remote ICM transmission received.  Attempted call to patient and left detailed message, per DPR, regarding transmission and next ICM scheduled for 04/21/2018.  Advised to return call for any fluid symptoms or questions.

## 2018-03-21 NOTE — Progress Notes (Signed)
EPIC Encounter for ICM Monitoring  Patient Name: Monica Solis is a 57 y.o. female Date: 03/21/2018 Primary Care Physican: Reynold Bowen, MD Primary Cardiologist:Berry Electrophysiologist:Taylor Dry Weight:Previous weight 330 lbs(weighs daily)  Attempted call to patient and unable to reach.  Left detailed message, per DPR, regarding transmission.  Transmission reviewed.    03/16/2018 [facility] HF Index: 1 = normal below threshold    No diuretic  Recommendations: Left voice mail with ICM number and encouraged to call if experiencing any fluid symptoms.  Follow-up plan: ICM clinic phone appointment on 04/21/2018.          Copy of ICM check sent to Dr. Lovena Le.  3 Month Trend    8 Day Data Trend            [name], RN 03/21/2018 2:44 PM

## 2018-03-29 DIAGNOSIS — G4733 Obstructive sleep apnea (adult) (pediatric): Secondary | ICD-10-CM | POA: Diagnosis not present

## 2018-04-01 ENCOUNTER — Encounter: Payer: Self-pay | Admitting: Neurology

## 2018-04-02 DIAGNOSIS — E038 Other specified hypothyroidism: Secondary | ICD-10-CM | POA: Diagnosis not present

## 2018-04-02 DIAGNOSIS — E1142 Type 2 diabetes mellitus with diabetic polyneuropathy: Secondary | ICD-10-CM | POA: Diagnosis not present

## 2018-04-02 DIAGNOSIS — E1151 Type 2 diabetes mellitus with diabetic peripheral angiopathy without gangrene: Secondary | ICD-10-CM | POA: Diagnosis not present

## 2018-04-02 DIAGNOSIS — I255 Ischemic cardiomyopathy: Secondary | ICD-10-CM | POA: Diagnosis not present

## 2018-04-03 ENCOUNTER — Encounter: Payer: Self-pay | Admitting: Neurology

## 2018-04-03 ENCOUNTER — Ambulatory Visit (INDEPENDENT_AMBULATORY_CARE_PROVIDER_SITE_OTHER): Payer: 59 | Admitting: Neurology

## 2018-04-03 VITALS — BP 121/80 | HR 74 | Ht 69.0 in | Wt 335.0 lb

## 2018-04-03 DIAGNOSIS — M25471 Effusion, right ankle: Secondary | ICD-10-CM

## 2018-04-03 DIAGNOSIS — M25472 Effusion, left ankle: Secondary | ICD-10-CM | POA: Diagnosis not present

## 2018-04-03 DIAGNOSIS — G4730 Sleep apnea, unspecified: Secondary | ICD-10-CM | POA: Diagnosis not present

## 2018-04-03 DIAGNOSIS — G4731 Primary central sleep apnea: Secondary | ICD-10-CM | POA: Diagnosis not present

## 2018-04-03 NOTE — Progress Notes (Signed)
SLEEP MEDICINE CLINIC   Provider:  Larey Seat, M D  Primary Care Physician:  Reynold Bowen, MD   Referring Provider: Reynold Bowen, MD , Endocrinologist at [facility].    Chief Complaint  Patient presents with  . Follow-up    pt alone, rm 10. pt states that she still sometimes has a problem with the pressure blowing her mask she had max pressure dropped from 15-12 then down to 10. she states that at 10 it is going much better then before at the other pressures.     HPI:  Monica Solis is a 57 y.o. female , seen here as in a referral  from Dr. Forde Dandy for a sleep evaluation- she reports feeling exhausted and strained by minor activity. Her STOP Bang score was 5 - urging an evaluation.   [name] is a 57 year old, right-handed Caucasian female patient, who presents with a significant diagnosis of nonischemic cardiac myopathy.  She has a history of high blood pressure, high cholesterol, diabetes, GERD, lower extremity dependent edema, left bundle branch block and factor V Leiden hypothyroidism diverticulosis uterine fibroids, she had a cardiac defibrillator implanted on June 26, 2017. She reports getting a little bit more strength back now. Her next cardiology appointment is on Dec 14th 2018, Dr Crissie Sickles, electrophysiology.  After the implantation in hospital she was recorded having hypoxemia and irregular breathing.  " In 2011 I was diagnosed with EF 55% and LBBB.  I had pain in my chest throughout  2017 and early 2018- and was told it was  GERD. I was asking for a referral to bariatric surgery and there I was diagnosed with CHF class 3- and changed doctors. The heart failure was just diagnosed earlier this year".  [name] also stated that her mother suffers from a cardiomyopathy. She follows Quay Burow, MD for cardiology .   Sleep habits are as follows:  The patient usually watches TV in the den before retreating to her bedroom between 11 and 11:30 PM.  She  may still read for a while, and if she cannot fall asleep within 30-60 minutes she will get back up.  Lately, she has had frequently difficulties initiating sleep within an hour.  She would go back to the den.  She may play on her iPad or watch TV again until she feels sleepy enough to try.  Between 2 and 3 AM she is usually asleep.  She was troubled by nocturia before the defibrillator was implanted, now she only rises once at night.  She usually can sleep through until 5 AM when her husband rises. She may get another 1-2 hours of sleep. In her working years she rose at 5 AM.   She always wakes with a very dry mouth, and headaches  ( dull and throbbing) and frequently feeling clammy, with night sweats, palpitations.   The patient underwent a sleep study by HST - attended sleep study was not authorized by her health insurance.  NAME: Monica Solis                                                       DOB: 07/08/1961 MEDICAL RECORD NUMBER [id number]  DOS: 09/25/2017 REFERRING PHYSICIAN: Reynold Bowen, M.D.  STUDY PERFORMED: HST watch pat  HISTORY: Her STOP Bang score was 5 - urging an evaluation. Mrs. [name] is a 57 year old, right-handed Caucasian female patient, who presents with a significant diagnosis of non-ischemic cardiac myopathy.  She has a history of high blood pressure, high cholesterol, diabetes, GERD, lower extremity dependent edema, left bundle branch block and factor V Leiden, hypothyroidism, diverticulosis, uterine fibroids, she had a cardiac defibrillator implanted on June 26, 2017.  While in hospital she was recorded having hypoxemia and irregular breathing. Epworth score 3 points, Fatigue severity score 40 points - BMI: 49.6  STUDY RESULTS: Total Recording Time: 7 hours 28 minutes Total Apnea/Hypopnea Index (AHI):  18.1 /hour; RDI 18.3/hr.  Average Oxygen Saturation:  92 %; Lowest Oxygen Desaturation:  79%  Total Time Oxygen Saturation Below 89%:  5.5% =19.9 minutes  Average Heart Rate: 72 bpm (55-92)   IMPRESSION: Mild- Moderate obstructive sleep apnea, no central apneas emerging.  Desaturation time was low. REM sleep accentuated apnea.  RECOMMENDATION:  CPAP is recommended for REM dependent apnea and cardiac arrhythmia or failure patients. I will order auto CPAP 5-15 cm water, heated humidity and interface of choice.  I certify that I have reviewed the raw data recording prior to the issuance of this report in accordance with the standards of the American Academy of Sleep Medicine (AASM). Larey Seat, M.D.      10-05-2017  Medical Director of [facility] at [facility] [place], [place] and [facility] and accredited by AASM  Sleep medical history and family sleep history: mother with cardiomyopathy. Mother has OSA on CPAP.  Ejection fracture recovered to 55 % since last seen.  She has a pacemaker- defibrillator implanted 06-26-2017.  Interval history from 03 April 2018, I have the pleasure of meeting with [name] today a 57 year old Caucasian female that has been using auto titration CPAP for the last 5 months - January 15th 2019.  The patient has 100% compliance 4 hours and days, with an average use at time of 7 hours 28 minutes, minimum pressure set at 5 maximum pressure at 10 cmH2O with 3 cm EPR, 95th percentile pressure is exactly 10 cmH2O.  Her residual AHI is now 0.4. She feels better, she is more rested and  less fatigued- her upper limit CPAP pressure was adjusted from 15 to 10 cm last visit with Cecille Rubin, NP.   Minor air leaks .    Review of Systems: Out of a complete 14 system review, the patient complains of only the following symptoms, and all other reviewed systems are negative.  Reports some air leaks- often sleeps through.  Epworth 6, FSS 16.   She would like to start CPAP at 6 cm water - 10 cm water.     Social History   Socioeconomic History  . Marital status: Married    Spouse name: Not on file  . Number of  children: Not on file  . Years of education: Not on file  . Highest education level: Not on file  Occupational History  . Not on file  Social Needs  . Financial resource strain: Not on file  . Food insecurity:    Worry: Not on file    Inability: Not on file  . Transportation needs:    Medical: Not on file    Non-medical: Not on file  Tobacco Use  . Smoking status: Former Smoker    Packs/day: 0.10    Years: 2.00  Pack years: 0.20    Types: Cigarettes    Last attempt to quit: 10/02/1979    Years since quitting: 38.5  . Smokeless tobacco: Never Used  Substance and Sexual Activity  . Alcohol use: No    Alcohol/week: 0.0 oz  . Drug use: No  . Sexual activity: Not Currently    Partners: Male    Birth control/protection: Surgical    Comment: 1st intercourse 32 yo-1 partner-BTL  Lifestyle  . Physical activity:    Days per week: Not on file    Minutes per session: Not on file  . Stress: Not on file  Relationships  . Social connections:    Talks on phone: Not on file    Gets together: Not on file    Attends religious service: Not on file    Active member of club or organization: Not on file    Attends meetings of clubs or organizations: Not on file    Relationship status: Not on file  . Intimate partner violence:    Fear of current or ex partner: Not on file    Emotionally abused: Not on file    Physically abused: Not on file    Forced sexual activity: Not on file  Other Topics Concern  . Not on file  Social History Narrative  . Not on file    Family History  Problem Relation Age of Onset  . Hypertension Mother   . Heart disease Mother   . Alzheimer's disease Mother   . Heart disease Father   . Breast cancer Maternal Aunt        30's  . Alzheimer's disease Maternal Aunt   . Diabetes Maternal Grandmother   . Heart disease Maternal Grandmother   . Heart disease Paternal Grandmother   . Cancer Maternal Grandfather        Unknown type  . Mitral valve prolapse  Sister   . Heart disease Paternal Grandfather     Past Medical History:  Diagnosis Date  . Abnormal uterine bleeding (AUB)   . AICD (automatic cardioverter/defibrillator) present   . At risk for sleep apnea    STOP-BANG=  5       SENT TO PCP 10-01-2016  . Blood clot in vein    "superficial blood clots in BLE; I had 6-7 since 2000" (06/26/2017)  . BMI 45.0-49.9, adult ([place])   . Chronic bronchitis ([place])   . Diverticulosis of colon   . Endometrial polyp   . Factor 5 Leiden mutation, heterozygous ([place])    dx 2011  . GERD (gastroesophageal reflux disease)   . High cholesterol   . History of DVT of lower extremity 2000; 2011   "I've had them in both"  . History of ectopic pregnancy 1998  . Hypertension   . Hypothyroidism   . Left bundle branch block (LBBB)   . Osteoarthritis    "all over" (06/26/2017)  . Peripheral neuropathy   . PONV (postoperative nausea and vomiting)   . Type 2 diabetes mellitus treated with insulin [facility])     Past Surgical History:  Procedure Laterality Date  . BI-VENTRICULAR IMPLANTABLE CARDIOVERTER DEFIBRILLATOR  (CRT-D)  06/26/2017  . BIV ICD INSERTION CRT-D N/A 06/26/2017   Procedure: BIV ICD INSERTION CRT-D;  Surgeon: Evans Lance, MD;  Location: [facility];  Service: Cardiovascular;  Laterality: N/A;  . COLONOSCOPY  2013  . DILATATION & CURETTAGE/HYSTEROSCOPY WITH MYOSURE N/A 10/05/2016   Procedure: DILATATION & CURETTAGE/HYSTEROSCOPY WITH MYOSURE;  Surgeon: Christia Reading  Corey Skains, MD;  Location: [facility];  Service: Gynecology;  Laterality: N/A;  requests to follow 7:30am caserequests one hour OR time  . DILATION AND CURETTAGE OF UTERUS  12/06/2007  . LAPAROSCOPY BILATERAL TUBAL LIGATION WITH FALOPE RINGS/  D&C HYSTEROSCOPY WITH POLYPECTOMY  12/06/2007   and Lysis Adhesions  . LEFT HEART CATH AND CORONARY ANGIOGRAPHY N/A 01/03/2017   Procedure: Left Heart Cath and Coronary Angiography;  Surgeon: Lorretta Harp, MD;  Location: [facility];  Service: Cardiovascular;  Laterality: N/A;  . TUBAL LIGATION  12/06/2007    Current Outpatient Medications  Medication Sig Dispense Refill  . calcium carbonate (TUMS - DOSED IN MG ELEMENTAL CALCIUM) 500 MG chewable tablet Chew 1 tablet by mouth daily as needed for indigestion or heartburn.    . Cholecalciferol (VITAMIN D3) 2000 UNITS capsule Take 4,000 Units by mouth daily with supper.     . diclofenac sodium (VOLTAREN) 1 % GEL Apply 2 g topically 4 (four) times daily.     . fexofenadine (ALLEGRA) 180 MG tablet Take 180 mg by mouth daily as needed for allergies or rhinitis.    [name] gabapentin (NEURONTIN) 100 MG capsule Take 200 mg by mouth at bedtime.     . hydrochlorothiazide (HYDRODIURIL) 12.5 MG tablet Take 12.5 mg by mouth daily as needed (fluid).     . insulin aspart (NOVOLOG) 100 UNIT/ML injection Inject 7-10 Units into the skin 3 (three) times daily as needed for high blood sugar. Per SSC 7-10units when cbg above 120    . insulin detemir (LEVEMIR) 100 UNIT/ML injection Inject 34 Units into the skin at bedtime.     . Iron-Vitamins (GERITOL COMPLETE) TABS Take 1 tablet by mouth daily with supper.     . levothyroxine (SYNTHROID, LEVOTHROID) 75 MCG tablet Take 75 mcg by mouth daily before breakfast.     . Menthol, Topical Analgesic, (BENGAY EX) Apply 1 application topically 2 (two) times daily as needed (pain).    [name] omeprazole (PRILOSEC) 40 MG capsule Take 40 mg by mouth at bedtime.     . Probiotic Product (PROBIOTIC DAILY) CAPS Take 1 capsule by mouth daily with supper.     . sacubitril-valsartan (ENTRESTO) 24-26 MG Take 1 tablet by mouth 2 (two) times daily. 180 tablet 3  . carvedilol (COREG) 3.125 MG tablet Take 1 tablet (3.125 mg total) by mouth 2 (two) times daily. 180 tablet 3   No current facility-administered medications for this visit.    Facility-Administered Medications Ordered in Other Visits  Medication Dose Route Frequency Provider Last Rate Last Dose  .  technetium tetrofosmin (TC-MYOVIEW) injection 79.0 millicurie  24.0 millicurie Intravenous Once PRN Pixie Casino, MD        Allergies as of 04/03/2018 - Review Complete 04/03/2018  Allergen Reaction Noted  . Crestor [rosuvastatin calcium] Other (See Comments) 06/24/2017  . Other Swelling 01/01/2017  . Benadryl [diphenhydramine hcl] Hives 07/10/2011  . Invokana [canagliflozin] Other (See Comments) 09/14/2016  . Metformin Other (See Comments) 05/29/2016  . Pravastatin Other (See Comments) 08/10/2014  . Tramadol Other (See Comments) 10/01/2016  . Victoza [liraglutide] Other (See Comments) 09/14/2016    Vitals: BP 121/80   Pulse 74   Ht 5\' 9"  (1.753 m)   Wt (!) 335 lb (152 kg)   LMP 08/07/2016   BMI 49.47 kg/m  Last Weight:  Wt Readings from Last 1 Encounters:  04/03/18 (!) 335 lb (152 kg)   XBD:ZHGD mass index is 49.47 kg/m.  Last Height:   Ht Readings from Last 1 Encounters:  04/03/18 5\' 9"  (1.753 m)    Physical exam:  General: The patient is awake, alert and appears not in acute distress. edentulous  Head: Normocephalic, atraumatic. Neck is supple. Mallampati 2- midline.    neck circumference:17. Nasal airflow patent , a TMJ click is not  evident . Retrognathia is not seen. Crowded lower jaw( dentition)   Cardiovascular:  Regular rate and rhythm , without murmurs or carotid bruit, and without distended neck veins. Respiratory: Lungs are clear to auscultation. Skin:  Ankle edema, no rash- facial pressure marks from CPAP gear.   Trunk: BMI is close to 50. The patient's posture is stooped. Shoulders droopy.  Neurologic exam : Attention span & concentration ability appears normal.  Speech is fluent, without dysarthria, dysphonia or aphasia. Mood and affect are appropriate.  Cranial nerves:Pupils are equal in size, round and briskly reactive to light.Extraocular movements  in vertical and horizontal planes intact and without nystagmus. Visual fields by finger perimetry  are intact. Hearing to finger rub intact.    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 Shoulder droopy,  Motor exam: Normal tone, muscle bulk and symmetric strength in all extremities. Sensory:  Fine touch, pinprick and vibration were normal. Coordination:  Finger-to-nose maneuver  normal without evidence of ataxia, dysmetria or tremor. Gait and station: Patient walks without assistive device .Turns with 3  Steps.  Deep tendon reflexes: in the  upper  extremities are symmetric and intact.  Patella attenuated, achilles absent. Babinski maneuver response is attenuated    Assessment:  After physical and neurologic examination, review of laboratory studies,  Personal review of imaging studies, reports of other /same  Imaging studies, results of polysomnography and / or neurophysiology testing and pre-existing records as far as provided in visit., my assessment is   Since the year 2000 the patient multiple blood clots, she reports no deep venous thrombosis, she has been tested for factor V Leiden and is positive.  She has hypothyroidism, she has diabetes, she has reached the border zone from morbid obesity to super obesity as her BMI is very close to 50.  She had congestive heart failure her ejection fraction recovered from 30% to 55%, she no longer has  obstructive apneas.   The defibrillator will prevent tachycardia,, pacemaker component will prevent her from having significant bradycardia.  It should help with reducing the ankle edema and she has been on HCTZ at AM.   1) OSA or CSA- complex apnea with hypoxemia. given the patient's multiple comorbidities, compliant CPAP user.   2) Morbid obesity with serious co-morbidities.  In addition I would like for Mrs. Hawkes to consider further weight management by medical means before she resorts to a bariatric surgery.  If she likes I will refer her to Dr. Redgie Grayer with the  medical weight  management at [facility] health. She has diverticulitis which made her limit her choice of vegetables and fruits .    3) she sleeps on an incline - elevated head of bed - by wedge- 12 inches. In adition to 3-4 pillows, orthopnea.    I spent more than 20 minutes of face to face time with the patient.  Greater than 50% of time was spent in counseling and coordination of care. We have discussed the diagnosis and differential and I answered the patient's questions.    Plan:  Continue use of CPAP as  directed , I will increase the initial pressure to 6 cm water and take the RAMP off.     Larey Seat, MD 11/15/69, 21:97 AM  Certified in Neurology by ABPN Certified in [place] by [facility] [address], [place], [place] [address]

## 2018-04-07 ENCOUNTER — Ambulatory Visit (INDEPENDENT_AMBULATORY_CARE_PROVIDER_SITE_OTHER): Payer: 59 | Admitting: *Deleted

## 2018-04-07 ENCOUNTER — Telehealth: Payer: Self-pay

## 2018-04-07 DIAGNOSIS — I428 Other cardiomyopathies: Secondary | ICD-10-CM

## 2018-04-07 NOTE — Telephone Encounter (Signed)
Spoke with pt and reminded pt of remote transmission that is due today. Pt verbalized understanding.   

## 2018-04-07 NOTE — Progress Notes (Signed)
Remote ICD transmission.   

## 2018-04-08 ENCOUNTER — Encounter: Payer: Self-pay | Admitting: Cardiology

## 2018-04-10 ENCOUNTER — Ambulatory Visit: Payer: 59 | Admitting: Nurse Practitioner

## 2018-04-11 LAB — CUP PACEART REMOTE DEVICE CHECK
Battery Remaining Longevity: 132 mo
Battery Remaining Percentage: 100 %
Brady Statistic RA Percent Paced: 0 %
Brady Statistic RV Percent Paced: 1 %
HIGH POWER IMPEDANCE MEASURED VALUE: 67 Ohm
Implantable Lead Implant Date: 20180912
Implantable Lead Implant Date: 20180912
Implantable Lead Location: 753860
Implantable Lead Model: 293
Implantable Lead Model: 7741
Implantable Lead Serial Number: 435131
Implantable Lead Serial Number: 813037
Implantable Lead Serial Number: 920623
Lead Channel Impedance Value: 697 Ohm
Lead Channel Setting Pacing Amplitude: 3.5 V
Lead Channel Setting Pacing Amplitude: 3.5 V
Lead Channel Setting Pacing Amplitude: 3.5 V
Lead Channel Setting Pacing Pulse Width: 0.4 ms
MDC IDC LEAD IMPLANT DT: 20180912
MDC IDC LEAD LOCATION: 753858
MDC IDC LEAD LOCATION: 753859
MDC IDC MSMT LEADCHNL LV IMPEDANCE VALUE: 728 Ohm
MDC IDC MSMT LEADCHNL RV IMPEDANCE VALUE: 507 Ohm
MDC IDC PG IMPLANT DT: 20180912
MDC IDC SESS DTM: 20190624071000
MDC IDC SET LEADCHNL LV SENSING SENSITIVITY: 1 mV
MDC IDC SET LEADCHNL RV PACING PULSEWIDTH: 0.4 ms
MDC IDC SET LEADCHNL RV SENSING SENSITIVITY: 0.5 mV
Pulse Gen Serial Number: 193993

## 2018-04-24 ENCOUNTER — Ambulatory Visit (INDEPENDENT_AMBULATORY_CARE_PROVIDER_SITE_OTHER): Payer: 59

## 2018-04-24 DIAGNOSIS — Z95810 Presence of automatic (implantable) cardiac defibrillator: Secondary | ICD-10-CM

## 2018-04-24 DIAGNOSIS — I5022 Chronic systolic (congestive) heart failure: Secondary | ICD-10-CM | POA: Diagnosis not present

## 2018-04-25 ENCOUNTER — Telehealth: Payer: Self-pay

## 2018-04-25 NOTE — Progress Notes (Signed)
EPIC Encounter for ICM Monitoring  Patient Name: Monica Solis is a 57 y.o. female Date: 04/25/2018 Primary Care Physican: Reynold Bowen, MD Primary Cardiologist:Berry Electrophysiologist:Taylor Dry Weight:Previous weight 330 lbs(weighs daily)        Attempted call to patient and unable to reach.  Left detailed message, per DPR, regarding transmission.  Transmission reviewed.    04/21/2018 [facility] HF Index: 5 trending below normal threshold.    No diuretic  Recommendations: Left voice mail with ICM number and encouraged to call if experiencing any fluid symptoms.  Follow-up plan: ICM clinic phone appointment on 05/26/2018.          Copy of ICM check sent to Dr. Lovena Le.              [name], RN 04/25/2018 8:11 AM

## 2018-04-25 NOTE — Telephone Encounter (Signed)
Remote ICM transmission received.  Attempted call to patient and left detailed message, per DPR, regarding transmission and next ICM scheduled for 05/26/2018.  Advised to return call for any fluid symptoms or questions.

## 2018-04-28 DIAGNOSIS — G4733 Obstructive sleep apnea (adult) (pediatric): Secondary | ICD-10-CM | POA: Diagnosis not present

## 2018-04-30 DIAGNOSIS — G4733 Obstructive sleep apnea (adult) (pediatric): Secondary | ICD-10-CM | POA: Diagnosis not present

## 2018-05-26 NOTE — Progress Notes (Signed)
No ICM remote transmission received for 05/26/2018 and next ICM transmission scheduled for 06/02/2018.    

## 2018-05-29 DIAGNOSIS — G4733 Obstructive sleep apnea (adult) (pediatric): Secondary | ICD-10-CM | POA: Diagnosis not present

## 2018-06-02 ENCOUNTER — Telehealth: Payer: Self-pay

## 2018-06-02 NOTE — Telephone Encounter (Signed)
Attempted to confirm remote transmission with pt. No answer and was unable to leave a message.   

## 2018-06-09 NOTE — Progress Notes (Signed)
No ICM remote transmission received for 06/02/2018 and next ICM transmission scheduled for 06/23/2018.

## 2018-06-11 ENCOUNTER — Encounter: Payer: Self-pay | Admitting: Cardiovascular Disease

## 2018-06-11 ENCOUNTER — Ambulatory Visit (INDEPENDENT_AMBULATORY_CARE_PROVIDER_SITE_OTHER): Payer: 59 | Admitting: Cardiovascular Disease

## 2018-06-11 VITALS — BP 147/84 | HR 77 | Ht 69.0 in | Wt 338.8 lb

## 2018-06-11 DIAGNOSIS — I428 Other cardiomyopathies: Secondary | ICD-10-CM | POA: Diagnosis not present

## 2018-06-11 DIAGNOSIS — E7800 Pure hypercholesterolemia, unspecified: Secondary | ICD-10-CM | POA: Diagnosis not present

## 2018-06-11 DIAGNOSIS — I10 Essential (primary) hypertension: Secondary | ICD-10-CM | POA: Diagnosis not present

## 2018-06-11 MED ORDER — SACUBITRIL-VALSARTAN 24-26 MG PO TABS: 1.0000 | ORAL_TABLET | Freq: Two times a day (BID) | ORAL | 3 refills | Status: DC

## 2018-06-11 MED ORDER — CARVEDILOL 3.125 MG PO TABS: 3.1250 mg | ORAL_TABLET | Freq: Two times a day (BID) | ORAL | 3 refills | Status: DC

## 2018-06-11 MED ORDER — EZETIMIBE 10 MG PO TABS: 10.0000 mg | ORAL_TABLET | Freq: Every day | ORAL | 3 refills | Status: DC

## 2018-06-11 NOTE — Patient Instructions (Addendum)
Medication Instructions:  Your physician has recommended you make the following change in your medication:  1) START Zetia 10 mg tablet by mouth DAILY   Labwork: Your physician recommends that you return for lab work in: 3 months FASTING   Testing/Procedures: none  Follow-Up: You have been [facility] to [facility]   We request that you follow-up in: 6 momths with Jana Half and in 12 months with Dr Andria Rhein will receive a reminder letter in the mail two months in advance. If you don't receive a letter, please call our office to schedule the follow-up appointment.    Any Other Special Instructions Will Be Listed Below (If Applicable).     If you need a refill on your cardiac medications before your next appointment, please call your pharmacy.

## 2018-06-11 NOTE — Assessment & Plan Note (Signed)
History of nonischemic cardiomyopathy status post cardiac catheterization by myself 01/07/2017 revealing normal coronaries with an EF of 30 to 35%.  She did have left bundle branch block and underwent ICD implantation/CRT by Dr. Crissie Sickles 06/26/2017 with improvement in her EF up to 50 to 55%.  She feels clinically improved.  She is on carvedilol and Entresto as well.

## 2018-06-11 NOTE — Assessment & Plan Note (Signed)
History of essential hypertension with blood pressure measured to 147/84.  She is on carvedilol, hydrochlorothiazide and Entresto.

## 2018-06-11 NOTE — Progress Notes (Signed)
06/11/2018 Monica Solis   1961-05-07  [id number]  Primary Physician Reynold Bowen, MD Primary Cardiologist: Lorretta Harp MD Monica Solis, [place] severely overweight married Caucasian female with no children who was referred by her general surgeon, Dr. Kieth Brightly for preoperative coronary vessel clearance prior to elective Roux-en-Y bariatric surgery. I last saw her in the office  12/04/2017. Her risk factors for heart disease include treated diabetes, and hypertension. She does have a strong family history of heart disease with a father who died at age 30 of a myocardial infarction and brother at age 8. She has never had a heart attack or stroke. She denies chest pain or shortness of breath. She does have factor V Leiden deficiency has had DVT and pulmonary emboli in the past (2011). I performed a pharmacologic Myoview stress test that showed no evidence of ischemia but gated SPECT showed an ejection fraction of 35%. This was confirmed by 2-D echocardiography. She does now admit to episodes of chest burning which she has thought was reflux in the past. She underwent outpatient coronary angiography. The right radial approach by myself 01/03/17 revealing normal coronary arteries with an EF in the 30-35% range. She does have symptoms of congestive heart failure as well, class II to class III, and left bundle-branch block.Since I saw her back in 3 months ago we have optimized her heart failure pharmacotherapy for her EF by 2-D echo performed 05/17/17 remained depressed at 30-35% I referred her to Dr. Crissie Sickles for consideration of IV ICD implantation for CRT which he did successfully on 06/26/17. She is scheduled for repeat 2-D echo next month. She does complain of symptoms of hypotension and my plan is to decrease her Entresto [place] from where it is currently to a lower dose.  HPI:  Monica Solis is a 57 y.o.     Current Meds  Medication Sig  . calcium carbonate (TUMS - DOSED IN MG ELEMENTAL  CALCIUM) 500 MG chewable tablet Chew 1 tablet by mouth daily as needed for indigestion or heartburn.  . carvedilol (COREG) 3.125 MG tablet Take 1 tablet (3.125 mg total) by mouth 2 (two) times daily.  . Cholecalciferol (VITAMIN D3) 2000 UNITS capsule Take 4,000 Units by mouth daily with supper.   . diclofenac sodium (VOLTAREN) 1 % GEL Apply 2 g topically 4 (four) times daily.   . fexofenadine (ALLEGRA) 180 MG tablet Take 180 mg by mouth daily as needed for allergies or rhinitis.  [name] gabapentin (NEURONTIN) 100 MG capsule Take 200 mg by mouth at bedtime.   . hydrochlorothiazide (HYDRODIURIL) 12.5 MG tablet Take 12.5 mg by mouth daily as needed (fluid).   . insulin aspart (NOVOLOG) 100 UNIT/ML injection Inject 7-10 Units into the skin 3 (three) times daily as needed for high blood sugar. Per SSC 7-10units when cbg above 120  . insulin detemir (LEVEMIR) 100 UNIT/ML injection Inject 34 Units into the skin at bedtime.   . Iron-Vitamins (GERITOL COMPLETE) TABS Take 1 tablet by mouth daily with supper.   . levothyroxine (SYNTHROID, LEVOTHROID) 75 MCG tablet Take 75 mcg by mouth daily before breakfast.   . Menthol, Topical Analgesic, (BENGAY EX) Apply 1 application topically 2 (two) times daily as needed (pain).  . Probiotic Product (PROBIOTIC DAILY) CAPS Take 1 capsule by mouth daily with supper.   . sacubitril-valsartan (ENTRESTO) 24-26 MG Take 1 tablet by mouth 2 (two) times daily.  . [DISCONTINUED] omeprazole (PRILOSEC) 40 MG capsule Take 40 mg by mouth at bedtime.  Allergies  Allergen Reactions  . Crestor [Rosuvastatin Calcium] Other (See Comments)    Muscle aches   . Other Swelling    Metal  . Benadryl [Diphenhydramine Hcl] Hives  . Invokana [Canagliflozin] Other (See Comments)    Loss of bladder control   . Metformin Other (See Comments)    Kidney damage  . Pravastatin Other (See Comments)    Muscle ache  . Tramadol Other (See Comments)    "nightmares"  . Victoza [Liraglutide] Other  (See Comments)    "Fainting"    Social History   Socioeconomic History  . Marital status: Married    Spouse name: Not on file  . Number of children: Not on file  . Years of education: Not on file  . Highest education level: Not on file  Occupational History  . Not on file  Social Needs  . Financial resource strain: Not on file  . Food insecurity:    Worry: Not on file    Inability: Not on file  . Transportation needs:    Medical: Not on file    Non-medical: Not on file  Tobacco Use  . Smoking status: Former Smoker    Packs/day: 0.10    Years: 2.00    Pack years: 0.20    Types: Cigarettes    Last attempt to quit: 10/02/1979    Years since quitting: 38.7  . Smokeless tobacco: Never Used  Substance and Sexual Activity  . Alcohol use: No    Alcohol/week: 0.0 standard drinks  . Drug use: No  . Sexual activity: Not Currently    Partners: Male    Birth control/protection: Surgical    Comment: 1st intercourse 32 yo-1 partner-BTL  Lifestyle  . Physical activity:    Days per week: Not on file    Minutes per session: Not on file  . Stress: Not on file  Relationships  . Social connections:    Talks on phone: Not on file    Gets together: Not on file    Attends religious service: Not on file    Active member of club or organization: Not on file    Attends meetings of clubs or organizations: Not on file    Relationship status: Not on file  . Intimate partner violence:    Fear of current or ex partner: Not on file    Emotionally abused: Not on file    Physically abused: Not on file    Forced sexual activity: Not on file  Other Topics Concern  . Not on file  Social History Narrative  . Not on file     Review of Systems: General: negative for chills, fever, night sweats or weight changes.  Cardiovascular: negative for chest pain, dyspnea on exertion, edema, orthopnea, palpitations, paroxysmal nocturnal dyspnea or shortness of breath Dermatological: negative for  rash Respiratory: negative for cough or wheezing Urologic: negative for hematuria Abdominal: negative for nausea, vomiting, diarrhea, bright red blood per rectum, melena, or hematemesis Neurologic: negative for visual changes, syncope, or dizziness All other systems reviewed and are otherwise negative except as noted above.    Blood pressure (!) 147/84, pulse 77, height 5\' 9"  (1.753 m), weight (!) 338 lb 12.8 oz (153.7 kg), last menstrual period 08/07/2016.  General appearance: alert and no distress Neck: no adenopathy, no carotid bruit, no JVD, supple, symmetrical, trachea midline and thyroid not enlarged, symmetric, no tenderness/mass/nodules Lungs: clear to auscultation bilaterally Heart: regular rate and rhythm, S1, S2 normal, no murmur, click, rub or  gallop Extremities: extremities normal, atraumatic, no cyanosis or edema Pulses: 2+ and symmetric Skin: Skin color, texture, turgor normal. No rashes or lesions Neurologic: Alert and oriented X 3, normal strength and tone. Normal symmetric reflexes. Normal coordination and gait  EKG not performed today  ASSESSMENT AND PLAN:   Essential hypertension History of essential hypertension with blood pressure measured to 147/84.  She is on carvedilol, hydrochlorothiazide and Entresto.  Hyperlipidemia History of hyperlipidemia not on statin therapy because of statin intolerance with an LDL of 124 measured on 04/02/2018.  She does have normal coronary arteries.  We talked about dietary modification and weight reduction prior to considering Repatha.  Non-ischemic cardiomyopathy ([place]) History of nonischemic cardiomyopathy status post cardiac catheterization by myself 01/07/2017 revealing normal coronaries with an EF of 30 to 35%.  She did have left bundle branch block and underwent ICD implantation/CRT by Dr. Crissie Sickles 06/26/2017 with improvement in her EF up to 50 to 55%.  She feels clinically improved.  She is on carvedilol and Entresto as  well.      Lorretta Harp MD FACP,FACC,FAHA, [facility] 06/11/2018 9:33 AM

## 2018-06-11 NOTE — Assessment & Plan Note (Signed)
History of hyperlipidemia not on statin therapy because of statin intolerance with an LDL of 124 measured on 04/02/2018.  She does have normal coronary arteries.  We talked about dietary modification and weight reduction prior to considering Repatha.

## 2018-06-11 NOTE — Addendum Note (Signed)
Addended by: Newt Minion on: 06/11/2018 11:10 AM   Modules accepted: Orders

## 2018-06-20 ENCOUNTER — Encounter: Payer: Self-pay | Admitting: Internal Medicine

## 2018-06-23 ENCOUNTER — Telehealth: Payer: Self-pay

## 2018-06-23 ENCOUNTER — Ambulatory Visit (INDEPENDENT_AMBULATORY_CARE_PROVIDER_SITE_OTHER): Payer: 59

## 2018-06-23 DIAGNOSIS — Z95810 Presence of automatic (implantable) cardiac defibrillator: Secondary | ICD-10-CM

## 2018-06-23 DIAGNOSIS — I5022 Chronic systolic (congestive) heart failure: Secondary | ICD-10-CM | POA: Diagnosis not present

## 2018-06-23 NOTE — Telephone Encounter (Signed)
Attempted to confirm remote transmission with pt. No answer and was unable to leave a message.   

## 2018-06-24 NOTE — Progress Notes (Signed)
EPIC Encounter for ICM Monitoring  Patient Name: Monica Solis is a 57 y.o. female Date: 06/24/2018 Primary Care Physican: Reynold Bowen, MD Primary Cardiologist:Berry Electrophysiologist:Taylor Dry Weight:338 lbs(weighs daily)         Heart Failure questions reviewed, pt asymptomatic.   HeartLogic Heart Failure Index is 0.  Index has not crossed the threshold suggesting it is normal.  No diuretic  Recommendations: No changes.   Encouraged to call for fluid symptoms.  Follow-up plan: ICM clinic phone appointment on 08/04/2018.  Office appointment with Dr Lovena Le 07/04/2018.        Copy of ICM check sent to Dr. Larae Grooms.    3 Month Trend   [name], RN 06/24/2018 12:31 PM

## 2018-06-29 DIAGNOSIS — G4733 Obstructive sleep apnea (adult) (pediatric): Secondary | ICD-10-CM | POA: Diagnosis not present

## 2018-07-02 DIAGNOSIS — E119 Type 2 diabetes mellitus without complications: Secondary | ICD-10-CM | POA: Diagnosis not present

## 2018-07-02 DIAGNOSIS — H35013 Changes in retinal vascular appearance, bilateral: Secondary | ICD-10-CM | POA: Diagnosis not present

## 2018-07-04 ENCOUNTER — Encounter: Payer: Self-pay | Admitting: Internal Medicine

## 2018-07-04 ENCOUNTER — Ambulatory Visit (INDEPENDENT_AMBULATORY_CARE_PROVIDER_SITE_OTHER): Payer: 59 | Admitting: Internal Medicine

## 2018-07-04 VITALS — BP 122/80 | HR 74 | Ht 69.0 in | Wt 340.0 lb

## 2018-07-04 DIAGNOSIS — I5022 Chronic systolic (congestive) heart failure: Secondary | ICD-10-CM

## 2018-07-04 DIAGNOSIS — Z95810 Presence of automatic (implantable) cardiac defibrillator: Secondary | ICD-10-CM

## 2018-07-04 DIAGNOSIS — I447 Left bundle-branch block, unspecified: Secondary | ICD-10-CM | POA: Diagnosis not present

## 2018-07-04 DIAGNOSIS — Z23 Encounter for immunization: Secondary | ICD-10-CM

## 2018-07-04 NOTE — Progress Notes (Signed)
HPI Monica Solis returns today for ongoing evaluation and management of her ICD, s/p BiV insertion. The patient feels better after her device was placed and has more energy. At times her bp has been a little low. No chest pain. She is frustrated by her inability to lose weight. Allergies  Allergen Reactions  . Crestor [Rosuvastatin Calcium] Other (See Comments)    Muscle aches   . Other Swelling    Metal  . Benadryl [Diphenhydramine Hcl] Hives  . Invokana [Canagliflozin] Other (See Comments)    Loss of bladder control   . Metformin Other (See Comments)    Kidney damage  . Pravastatin Other (See Comments)    Muscle ache  . Tramadol Other (See Comments)    "nightmares"  . Victoza [Liraglutide] Other (See Comments)    "Fainting"     Current Outpatient Medications  Medication Sig Dispense Refill  . calcium carbonate (TUMS - DOSED IN MG ELEMENTAL CALCIUM) 500 MG chewable tablet Chew 1 tablet by mouth daily as needed for indigestion or heartburn.    . carvedilol (COREG) 3.125 MG tablet Take 1 tablet (3.125 mg total) by mouth 2 (two) times daily. 180 tablet 3  . Cholecalciferol (VITAMIN D3) 2000 UNITS capsule Take 4,000 Units by mouth daily with supper.     . diclofenac sodium (VOLTAREN) 1 % GEL Apply 2 g topically 4 (four) times daily.     [name] esomeprazole (NEXIUM) 40 MG capsule Take by mouth daily.  5  . ezetimibe (ZETIA) 10 MG tablet Take 1 tablet (10 mg total) by mouth daily. 90 tablet 3  . fexofenadine (ALLEGRA) 180 MG tablet Take 180 mg by mouth daily as needed for allergies or rhinitis.    [name] gabapentin (NEURONTIN) 100 MG capsule Take 200 mg by mouth at bedtime.     . hydrochlorothiazide (HYDRODIURIL) 12.5 MG tablet Take 12.5 mg by mouth daily as needed (fluid).     . insulin aspart (NOVOLOG) 100 UNIT/ML injection Inject 7-10 Units into the skin 3 (three) times daily as needed for high blood sugar. Per SSC 7-10units when cbg above 120    . insulin detemir (LEVEMIR) 100 UNIT/ML  injection Inject 34 Units into the skin at bedtime.     . Iron-Vitamins (GERITOL COMPLETE) TABS Take 1 tablet by mouth daily with supper.     . levothyroxine (SYNTHROID, LEVOTHROID) 75 MCG tablet Take 75 mcg by mouth daily before breakfast.     . Menthol, Topical Analgesic, (BENGAY EX) Apply 1 application topically 2 (two) times daily as needed (pain).    . Probiotic Product (PROBIOTIC DAILY) CAPS Take 1 capsule by mouth daily with supper.     . sacubitril-valsartan (ENTRESTO) 24-26 MG Take 1 tablet by mouth 2 (two) times daily. 180 tablet 3   No current facility-administered medications for this visit.    Facility-Administered Medications Ordered in Other Visits  Medication Dose Route Frequency Provider Last Rate Last Dose  . technetium tetrofosmin (TC-MYOVIEW) injection 03.0 millicurie  09.2 millicurie Intravenous Once PRN Hilty, Nadean Corwin, MD         Past Medical History:  Diagnosis Date  . Abnormal uterine bleeding (AUB)   . AICD (automatic cardioverter/defibrillator) present   . At risk for sleep apnea    STOP-BANG=  5       SENT TO PCP 10-01-2016  . Blood clot in vein    "superficial blood clots in BLE; I had 6-7 since 2000" (06/26/2017)  . BMI 45.0-49.9,  adult [facility])   . Chronic bronchitis ([place])   . Diverticulosis of colon   . Endometrial polyp   . Factor 5 Leiden mutation, heterozygous ([place])    dx 2011  . GERD (gastroesophageal reflux disease)   . High cholesterol   . History of DVT of lower extremity 2000; 2011   "I've had them in both"  . History of ectopic pregnancy 1998  . Hypertension   . Hypothyroidism   . Left bundle branch block (LBBB)   . Osteoarthritis    "all over" (06/26/2017)  . Peripheral neuropathy   . PONV (postoperative nausea and vomiting)   . Type 2 diabetes mellitus treated with insulin (H[facility])     ROS:   All systems reviewed and negative except as noted in the HPI.   Past Surgical History:  Procedure Laterality Date  . BI-VENTRICULAR  IMPLANTABLE CARDIOVERTER DEFIBRILLATOR  (CRT-D)  06/26/2017  . BIV ICD INSERTION CRT-D N/A 06/26/2017   Procedure: BIV ICD INSERTION CRT-D;  Surgeon: Evans Lance, MD;  Location: [place] [facility];  Service: Cardiovascular;  Laterality: N/A;  . COLONOSCOPY  2013  . DILATATION & CURETTAGE/HYSTEROSCOPY WITH MYOSURE N/A 10/05/2016   Procedure: Gregory;  Surgeon: Anastasio Auerbach, MD;  Location: [place];  Service: Gynecology;  Laterality: N/A;  requests to follow 7:30am caserequests one hour OR time  . DILATION AND CURETTAGE OF UTERUS  12/06/2007  . LAPAROSCOPY BILATERAL TUBAL LIGATION WITH FALOPE RINGS/  D&C HYSTEROSCOPY WITH POLYPECTOMY  12/06/2007   and Lysis Adhesions  . LEFT HEART CATH AND CORONARY ANGIOGRAPHY N/A 01/03/2017   Procedure: Left Heart Cath and Coronary Angiography;  Surgeon: Lorretta Harp, MD;  Location: [place] [facility];  Service: Cardiovascular;  Laterality: N/A;  . TUBAL LIGATION  12/06/2007     Family History  Problem Relation Age of Onset  . Hypertension Mother   . Heart disease Mother   . Alzheimer's disease Mother   . Heart disease Father   . Breast cancer Maternal Aunt        30's  . Alzheimer's disease Maternal Aunt   . Diabetes Maternal Grandmother   . Heart disease Maternal Grandmother   . Heart disease Paternal Grandmother   . Cancer Maternal Grandfather        Unknown type  . Mitral valve prolapse Sister   . Heart disease Paternal Grandfather      Social History   Socioeconomic History  . Marital status: Married    Spouse name: Not on file  . Number of children: Not on file  . Years of education: Not on file  . Highest education level: Not on file  Occupational History  . Not on file  Social Needs  . Financial resource strain: Not on file  . Food insecurity:    Worry: Not on file    Inability: Not on file  . Transportation needs:    Medical: Not on file    Non-medical:  Not on file  Tobacco Use  . Smoking status: Former Smoker    Packs/day: 0.10    Years: 2.00    Pack years: 0.20    Types: Cigarettes    Last attempt to quit: 10/02/1979    Years since quitting: 38.7  . Smokeless tobacco: Never Used  Substance and Sexual Activity  . Alcohol use: No    Alcohol/week: 0.0 standard drinks  . Drug use: No  . Sexual activity: Not Currently  Partners: Male    Birth control/protection: Surgical    Comment: 1st intercourse 32 yo-1 partner-BTL  Lifestyle  . Physical activity:    Days per week: Not on file    Minutes per session: Not on file  . Stress: Not on file  Relationships  . Social connections:    Talks on phone: Not on file    Gets together: Not on file    Attends religious service: Not on file    Active member of club or organization: Not on file    Attends meetings of clubs or organizations: Not on file    Relationship status: Not on file  . Intimate partner violence:    Fear of current or ex partner: Not on file    Emotionally abused: Not on file    Physically abused: Not on file    Forced sexual activity: Not on file  Other Topics Concern  . Not on file  Social History Narrative  . Not on file     BP 122/80   Pulse 74   Ht 5\' 9"  (1.753 m)   Wt (!) 340 lb (154.2 kg)   LMP 08/07/2016   SpO2 96%   BMI 50.21 kg/m   Physical Exam:  Morbidly obese appearing woman, NAD HEENT: Unremarkable Neck:  6 cm JVD, no thyromegally Lymphatics:  No adenopathy Back:  No CVA tenderness Lungs:  Clear with no wheezes HEART:  Regular rate rhythm, no murmurs, no rubs, no clicks Abd:  soft, obese, positive bowel sounds, no organomegally, no rebound, no guarding Ext:  2 plus pulses, no edema, no cyanosis, no clubbing Skin:  No rashes no nodules Neuro:  CN II through XII intact, motor grossly intact  EKG - nsr with biv pacing  DEVICE  Normal device function.  See PaceArt for details.   Assess/Plan: 1. Chronic systolic heart failure - she  is class 2. She is limited by her mobility. SHe is encouraged to lose weight and increase her physical activity. No change in meds. 2. ICD - her [place] sci biv device is working normally. We will recheck in several months. 3. Obesity - she is pending a consult with a nutritionist. We will follow.  Mikle Bosworth.D.

## 2018-07-04 NOTE — Patient Instructions (Signed)
Medication Instructions:  Your physician recommends that you continue on your current medications as directed. Please refer to the Current Medication list given to you today.  Labwork: None ordered.  Testing/Procedures: None ordered.  Follow-Up: Your physician wants you to follow-up in: one year with Dr. Lovena Le.   You will receive a reminder letter in the mail two months in advance. If you don't receive a letter, please call our office to schedule the follow-up appointment.  Remote monitoring is used to monitor your ICD from home. This monitoring reduces the number of office visits required to check your device to one time per year. It allows [place] to keep an eye on the functioning of your device to ensure it is working properly. You are scheduled for a device check from home on 07/07/2018. You may send your transmission at any time that day. If you have a wireless device, the transmission will be sent automatically. After your physician reviews your transmission, you will receive a postcard with your next transmission date.  Any Other Special Instructions Will Be Listed Below (If Applicable).  If you need a refill on your cardiac medications before your next appointment, please call your pharmacy.

## 2018-07-07 ENCOUNTER — Encounter: Payer: Self-pay | Admitting: Skilled Nursing Facility1

## 2018-07-07 ENCOUNTER — Telehealth: Payer: Self-pay | Admitting: Cardiology

## 2018-07-07 ENCOUNTER — Encounter: Payer: 59 | Attending: Cardiovascular Disease | Admitting: Skilled Nursing Facility1

## 2018-07-07 ENCOUNTER — Ambulatory Visit (INDEPENDENT_AMBULATORY_CARE_PROVIDER_SITE_OTHER): Payer: 59 | Admitting: *Deleted

## 2018-07-07 DIAGNOSIS — Z6841 Body Mass Index (BMI) 40.0 and over, adult: Secondary | ICD-10-CM | POA: Diagnosis not present

## 2018-07-07 DIAGNOSIS — I428 Other cardiomyopathies: Secondary | ICD-10-CM | POA: Diagnosis not present

## 2018-07-07 DIAGNOSIS — Z713 Dietary counseling and surveillance: Secondary | ICD-10-CM | POA: Insufficient documentation

## 2018-07-07 DIAGNOSIS — E669 Obesity, unspecified: Secondary | ICD-10-CM | POA: Diagnosis not present

## 2018-07-07 DIAGNOSIS — I10 Essential (primary) hypertension: Secondary | ICD-10-CM | POA: Diagnosis not present

## 2018-07-07 DIAGNOSIS — E119 Type 2 diabetes mellitus without complications: Secondary | ICD-10-CM

## 2018-07-07 NOTE — Patient Instructions (Addendum)
-  Get a surge protector so you can move your Wii to the living room  -Play your Wii 2 times a week; the game does not matter as long as you are standing while you do it (aim for 10 minutes at a time)  - Eat every 5 hours  -Write down everything you put in your mouth: drink and food starting tomorrow: write down how much and as specific as possible for example:a sandwich with 2 slices of white bread, 2 slices of ham, 1 slice of cheese, and both sides with mustard and water to drink

## 2018-07-07 NOTE — Progress Notes (Signed)
  Assessment:  Primary concerns today: weight management.   Pt states she is here to lose weight. Pt states she has diverticulitis stating due to flare ups she avoids specific foods: raisins, blueberries, corn, onions, peppers, greasy, spicy, spagetti, cabbage, broccoli, brussel sprouts. Pt states she has a intolerance to wheat products causing bloat and diarrhea. Pt states she does not eat sugar substitutes. Pt states she cannot eat anything hard of crunchy due to false teeth including apples. Pt states she has had diabetes since 2011. Pt states she checks her blood sugar 4 times a day: fasting: 146, 160, 155; throughout the day: 125, 110, 108, 120, 130, and 180-200.  Pt states she stays tired a lot. Pt states she does have anger if she waits too long to eat. Pt state if she overeats she gets heartburn so she avoids that behavior. Pt states she has insomnia and wears a C-PAP. Pt states she sleeps in a separate room from her husband due to both of their sleep apnea. Pt states she has about 4 hours average of sleep. Pt states she does not eat anything past 8pm. Pt states she does not think her portions have caused her to gain weight.  Pt is not currently in a place to try foods she thinks of as making her diverticulitis flare up but she may be open to adding them in slowly over time.  MEDICATIONS: See List   DIETARY INTAKE:  Usual eating pattern includes 3 meals and 2 snacks per day.  Everyday foods include none stated.  Avoided foods include mentioned above.    24-hr recall:  B ( AM): egg sandwich and 2 bacon  Snk ( AM): yogurt  L ( PM): sandwich or salad with crackers and banana  Snk ( PM): cheesits  D ( PM): salad and meat Snk ( PM):  Beverages: coffee, unsweet tea, water  Usual physical activity: ADL's  Estimated energy needs: 1500 calories 170 g carbohydrates 112 g protein 42 g fat  Progress Towards Goal(s):  In progress    Intervention:  Nutrition cousneling.  Goals: -Get a  surge protector so you can move your Wii to the living room -Play your Wii 2 times a week; the game does not matter as long as you are standing while you do it (aim for 10 minutes at a time) - Eat every 5 hours -Write down everything you put in your mouth: drink and food starting tomorrow: write down how much and as specific as possible for example:a sandwich with 2 slices of white bread, 2 slices of ham, 1 slice of cheese, and both sides with mustard and water to drink    Teaching Method Utilized: Visual Auditory Hands on  Barriers to learning/adherence to lifestyle change:   Demonstrated degree of understanding via:  Teach Back   Monitoring/Evaluation:  Dietary intake, exercise, and body weight prn.

## 2018-07-07 NOTE — Telephone Encounter (Signed)
LMOVM reminding pt to send remote transmission.   

## 2018-07-08 ENCOUNTER — Encounter: Payer: Self-pay | Admitting: Cardiology

## 2018-07-08 NOTE — Progress Notes (Signed)
Remote ICD transmission.   

## 2018-07-16 LAB — CUP PACEART REMOTE DEVICE CHECK
Implantable Lead Implant Date: 20180912
Implantable Lead Location: 753858
Implantable Lead Location: 753859
Implantable Lead Model: 293
Implantable Lead Model: 7741
Implantable Lead Serial Number: 435131
Implantable Lead Serial Number: 920623
Implantable Pulse Generator Implant Date: 20180912
MDC IDC LEAD IMPLANT DT: 20180912
MDC IDC LEAD IMPLANT DT: 20180912
MDC IDC LEAD LOCATION: 753860
MDC IDC LEAD SERIAL: 813037
MDC IDC SESS DTM: 20191002163416
Pulse Gen Serial Number: 193993

## 2018-07-29 DIAGNOSIS — G4733 Obstructive sleep apnea (adult) (pediatric): Secondary | ICD-10-CM | POA: Diagnosis not present

## 2018-08-04 DIAGNOSIS — G4733 Obstructive sleep apnea (adult) (pediatric): Secondary | ICD-10-CM | POA: Diagnosis not present

## 2018-08-06 ENCOUNTER — Ambulatory Visit: Payer: 59 | Admitting: Skilled Nursing Facility1

## 2018-08-19 DIAGNOSIS — Z86711 Personal history of pulmonary embolism: Secondary | ICD-10-CM | POA: Diagnosis not present

## 2018-08-19 DIAGNOSIS — E1151 Type 2 diabetes mellitus with diabetic peripheral angiopathy without gangrene: Secondary | ICD-10-CM | POA: Diagnosis not present

## 2018-08-19 DIAGNOSIS — E1142 Type 2 diabetes mellitus with diabetic polyneuropathy: Secondary | ICD-10-CM | POA: Diagnosis not present

## 2018-08-25 ENCOUNTER — Ambulatory Visit (INDEPENDENT_AMBULATORY_CARE_PROVIDER_SITE_OTHER): Payer: 59

## 2018-08-25 ENCOUNTER — Telehealth: Payer: Self-pay | Admitting: Cardiology

## 2018-08-25 DIAGNOSIS — I5022 Chronic systolic (congestive) heart failure: Secondary | ICD-10-CM | POA: Diagnosis not present

## 2018-08-25 DIAGNOSIS — Z95810 Presence of automatic (implantable) cardiac defibrillator: Secondary | ICD-10-CM

## 2018-08-25 NOTE — Telephone Encounter (Signed)
Spoke with pt and reminded pt of remote transmission that is due today. Pt verbalized understanding.   

## 2018-08-28 NOTE — Progress Notes (Signed)
EPIC Encounter for ICM Monitoring  Patient Name: Monica Solis is a 57 y.o. female Date: 08/28/2018 Primary Care Physican: Reynold Bowen, MD Primary Cardiologist:Berry Electrophysiologist:Taylor Last [id number]:009 lbs(weighs daily)   Today's Weight:  330.6 lbs             Heart Failure questions reviewed, pt asymptomatic.   HeartLogic Heart Failure Index is 6.  Index has not crossed the threshold suggesting normal.  No diuretic  Recommendations: No changes.   Encouraged to call for fluid symptoms.  Follow-up plan: ICM clinic phone appointment on 10/06/2018.          Copy of ICM check sent to Dr. Lovena Le.    3 Month Trend    8 Day Data Trend            [name], RN 08/28/2018 9:15 AM

## 2018-09-18 ENCOUNTER — Encounter: Payer: 59 | Admitting: Gynecology

## 2018-10-06 ENCOUNTER — Ambulatory Visit (INDEPENDENT_AMBULATORY_CARE_PROVIDER_SITE_OTHER): Payer: 59

## 2018-10-06 DIAGNOSIS — Z95810 Presence of automatic (implantable) cardiac defibrillator: Secondary | ICD-10-CM

## 2018-10-06 DIAGNOSIS — I5022 Chronic systolic (congestive) heart failure: Secondary | ICD-10-CM | POA: Diagnosis not present

## 2018-10-06 DIAGNOSIS — I428 Other cardiomyopathies: Secondary | ICD-10-CM

## 2018-10-06 NOTE — Progress Notes (Signed)
Remote ICD transmission.   

## 2018-10-06 NOTE — Progress Notes (Signed)
EPIC Encounter for ICM Monitoring  Patient Name: Monica Solis is a 57 y.o. female Date: 10/06/2018 Primary Care Physican: Reynold Bowen, MD Primary Cardiologist:Berry Electrophysiologist:Taylor Last Weight:330lbs(weighs daily) Today's Weight: 330 lbs                                                                    Heart Failure questions reviewed, pt asymptomatic.  Patient will be moving soon and call back when she has new phone number and address.    HeartLogic Heart Failure Index is 0.  Index has not crossed the threshold suggesting normal.  No diuretic  Recommendations: No changes.   Encouraged to call for fluid symptoms.  Follow-up plan: ICM clinic phone appointment on 11/06/2018.          Copy of ICM check sent to Dr. Lovena Le.       3 Month Trend      8 Day Data Trend            [name], RN 10/06/2018 4:57 PM

## 2018-10-07 LAB — CUP PACEART REMOTE DEVICE CHECK
Date Time Interrogation Session: 20191224111052
Implantable Lead Implant Date: 20180912
Implantable Lead Implant Date: 20180912
Implantable Lead Location: 753858
Implantable Lead Model: 293
Implantable Lead Model: 7741
Implantable Lead Serial Number: 435131
MDC IDC LEAD IMPLANT DT: 20180912
MDC IDC LEAD LOCATION: 753859
MDC IDC LEAD LOCATION: 753860
MDC IDC LEAD SERIAL: 813037
MDC IDC LEAD SERIAL: 920623
MDC IDC PG IMPLANT DT: 20180912
Pulse Gen Serial Number: 193993

## 2018-11-06 DIAGNOSIS — G4733 Obstructive sleep apnea (adult) (pediatric): Secondary | ICD-10-CM | POA: Diagnosis not present

## 2018-11-17 NOTE — Progress Notes (Signed)
No ICM remote transmission received for 11/06/2018 and next ICM transmission scheduled for 11/24/2018.

## 2018-11-24 ENCOUNTER — Ambulatory Visit (INDEPENDENT_AMBULATORY_CARE_PROVIDER_SITE_OTHER): Payer: 59

## 2018-11-24 DIAGNOSIS — I5022 Chronic systolic (congestive) heart failure: Secondary | ICD-10-CM | POA: Diagnosis not present

## 2018-11-24 DIAGNOSIS — Z95810 Presence of automatic (implantable) cardiac defibrillator: Secondary | ICD-10-CM | POA: Diagnosis not present

## 2018-11-24 NOTE — Progress Notes (Signed)
EPIC Encounter for ICM Monitoring  Patient Name: Monica Solis is a 58 y.o. female Date: 11/24/2018 Primary Care Physican: Reynold Bowen, MD Primary Cardiologist:Berry Electrophysiologist:Taylor LastWeight:330lbs(weighs daily) Today's Weight: unknown  Attempted call to patient and unable to reach.  Left detailed message per DPR regarding transmission. Transmission reviewed. .    HeartLogic Heart Failure Index is0. Index has not crossed the threshold suggestingnormal.  No diuretic  Recommendations:No changes. Encouraged to call for fluid symptoms.  Follow-up plan: ICM clinic phone appointment on3/24/2020.   Copy of ICM check sent to Dr. Lovena Le.              [name], RN 11/24/2018 8:34 AM

## 2018-12-11 ENCOUNTER — Ambulatory Visit (INDEPENDENT_AMBULATORY_CARE_PROVIDER_SITE_OTHER): Payer: 59 | Admitting: Cardiology

## 2018-12-11 ENCOUNTER — Ambulatory Visit: Payer: 59 | Admitting: Cardiology

## 2018-12-11 ENCOUNTER — Encounter: Payer: Self-pay | Admitting: Cardiology

## 2018-12-11 VITALS — BP 138/86 | HR 71 | Ht 69.0 in | Wt 335.8 lb

## 2018-12-11 DIAGNOSIS — I10 Essential (primary) hypertension: Secondary | ICD-10-CM | POA: Diagnosis not present

## 2018-12-11 DIAGNOSIS — IMO0001 Reserved for inherently not codable concepts without codable children: Secondary | ICD-10-CM

## 2018-12-11 DIAGNOSIS — G4730 Sleep apnea, unspecified: Secondary | ICD-10-CM | POA: Diagnosis not present

## 2018-12-11 DIAGNOSIS — Z0389 Encounter for observation for other suspected diseases and conditions ruled out: Secondary | ICD-10-CM | POA: Diagnosis not present

## 2018-12-11 DIAGNOSIS — I428 Other cardiomyopathies: Secondary | ICD-10-CM | POA: Diagnosis not present

## 2018-12-11 DIAGNOSIS — Z95810 Presence of automatic (implantable) cardiac defibrillator: Secondary | ICD-10-CM

## 2018-12-11 MED ORDER — SACUBITRIL-VALSARTAN 24-26 MG PO TABS: 1.0000 | ORAL_TABLET | Freq: Two times a day (BID) | ORAL | 3 refills | Status: DC

## 2018-12-11 MED ORDER — CARVEDILOL 3.125 MG PO TABS: 3.1250 mg | ORAL_TABLET | Freq: Two times a day (BID) | ORAL | 3 refills | Status: DC

## 2018-12-11 NOTE — Assessment & Plan Note (Signed)
Normal coronaries at cath March 2018

## 2018-12-11 NOTE — Assessment & Plan Note (Signed)
BMI 49- Pt was originally evaluated for pre op clearance for bariatric surgery which the pt says is now off the table

## 2018-12-11 NOTE — Assessment & Plan Note (Signed)
!  32/88 by me- she says 120/80 at home

## 2018-12-11 NOTE — Assessment & Plan Note (Signed)
BS BiV ICD implanted Sept 2018

## 2018-12-11 NOTE — Assessment & Plan Note (Signed)
Reports compliance with C-pap 

## 2018-12-11 NOTE — Progress Notes (Signed)
12/11/2018 Monica Solis   1961/09/18  [id number]  Primary Physician Reynold Bowen, MD Primary Cardiologist: Dr Gwenlyn Found- Dr Lovena Le EP  HPI:  58 y/o female seen by Dr Gwenlyn Found for pre op clearance (bariatric surgery) in Jan 2018.  She had risk factors for CAD and a LBBB on her EKG.  Myoview Feb 2018 was abnormal with an EF of 35%. The pt also gave a history of chest pain. Diagnostic cath was done March 2018 and revealed normal coronaries. She was placed on medications and a f/u echo was done in Aug 2018. This revealed no change in her EF-30-35%. She was seen by Dr Lovena Le and underwent implantation of a [employer] V ICD in Sept 2018. Her most recent echo March 2019 showed her EF to be 55-60%.  She has had problems tolerating her medications secondary to hypotension and her medications had to be adjusted.  She last saw Dr Gwenlyn Found in Aug 2019- Dr Lovena Le Sept 2019.  She improved symptomatically after her Biv ICD. She has morbid obesity and hasn't been able to loose weight.  We referred her to a nutritionist but she was in the process of moving and couldn't keep the appointments. She is ready to try again.    Current Outpatient Medications  Medication Sig Dispense Refill  . calcium carbonate (TUMS - DOSED IN MG ELEMENTAL CALCIUM) 500 MG chewable tablet Chew 1 tablet by mouth daily as needed for indigestion or heartburn.    . Cholecalciferol (VITAMIN D3) 2000 UNITS capsule Take 4,000 Units by mouth daily with supper.     . diclofenac sodium (VOLTAREN) 1 % GEL Apply 2 g topically 4 (four) times daily.     [name] esomeprazole (NEXIUM) 40 MG capsule Take by mouth daily.  5  . fexofenadine (ALLEGRA) 180 MG tablet Take 180 mg by mouth daily as needed for allergies or rhinitis.    [name] gabapentin (NEURONTIN) 100 MG capsule Take 200 mg by mouth at bedtime.     . hydrochlorothiazide (HYDRODIURIL) 12.5 MG tablet Take 12.5 mg by mouth daily as needed (fluid).     . insulin aspart (NOVOLOG) 100 UNIT/ML injection  Inject 7-10 Units into the skin 3 (three) times daily as needed for high blood sugar. Per SSC 7-10units when cbg above 120    . insulin detemir (LEVEMIR) 100 UNIT/ML injection Inject 34 Units into the skin at bedtime.     . Iron-Vitamins (GERITOL COMPLETE) TABS Take 1 tablet by mouth daily with supper.     . levothyroxine (SYNTHROID, LEVOTHROID) 75 MCG tablet Take 75 mcg by mouth daily before breakfast.     . Menthol, Topical Analgesic, (BENGAY EX) Apply 1 application topically 2 (two) times daily as needed (pain).    . Multiple Vitamins-Minerals (MULTIVITAMIN ADULT PO) Take by mouth.    . Probiotic Product (PROBIOTIC DAILY) CAPS Take 1 capsule by mouth daily with supper.     . sacubitril-valsartan (ENTRESTO) 24-26 MG Take 1 tablet by mouth 2 (two) times daily. 180 tablet 3  . carvedilol (COREG) 3.125 MG tablet Take 1 tablet (3.125 mg total) by mouth 2 (two) times daily. 180 tablet 3   No current facility-administered medications for this visit.    Facility-Administered Medications Ordered in Other Visits  Medication Dose Route Frequency Provider Last Rate Last Dose  . technetium tetrofosmin (TC-MYOVIEW) injection 01.6 millicurie  01.0 millicurie Intravenous Once PRN Hilty, Nadean Corwin, MD        Allergies  Allergen Reactions  . Crestor [  Rosuvastatin Calcium] Other (See Comments)    Muscle aches   . Other Swelling    Metal  . Benadryl [Diphenhydramine Hcl] Hives  . Canagliflozin Other (See Comments)    Loss of bladder control  Loss of bladder control Dysuria Dysuria  . Liraglutide Other (See Comments)    "Fainting" "Fainting" Hypotension,   . Metformin Other (See Comments)    Kidney damage Kidney damage Kidney damage Decreased kidney function  . Pravastatin Other (See Comments) and Rash    Muscle ache Muscle ache Muscle ache  . Tramadol Other (See Comments) and Rash    "nightmares" "nightmares" constipation    Past Medical History:  Diagnosis Date  . Abnormal uterine  bleeding (AUB)   . AICD (automatic cardioverter/defibrillator) present   . At risk for sleep apnea    STOP-BANG=  5       SENT TO PCP 10-01-2016  . Blood clot in vein    "superficial blood clots in BLE; I had 6-7 since 2000" (06/26/2017)  . BMI 45.0-49.9, adult ([place])   . Chronic bronchitis ([place])   . Diverticulosis of colon   . Endometrial polyp   . Factor 5 Leiden mutation, heterozygous ([place])    dx 2011  . GERD (gastroesophageal reflux disease)   . High cholesterol   . History of DVT of lower extremity 2000; 2011   "I've had them in both"  . History of ectopic pregnancy 1998  . Hypertension   . Hypothyroidism   . Left bundle branch block (LBBB)   . Osteoarthritis    "all over" (06/26/2017)  . Peripheral neuropathy   . PONV (postoperative nausea and vomiting)   . Type 2 diabetes mellitus treated with insulin ([facility])     Social History   Socioeconomic History  . Marital status: Married    Spouse name: Not on file  . Number of children: Not on file  . Years of education: Not on file  . Highest education level: Not on file  Occupational History  . Not on file  Social Needs  . Financial resource strain: Not on file  . Food insecurity:    Worry: Not on file    Inability: Not on file  . Transportation needs:    Medical: Not on file    Non-medical: Not on file  Tobacco Use  . Smoking status: Former Smoker    Packs/day: 0.10    Years: 2.00    Pack years: 0.20    Types: Cigarettes    Last attempt to quit: 10/02/1979    Years since quitting: 39.2  . Smokeless tobacco: Never Used  Substance and Sexual Activity  . Alcohol use: No    Alcohol/week: 0.0 standard drinks  . Drug use: No  . Sexual activity: Not Currently    Partners: Male    Birth control/protection: Surgical    Comment: 1st intercourse 32 yo-1 partner-BTL  Lifestyle  . Physical activity:    Days per week: Not on file    Minutes per session: Not on file  . Stress: Not on file  Relationships  . Social  connections:    Talks on phone: Not on file    Gets together: Not on file    Attends religious service: Not on file    Active member of club or organization: Not on file    Attends meetings of clubs or organizations: Not on file    Relationship status: Not on file  . Intimate partner violence:    Fear  of current or ex partner: Not on file    Emotionally abused: Not on file    Physically abused: Not on file    Forced sexual activity: Not on file  Other Topics Concern  . Not on file  Social History Narrative  . Not on file     Family History  Problem Relation Age of Onset  . Hypertension Mother   . Heart disease Mother   . Alzheimer's disease Mother   . Heart disease Father   . Breast cancer Maternal Aunt        30's  . Alzheimer's disease Maternal Aunt   . Diabetes Maternal Grandmother   . Heart disease Maternal Grandmother   . Heart disease Paternal Grandmother   . Cancer Maternal Grandfather        Unknown type  . Mitral valve prolapse Sister   . Heart disease Paternal Grandfather      Review of Systems: General: negative for chills, fever, night sweats or weight changes.  Cardiovascular: negative for chest pain, edema, orthopnea, palpitations, paroxysmal nocturnal dyspnea or shortness of breath Dermatological: negative for rash Respiratory: negative for cough or wheezing Urologic: negative for hematuria Abdominal: negative for nausea, vomiting, diarrhea, bright red blood per rectum, melena, or hematemesis Neurologic: negative for visual changes, syncope, or dizziness All other systems reviewed and are otherwise negative except as noted above.    Blood pressure 138/86, pulse 71, height 5\' 9"  (1.753 m), weight (!) 335 lb 12.8 oz (152.3 kg), last menstrual period 08/07/2016, SpO2 99 %.  General appearance: alert, cooperative, icteric and morbidly obese Neck: no adenopathy, no carotid bruit, no JVD, supple, symmetrical, trachea midline and thyroid not enlarged,  symmetric, no tenderness/mass/nodules Lungs: clear to auscultation bilaterally Heart: regular rate and rhythm Extremities: no edema Skin: pale, warm and dry Neurologic: Grossly normal  EKG A sensed- V paced  ASSESSMENT AND PLAN:   Non-ischemic cardiomyopathy ([place]) EF 30-35% March 2018- Aug 2018- EF improved to 55% by echo March 2019 after Biv ICD in Sept 2018  Normal coronary arteries Normal coronaries at cath March 2018  OSA-on C-pap Reports compliance with C-pap  Obesity BMI 49- Pt was originally evaluated for pre op clearance for bariatric surgery which the pt says is now off the table  ICD (implantable cardioverter-defibrillator) in place BS BiV ICD implanted Sept 2018  Essential hypertension !32/88 by me- she says 120/80 at home   PLAN  I encouraged her to try taking the Zetia- she has filled the Rx but was afraid she would have myalgias during her move.  I encouraged her to get back with her nutritionist and work on weight loss.  She'll monitor her B/P at home and let us know if her systolic B/P is running > 235 or diastolic > 85.   Kerin Ransom PA-C 12/11/2018 2:30 PM

## 2018-12-11 NOTE — Patient Instructions (Signed)
Medication Instructions:  Your physician recommends that you continue on your current medications as directed. Please refer to the Current Medication list given to you today. If you need a refill on your cardiac medications before your next appointment, please call your pharmacy.   Lab work: None  If you have labs (blood work) drawn today and your tests are completely normal, you will receive your results only by: [name] MyChart Message (if you have MyChart) OR . A paper copy in the mail If you have any lab test that is abnormal or we need to change your treatment, we will call you to review the results.  Testing/Procedures: CONTACT YOUR NUTRITIONIST  Follow-Up: At [facility], you and your health needs are our priority.  As part of our continuing mission to provide you with exceptional heart care, we have created designated Provider Care Teams.  These Care Teams include your primary Cardiologist (physician) and Advanced Practice Providers (APPs -  Physician Assistants and Nurse Practitioners) who all work together to provide you with the care you need, when you need it. You will need a follow up appointment in 6 months.  Please call our office 2 months in advance to schedule this appointment.  You may see Dr Quay Burow or one of the following Advanced Practice Providers on your designated Care Team:   Kerin Ransom, PA-C Roby Lofts, [place] . Sande Rives, PA-C  Any Other Special Instructions Will Be Listed Below (If Applicable).

## 2018-12-11 NOTE — Assessment & Plan Note (Signed)
EF 30-35% March 2018- Aug 2018- EF improved to 55% by echo March 2019 after Biv ICD in Sept 2018

## 2018-12-12 ENCOUNTER — Telehealth: Payer: Self-pay | Admitting: Cardiology

## 2018-12-12 MED ORDER — SACUBITRIL-VALSARTAN 24-26 MG PO TABS: 1.0000 | ORAL_TABLET | Freq: Two times a day (BID) | ORAL | 3 refills | Status: DC

## 2018-12-12 MED ORDER — CARVEDILOL 3.125 MG PO TABS: 3.1250 mg | ORAL_TABLET | Freq: Two times a day (BID) | ORAL | 3 refills | Status: DC

## 2018-12-12 NOTE — Telephone Encounter (Signed)
Pt advised her RX request was sent in to [facility] in [place].

## 2018-12-12 NOTE — Telephone Encounter (Signed)
Per pt call -  Stated please call in medications COREG & ENTRESTAL called in to [facility] [place] [phone].  Please give her a call when done.

## 2018-12-18 ENCOUNTER — Encounter: Payer: 59 | Admitting: Gynecology

## 2018-12-18 DIAGNOSIS — E1151 Type 2 diabetes mellitus with diabetic peripheral angiopathy without gangrene: Secondary | ICD-10-CM | POA: Diagnosis not present

## 2018-12-18 DIAGNOSIS — E7849 Other hyperlipidemia: Secondary | ICD-10-CM | POA: Diagnosis not present

## 2018-12-18 DIAGNOSIS — E1142 Type 2 diabetes mellitus with diabetic polyneuropathy: Secondary | ICD-10-CM | POA: Diagnosis not present

## 2018-12-18 DIAGNOSIS — I5022 Chronic systolic (congestive) heart failure: Secondary | ICD-10-CM | POA: Diagnosis not present

## 2018-12-18 DIAGNOSIS — K5730 Diverticulosis of large intestine without perforation or abscess without bleeding: Secondary | ICD-10-CM | POA: Diagnosis not present

## 2018-12-23 ENCOUNTER — Ambulatory Visit (INDEPENDENT_AMBULATORY_CARE_PROVIDER_SITE_OTHER): Payer: 59 | Admitting: Gynecology

## 2018-12-23 ENCOUNTER — Encounter: Payer: Self-pay | Admitting: Gynecology

## 2018-12-23 VITALS — BP 124/84 | Ht 68.0 in | Wt 337.0 lb

## 2018-12-23 DIAGNOSIS — Z01419 Encounter for gynecological examination (general) (routine) without abnormal findings: Secondary | ICD-10-CM | POA: Diagnosis not present

## 2018-12-23 DIAGNOSIS — Z1151 Encounter for screening for human papillomavirus (HPV): Secondary | ICD-10-CM

## 2018-12-23 NOTE — Patient Instructions (Signed)
Schedule your mammogram;

## 2018-12-23 NOTE — Progress Notes (Signed)
    Monica Solis 58 [id number]        58 y.o.  G1P0010 for annual gynecologic exam.  Without gynecologic complaints.  Had negative sonohysterogram with endometrial biopsy 2019 for episode of postmenopausal bleeding.  Has done no bleeding since  Past medical history,surgical history, problem list, medications, allergies, family history and social history were all reviewed and documented as reviewed in the EPIC chart.  ROS:  Performed with pertinent positives and negatives included in the history, assessment and plan.   Additional significant findings : None   Exam: [name] assistant Vitals:   12/23/18 0945  BP: 124/84  Weight: (!) 337 lb (152.9 kg)  Height: 5\' 8"  (1.727 m)   Body mass index is 51.24 kg/m.  General appearance:  Normal affect, orientation and appearance. Skin: Grossly normal HEENT: Without gross lesions.  No cervical or supraclavicular adenopathy. Thyroid normal.  Lungs:  Clear without wheezing, rales or rhonchi Cardiac: RR, without RMG Abdominal:  Soft, nontender, without masses, guarding, rebound, organomegaly or hernia Breasts:  Examined lying and sitting without masses, retractions, discharge or axillary adenopathy. Pelvic:  Ext, BUS, Vagina: Normal with atrophic changes  Cervix: Normal.  Pap smear/HPV  Uterus: Unable to palpate but no gross masses or tenderness,   Adnexa: Without masses or tenderness    Anus and perineum: Normal   Rectovaginal: Normal sphincter tone without palpated masses or tenderness.    Assessment/Plan:  58 y.o. G33P0010 female for annual gynecologic exam.   1. Postmenopausal.  No significant menopausal symptoms or any vaginal bleeding.  Report any vaginal bleeding. 2. Pap smear/HPV 2015.  Pap smear/HPV today.  No history of abnormal Pap smears previously. 3. Colonoscopy 2013.  Repeat at their recommended interval. 4. Mammography 2018.  Recommended patient schedule a screening mammogram.  Request slip given for [facility].  Breast exam normal today. 5. DEXA never.  Will plan further into the menopause. 6. Health maintenance.  No routine lab work done as patient does this elsewhere.  Follow-up 1 year, sooner as needed.   Anastasio Auerbach MD, 10:13 AM 12/23/2018

## 2018-12-23 NOTE — Addendum Note (Signed)
Addended by: Nelva Nay on: 12/23/2018 10:21 AM   Modules accepted: Orders

## 2018-12-24 LAB — PAP IG AND HPV HIGH-RISK: HPV DNA High Risk: NOT DETECTED

## 2019-01-02 DIAGNOSIS — I82401 Acute embolism and thrombosis of unspecified deep veins of right lower extremity: Secondary | ICD-10-CM | POA: Diagnosis not present

## 2019-01-02 DIAGNOSIS — R600 Localized edema: Secondary | ICD-10-CM | POA: Diagnosis not present

## 2019-01-02 DIAGNOSIS — I8001 Phlebitis and thrombophlebitis of superficial vessels of right lower extremity: Secondary | ICD-10-CM | POA: Diagnosis not present

## 2019-01-02 DIAGNOSIS — D6851 Activated protein C resistance: Secondary | ICD-10-CM | POA: Diagnosis not present

## 2019-01-05 ENCOUNTER — Ambulatory Visit (INDEPENDENT_AMBULATORY_CARE_PROVIDER_SITE_OTHER): Payer: 59 | Admitting: *Deleted

## 2019-01-05 ENCOUNTER — Other Ambulatory Visit: Payer: Self-pay

## 2019-01-05 DIAGNOSIS — I428 Other cardiomyopathies: Secondary | ICD-10-CM

## 2019-01-05 DIAGNOSIS — I5022 Chronic systolic (congestive) heart failure: Secondary | ICD-10-CM

## 2019-01-06 ENCOUNTER — Telehealth: Payer: Self-pay

## 2019-01-06 ENCOUNTER — Ambulatory Visit (INDEPENDENT_AMBULATORY_CARE_PROVIDER_SITE_OTHER): Payer: 59

## 2019-01-06 ENCOUNTER — Other Ambulatory Visit: Payer: Self-pay

## 2019-01-06 DIAGNOSIS — Z95810 Presence of automatic (implantable) cardiac defibrillator: Secondary | ICD-10-CM | POA: Diagnosis not present

## 2019-01-06 DIAGNOSIS — I5022 Chronic systolic (congestive) heart failure: Secondary | ICD-10-CM

## 2019-01-06 NOTE — Telephone Encounter (Signed)
Left message for patient to remind of missed remote transmission.  

## 2019-01-07 LAB — CUP PACEART REMOTE DEVICE CHECK
Implantable Lead Implant Date: 20180912
Implantable Lead Implant Date: 20180912
Implantable Lead Location: 753858
Implantable Lead Location: 753859
Implantable Lead Location: 753860
Implantable Lead Model: 4674
Implantable Lead Serial Number: 435131
Implantable Lead Serial Number: 813037
Implantable Lead Serial Number: 920623
Implantable Pulse Generator Implant Date: 20180912
MDC IDC LEAD IMPLANT DT: 20180912
MDC IDC SESS DTM: 20200325154610
Pulse Gen Serial Number: 193993

## 2019-01-07 NOTE — Progress Notes (Signed)
EPIC Encounter for ICM Monitoring  Patient Name: Monica Solis is a 58 y.o. female Date: 01/07/2019 Primary Care Physican: Reynold Bowen, MD Primary Cardiologist:Berry Electrophysiologist:Taylor LastWeight:330lbs(weighs daily) 01/09/2019 Weight: 332 lbs  Heart failure questions reviewed.  She reported she dx of leg DVT and prescribed Xarelto.   HeartLogic Heart Failure Index is4. Index has not crossed the threshold suggestingnormal.  Prescribed: Hydrochlorothiazide 25 mg Take 12.5 mg by mouth daily as needed (fluid).   Recommendations: No changes. Encouraged to call for fluid symptoms.  Follow-up plan: ICM clinic phone appointment on4/27/2020.   Copy of ICM check sent to Dr. Lovena Le.    [place], RN 01/07/2019 8:03 AM

## 2019-01-13 NOTE — Progress Notes (Signed)
Remote ICD transmission.   

## 2019-02-09 ENCOUNTER — Other Ambulatory Visit: Payer: Self-pay

## 2019-02-09 ENCOUNTER — Ambulatory Visit (INDEPENDENT_AMBULATORY_CARE_PROVIDER_SITE_OTHER): Payer: 59

## 2019-02-09 DIAGNOSIS — I5022 Chronic systolic (congestive) heart failure: Secondary | ICD-10-CM | POA: Diagnosis not present

## 2019-02-09 DIAGNOSIS — Z95810 Presence of automatic (implantable) cardiac defibrillator: Secondary | ICD-10-CM

## 2019-02-09 DIAGNOSIS — G4733 Obstructive sleep apnea (adult) (pediatric): Secondary | ICD-10-CM | POA: Diagnosis not present

## 2019-02-09 NOTE — Progress Notes (Signed)
EPIC Encounter for ICM Monitoring  Patient Name: Monica Solis is a 58 y.o. female Date: 02/09/2019 Primary Care Physican: Reynold Bowen, MD Primary Cardiologist:Berry Electrophysiologist:Taylor LastWeight:330lbs(weighs daily) 01/09/2019 Weight: 332 lbs 02/10/2019 Weight: unknown  Attempted call to patient and unable to reach.   Transmission reviewed.   HeartLogic Heart Failure Index is0 suggestingnormal.  Prescribed: Hydrochlorothiazide 25 mg Take 12.5 mg by mouth daily as needed (fluid).   Recommendations: Unable to reach.  Follow-up plan: ICM clinic phone appointment on6/10/2018.   Copy of ICM check sent to Dr. Lovena Le.    [place], RN 02/09/2019 8:11 AM

## 2019-02-10 ENCOUNTER — Telehealth: Payer: Self-pay

## 2019-02-10 NOTE — Telephone Encounter (Signed)
Remote ICM transmission received.  Attempted call to patient regarding ICM remote transmission and no answer and no voice mail.

## 2019-03-16 ENCOUNTER — Ambulatory Visit (INDEPENDENT_AMBULATORY_CARE_PROVIDER_SITE_OTHER): Payer: 59

## 2019-03-16 DIAGNOSIS — I5022 Chronic systolic (congestive) heart failure: Secondary | ICD-10-CM

## 2019-03-16 DIAGNOSIS — Z95810 Presence of automatic (implantable) cardiac defibrillator: Secondary | ICD-10-CM

## 2019-03-17 ENCOUNTER — Telehealth: Payer: Self-pay

## 2019-03-17 NOTE — Telephone Encounter (Signed)
Spoke with patient to remind of missed remote transmission 

## 2019-03-18 NOTE — Progress Notes (Signed)
EPIC Encounter for ICM Monitoring  Patient Name: Monica Solis is a 58 y.o. female Date: 03/18/2019 Primary Care Physican: Reynold Bowen, MD Primary Cardiologist:Berry Electrophysiologist:Taylor LastWeight:330lbs(weighs daily) 3/27/2020Weight: 332 lbs   Transmission reviewed and results sent via mychart.  HeartLogic Heart Failure Index is0 suggestingnormal.  Prescribed: Hydrochlorothiazide 25 mgTake 12.5 mg by mouth daily as needed (fluid).  Recommendations: Advised to limit salt intake to < 2000 mg daily and fluid intake to 64 oz daily.  Encouraged to call if experiencing fluid symptoms.  Follow-up plan: ICM clinic phone appointment on7/03/2019.   Copy of ICM check sent to Dr. Lovena Le.            [name], RN 03/18/2019 3:30 PM

## 2019-04-06 ENCOUNTER — Ambulatory Visit (INDEPENDENT_AMBULATORY_CARE_PROVIDER_SITE_OTHER): Payer: 59 | Admitting: *Deleted

## 2019-04-06 DIAGNOSIS — I5022 Chronic systolic (congestive) heart failure: Secondary | ICD-10-CM

## 2019-04-06 DIAGNOSIS — I428 Other cardiomyopathies: Secondary | ICD-10-CM

## 2019-04-06 LAB — CUP PACEART REMOTE DEVICE CHECK
Date Time Interrogation Session: 20200622141038
Implantable Lead Implant Date: 20180912
Implantable Lead Implant Date: 20180912
Implantable Lead Implant Date: 20180912
Implantable Lead Location: 753858
Implantable Lead Location: 753859
Implantable Lead Location: 753860
Implantable Lead Model: 293
Implantable Lead Model: 4674
Implantable Lead Model: 7741
Implantable Lead Serial Number: 435131
Implantable Lead Serial Number: 813037
Implantable Lead Serial Number: 920623
Implantable Pulse Generator Implant Date: 20180912
Pulse Gen Serial Number: 193993

## 2019-04-08 ENCOUNTER — Ambulatory Visit: Payer: 59 | Admitting: Neurology

## 2019-04-15 NOTE — Progress Notes (Signed)
Remote ICD transmission.   

## 2019-04-20 ENCOUNTER — Ambulatory Visit (INDEPENDENT_AMBULATORY_CARE_PROVIDER_SITE_OTHER): Payer: 59

## 2019-04-20 DIAGNOSIS — I5022 Chronic systolic (congestive) heart failure: Secondary | ICD-10-CM

## 2019-04-20 DIAGNOSIS — Z95810 Presence of automatic (implantable) cardiac defibrillator: Secondary | ICD-10-CM

## 2019-04-21 NOTE — Progress Notes (Signed)
EPIC Encounter for ICM Monitoring  Patient Name: Monica Solis is a 58 y.o. female Date: 04/21/2019 Primary Care Physican: Reynold Bowen, MD Primary Cardiologist:Berry Electrophysiologist:Taylor 04/21/2019 Weight:332lbs(weighs daily)  Heart Failure questions reviewed, pt asymptomatic.   04/19/2019 HeartLogic Heart Failure Index is 2 which is within normal threshold range.  Prescribed: Hydrochlorothiazide 25 mgTake 12.5 mg by mouth daily as needed (fluid).  Recommendations: Advised to limit salt intake to < 2000 mg daily and fluid intake to 64 oz daily.  Encouraged to call if experiencing fluid symptoms.  Follow-up plan: ICM clinic phone appointment on8/07/2019.   Copy of ICM check sent to Dr. Lovena Le.     [place], RN 04/21/2019 11:08 AM

## 2019-05-25 ENCOUNTER — Ambulatory Visit (INDEPENDENT_AMBULATORY_CARE_PROVIDER_SITE_OTHER): Payer: 59

## 2019-05-25 DIAGNOSIS — I5022 Chronic systolic (congestive) heart failure: Secondary | ICD-10-CM

## 2019-05-25 DIAGNOSIS — Z95810 Presence of automatic (implantable) cardiac defibrillator: Secondary | ICD-10-CM | POA: Diagnosis not present

## 2019-05-29 ENCOUNTER — Telehealth: Payer: Self-pay

## 2019-05-29 NOTE — Telephone Encounter (Signed)
error 

## 2019-05-29 NOTE — Progress Notes (Signed)
EPIC Encounter for ICM Monitoring  Patient Name: Monica Solis is a 58 y.o. female Date: 05/29/2019 Primary Care Physican: Reynold Bowen, MD Primary Cardiologist:Berry Electrophysiologist:Taylor 04/21/2019 Weight:332lbs(weighs daily) 05/29/2019 Weight: 333 lbs  Heart Failure questions reviewed, pt gained 5 lbs this past weekend and took whole tablet of HCTZ and weight returned to baseline.   05/24/2019 HeartLogic Heart Failure Index is 2 which is within normal threshold range.  Prescribed: Hydrochlorothiazide 25 mgTake 12.5 mg by mouth daily as needed (fluid).  Recommendations:  Encouraged to call if experiencing fluid symptoms.  Follow-up plan: ICM clinic phone appointment on10/02/2019. OV with Dr Gwenlyn Found 06/10/2019.  She will be calling to schedule yearly appointment with Dr Lovena Le.  Copy of ICM check sent to Dr. Lovena Le.   [name], RN 05/29/2019 8:51 AM

## 2019-06-08 ENCOUNTER — Ambulatory Visit (INDEPENDENT_AMBULATORY_CARE_PROVIDER_SITE_OTHER): Payer: 59 | Admitting: Neurology

## 2019-06-08 ENCOUNTER — Encounter: Payer: Self-pay | Admitting: Neurology

## 2019-06-08 ENCOUNTER — Other Ambulatory Visit: Payer: Self-pay

## 2019-06-08 VITALS — BP 128/78 | HR 68 | Temp 98.3°F | Ht 69.0 in | Wt 335.0 lb

## 2019-06-08 DIAGNOSIS — I82409 Acute embolism and thrombosis of unspecified deep veins of unspecified lower extremity: Secondary | ICD-10-CM | POA: Diagnosis not present

## 2019-06-08 DIAGNOSIS — I454 Nonspecific intraventricular block: Secondary | ICD-10-CM | POA: Diagnosis not present

## 2019-06-08 DIAGNOSIS — G4733 Obstructive sleep apnea (adult) (pediatric): Secondary | ICD-10-CM

## 2019-06-08 DIAGNOSIS — E11628 Type 2 diabetes mellitus with other skin complications: Secondary | ICD-10-CM | POA: Diagnosis not present

## 2019-06-08 DIAGNOSIS — Z9989 Dependence on other enabling machines and devices: Secondary | ICD-10-CM

## 2019-06-08 DIAGNOSIS — Z794 Long term (current) use of insulin: Secondary | ICD-10-CM

## 2019-06-08 NOTE — Progress Notes (Signed)
SLEEP MEDICINE CLINIC   Provider:  Larey Seat, M D  Primary Care Physician:  Reynold Bowen, MD   Referring Provider: Reynold Bowen, MD , Endocrinologist at [facility].    Chief Complaint  Patient presents with  . Follow-up    pt alone, rm 11, pt states that she is using the machine but feels her sleep is worse now then it was before using the machine. DME [employer].     HPI:  Monica Solis is a 58 y.o. female , seen here as in a referral  from Dr. Forde Dandy for a sleep evaluation- she reports feeling exhausted and strained by minor activity. Her STOP Bang score was 5 - urging an evaluation.   Monica Solis is a 58 year old, right-handed Caucasian female patient, who presents with a significant diagnosis of nonischemic cardiac myopathy.  She has a history of high blood pressure, high cholesterol, diabetes, GERD, lower extremity dependent edema, left bundle branch block and factor V Leiden hypothyroidism diverticulosis uterine fibroids, she had a cardiac defibrillator implanted on June 26, 2017. She reports getting a little bit more strength back now. Her next cardiology appointment is on Dec 14th 2018, Dr Crissie Sickles, electrophysiology.  After the implantation in hospital she was recorded having hypoxemia and irregular breathing.  " In 2011 I was diagnosed with EF 55% and LBBB.  I had pain in my chest throughout  2017 and early 2018- and was told it was  GERD. I was asking for a referral to bariatric surgery and there I was diagnosed with CHF class 3- and changed doctors. The heart failure was just diagnosed earlier this year".  Monica Solis also stated that her mother suffers from a cardiomyopathy. She follows Quay Burow, MD for cardiology .   Sleep habits are as follows:  The patient usually watches TV in the den before retreating to her bedroom between 11 and 11:30 PM.  She may still read for a while, and if she cannot fall asleep within 30-60 minutes she will get back  up.  Lately, she has had frequently difficulties initiating sleep within an hour.  She would go back to the den.  She may play on her iPad or watch TV again until she feels sleepy enough to try.  Between 2 and 3 AM she is usually asleep.  She was troubled by nocturia before the defibrillator was implanted, now she only rises once at night.  She usually can sleep through until 5 AM when her husband rises. She may get another 1-2 hours of sleep. In her working years she rose at 5 AM.   She always wakes with a very dry mouth, and headaches  ( dull and throbbing) and frequently feeling clammy, with night sweats, palpitations.   The patient underwent a sleep study by HST - attended sleep study was not authorized by her health insurance.   NAME: Monica Solis                                                       DOB: 07/28/1961 MEDICAL RECORD NUMBER [id number]                                       DOS: 09/25/2017 REFERRING PHYSICIAN:  Reynold Bowen, M.D.  STUDY PERFORMED: HST watch pat  HISTORY: Her STOP Bang score was 5 - urging an evaluation. Monica Solis is a 58 year old, right-handed Caucasian female patient, who presents with a significant diagnosis of non-ischemic cardiac myopathy.  She has a history of high blood pressure, high cholesterol, diabetes, GERD, lower extremity dependent edema, left bundle branch block and factor V Leiden, hypothyroidism, diverticulosis, uterine fibroids, she had a cardiac defibrillator implanted on June 26, 2017.  While in hospital she was recorded having hypoxemia and irregular breathing. Epworth score 3 points, Fatigue severity score 40 points - BMI: 49.6  STUDY RESULTS: Total Recording Time: 7 hours 28 minutes Total Apnea/Hypopnea Index (AHI):  18.1 /hour; RDI 18.3/hr.  Average Oxygen Saturation:  92 %; Lowest Oxygen Desaturation:  79%  Total Time Oxygen Saturation Below 89%: 5.5% =19.9 minutes  Average Heart Rate: 72 bpm (55-92)   IMPRESSION: Mild- Moderate obstructive  sleep apnea, no central apneas emerging.  Desaturation time was low. REM sleep accentuated apnea.  RECOMMENDATION:  CPAP is recommended for REM dependent apnea and cardiac arrhythmia or failure patients. I will order auto CPAP 5-15 cm water, heated humidity and interface of choice.  Larey Seat, M.D.      10-05-2017  Medical Director of [facility] at [facility], [place], [place] and [employer] and accredited by [employer]  Sleep medical history and family sleep history: mother with cardiomyopathy. Mother has OSA on CPAP.  Ejection fracture recovered to 55 % since last seen.  She has a pacemaker- defibrillator implanted 06-26-2017. She had 4 DVT and developed leg swelling, leg pain. " my leg looked like a map" . No PE. Xeralto On chronic anticoagulation.    06-08-2019- RV after one year for Mrs Solis, a 58 year old Caucasian right handed female with obesity, DVT, CHF and OSA on CPAP Monica Solis reports that also she may some night sleep 10 hours and use the CPAP machine for 10 hours she has overall less tolerance for CPAP.  However her compliance data are excellent she has used the machine 27 out of 30 days, over 4 hours on 23 of these.  Her average use of usage on days used is only 4 hours 24 minutes, her minimum setting for pressure is 6 and maximum pressure is 10 cmH2O with 3 cm EPR a residual AHI is excellent at only 0.4/h, 95th percentile pressure is 8.5 which means that we have to increase her maximum pressure a little bit.  She does not have central apneas arising.  The air leaks are minimal so she achieves a good seal - usually.      Review of Systems: Out of a complete 14 system review, the patient complains of only the following symptoms, and all other reviewed systems are negative.   How likely are you to doze in the following situations: 0 = not likely, 1 = slight chance, 2 = moderate chance, 3 = high chance  Sitting and Reading? Watching Television? 1 Sitting inactive in a public place (theater  or meeting)? Lying down in the afternoon when circumstances permit? 1 Sitting and talking to someone? Sitting quietly after lunch without alcohol? In a car, while stopped for a few minutes in traffic? As a passenger in a car for an hour without a break? 1  Total = 3/24  FSS at 8/63       Family History  Problem Relation Age of Onset  . Hypertension Mother   . Heart disease Mother   . Alzheimer's  disease Mother   . Atrial fibrillation Mother   . Heart disease Father   . Breast cancer Maternal Aunt        30's  . Alzheimer's disease Maternal Aunt   . Diabetes Maternal Grandmother   . Heart disease Maternal Grandmother   . Heart disease Paternal Grandmother   . Cancer Maternal Grandfather        Unknown type  . Mitral valve prolapse Sister   . Heart disease Paternal Grandfather     Past Medical History:  Diagnosis Date  . Abnormal uterine bleeding (AUB)   . AICD (automatic cardioverter/defibrillator) present   . At risk for sleep apnea    STOP-BANG=  5       SENT TO PCP 10-01-2016  . Blood clot in vein    "superficial blood clots in BLE; I had 6-7 since 2000" (06/26/2017)  . BMI 45.0-49.9, adult ([place])   . Chronic bronchitis ([place])   . Diverticulosis of colon   . Endometrial polyp   . Factor 5 Leiden mutation, heterozygous ([place])    dx 2011  . GERD (gastroesophageal reflux disease)   . High cholesterol   . History of DVT of lower extremity 2000; 2011   "I've had them in both"  . History of ectopic pregnancy 1998  . Hypertension   . Hypothyroidism   . Left bundle branch block (LBBB)   . Osteoarthritis    "all over" (06/26/2017)  . Peripheral neuropathy   . PONV (postoperative nausea and vomiting)   . Type 2 diabetes mellitus treated with insulin [facility])     Past Surgical History:  Procedure Laterality Date  . BI-VENTRICULAR IMPLANTABLE CARDIOVERTER DEFIBRILLATOR  (CRT-D)  06/26/2017  . BIV ICD INSERTION CRT-D N/A 06/26/2017   Procedure: BIV ICD INSERTION CRT-D;   Surgeon: Evans Lance, MD;  Location: [facility];  Service: Cardiovascular;  Laterality: N/A;  . COLONOSCOPY  2013  . DILATATION & CURETTAGE/HYSTEROSCOPY WITH MYOSURE N/A 10/05/2016   Procedure: [place];  Surgeon: Anastasio Auerbach, MD;  Location: [facility];  Service: Gynecology;  Laterality: N/A;  requests to follow 7:30am caserequests one hour OR time  . DILATION AND CURETTAGE OF UTERUS  12/06/2007  . LAPAROSCOPY BILATERAL TUBAL LIGATION WITH FALOPE RINGS/  D&C HYSTEROSCOPY WITH POLYPECTOMY  12/06/2007   and Lysis Adhesions  . LEFT HEART CATH AND CORONARY ANGIOGRAPHY N/A 01/03/2017   Procedure: Left Heart Cath and Coronary Angiography;  Surgeon: Lorretta Harp, MD;  Location: [place] [facility];  Service: Cardiovascular;  Laterality: N/A;  . TUBAL LIGATION  12/06/2007    Current Outpatient Medications  Medication Sig Dispense Refill  . calcium carbonate (TUMS - DOSED IN MG ELEMENTAL CALCIUM) 500 MG chewable tablet Chew 1 tablet by mouth daily as needed for indigestion or heartburn.    . carvedilol (COREG) 3.125 MG tablet Take 1 tablet (3.125 mg total) by mouth 2 (two) times daily. 180 tablet 3  . Cholecalciferol (VITAMIN D3) 2000 UNITS capsule Take 4,000 Units by mouth daily with supper.     . diclofenac sodium (VOLTAREN) 1 % GEL Apply 2 g topically 4 (four) times daily.     [name] esomeprazole (NEXIUM) 40 MG capsule Take by mouth daily.  5  . fexofenadine (ALLEGRA) 180 MG tablet Take 180 mg by mouth daily as needed for allergies or rhinitis.    [name] gabapentin (NEURONTIN) 100 MG capsule Take 200 mg by mouth at bedtime.     [name]  hydrochlorothiazide (HYDRODIURIL) 12.5 MG tablet Take 12.5 mg by mouth daily as needed (fluid).     . insulin aspart (NOVOLOG) 100 UNIT/ML injection Inject 7-10 Units into the skin 3 (three) times daily as needed for high blood sugar. Per SSC 7-10units when cbg above 120    . insulin detemir (LEVEMIR) 100  UNIT/ML injection Inject 34 Units into the skin at bedtime.     . Iron-Vitamins (GERITOL COMPLETE) TABS Take 1 tablet by mouth daily with supper.     . levothyroxine (SYNTHROID, LEVOTHROID) 75 MCG tablet Take 75 mcg by mouth daily before breakfast.     . Menthol, Topical Analgesic, (BENGAY EX) Apply 1 application topically 2 (two) times daily as needed (pain).    . Multiple Vitamins-Minerals (MULTIVITAMIN ADULT PO) Take by mouth.    . Probiotic Product (PROBIOTIC DAILY) CAPS Take 1 capsule by mouth daily with supper.     . sacubitril-valsartan (ENTRESTO) 24-26 MG Take 1 tablet by mouth 2 (two) times daily. 180 tablet 3  . XARELTO 20 MG TABS tablet Take 20 mg by mouth daily.     No current facility-administered medications for this visit.    Facility-Administered Medications Ordered in Other Visits  Medication Dose Route Frequency Provider Last Rate Last Dose  . technetium tetrofosmin (TC-MYOVIEW) injection [id number] millicurie  [id number] millicurie Intravenous Once PRN Pixie Casino, MD        Allergies as of 06/08/2019 - Review Complete 06/08/2019  Allergen Reaction Noted  . Crestor [rosuvastatin calcium] Other (See Comments) 06/24/2017  . Other Swelling 01/01/2017  . Benadryl [diphenhydramine hcl] Hives 07/10/2011  . Canagliflozin Other (See Comments) 12/15/2015  . Liraglutide Other (See Comments) 09/27/2014  . Metformin Other (See Comments) 05/29/2016  . Pravastatin Other (See Comments) and Rash 05/04/2013  . Tramadol Other (See Comments) and Rash 03/26/2014    Vitals: BP 128/78   Pulse 68   Temp 98.3 F (36.8 C)   Ht 5\' 9"  (1.753 m)   Wt (!) 335 lb (152 kg)   LMP 08/07/2016   BMI 49.47 kg/m  Last Weight:  Wt Readings from Last 1 Encounters:  06/08/19 (!) 335 lb (152 kg)   [id number] mass index is 49.47 kg/m.     Last Height:   Ht Readings from Last 1 Encounters:  06/08/19 5\' 9"  (1.753 m)    Physical exam:  General: The patient is awake, alert and appears not in acute  distress.she is edentulous, and part of her air leaks comes from lack of dental support.  She is obese, and cooperative.   Head: Normocephalic, atraumatic. Neck is supple. Mallampati 2- midline.   neck circumference:17. Nasal airflow patent ,  a TMJ click is not  evident . Retrognathia is not seen. Crowded lower jaw( but lot longer any dentition) .  Cardiovascular:  Regular rate and rhythm, without murmurs or carotid bruit, and without distended neck veins. Respiratory: Lungs are clear to auscultation. Skin:  Ankle edema, leg skin has been discoloured, petechiae.   Trunk: BMI is close to 49.74 kg/m2. .The patient's posture is stooped. Shoulders droopy.  Neurologic exam : Attention span & concentration ability appears normal.  Speech is fluent, without dysarthria, dysphonia or aphasia. Mood and affect are appropriate.  Cranial nerves:  Taste and smell sense intact.  Pupils are equal in size, round and briskly reactive to light.Extraocular movements  in vertical and horizontal planes intact and without nystagmus. Visual fields by finger perimetry are intact. Hearing to finger rub intact.  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 Shoulder droopy- extremely droopy- she reports shoulder pain due to rotary cuff impingement. ,   Motor exam: Normal tone, muscle bulk and symmetric strength in upper extremities.  Sensory:  Fine touch, pinprick and vibration were normal. Coordination:  Finger-to-nose maneuver  without evidence of ataxia, dysmetria or tremor. Gait and station: Patient walks without assistive device . Wide based . Turns with 4 Steps.  Deep tendon reflexes: in the  upper  extremities are symmetric and intact.   Patella attenuated, achilles absent. Babinski maneuver response is attenuated    Assessment:  After physical and neurologic examination, review of laboratory studies,  Personal review of imaging studies,  reports of other /same  Imaging studies, results of polysomnography and / or neurophysiology testing and pre-existing records as far as provided in visit., my assessment is   February 2020 bilateral deep venous thrombosis, she has been tested for factor V Leiden and is positive. She is chronically anticoagulated.   She has hypothyroidism, she has diabetes, she has reached the border zone from morbid obesity to super obesity as her BMI is very close to 50.   She had congestive heart failure her ejection fraction recovered from 30%  Ef to 55%, she no longer has  obstructive apneas.   The defibrillator will prevent tachycardia,, pacemaker component will prevent her from having significant bradycardia.  It should help with reducing the ankle edema and she has been on HCTZ at AM.   1) OSA or CSA- complex apnea with hypoxemia- given the patient's multiple comorbidities, compliant CPAP user, but could use it a little more each night. c controlled sleep apnea, only OSA residual, and no centrals.[name]   2) Morbid obesity with serious co-morbidities.  In addition I would like for Monica Solis to consider further weight management by medical means before she resorts to a bariatric surgery.  If she likes I will refer her to Dr. Redgie Grayer with the  medical weight management at [facility]. She has diverticulitis which made her limit her choice of vegetables and fruits .   3) she sleeps on an incline - elevated head of bed - by wedge- 12 inches. In adition to 3-4 pillows, orthopnea.    I spent more than 20 minutes of face to face time with the patient.  Greater than 50% of time was spent in counseling and coordination of care. We have discussed the diagnosis and differential and I answered the patient's questions.    Plan:  Continue use of CPAP as directed ,  I will increase the initial pressure to 6 cm water and we had bumped the pressure form 12 to 10 cm water, well tolerated, I like to change EPR from 3 to 2  cm water. keep current mask. She can adjust humidity herself.  Rv with NP in one year. [name]     Larey Seat, MD 0000000, [id number] PM  Certified in Neurology by ABPN Certified in [place] by [facility] [address], [place], [place] [address]

## 2019-06-08 NOTE — Patient Instructions (Signed)
Sleep Apnea Sleep apnea is a condition in which breathing pauses or becomes shallow during sleep. Episodes of sleep apnea usually last 10 seconds or longer, and they may occur as many as 20 times an hour. Sleep apnea disrupts your sleep and keeps your body from getting the rest that it needs. This condition can increase your risk of certain health problems, including:  Heart attack.  Stroke.  Obesity.  Diabetes.  Heart failure.  Irregular heartbeat. What are the causes? There are three kinds of sleep apnea:  Obstructive sleep apnea. This kind is caused by a blocked or collapsed airway.  Central sleep apnea. This kind happens when the part of the brain that controls breathing does not send the correct signals to the muscles that control breathing.  Mixed sleep apnea. This is a combination of obstructive and central sleep apnea. The most common cause of this condition is a collapsed or blocked airway. An airway can collapse or become blocked if:  Your throat muscles are abnormally relaxed.  Your tongue and tonsils are larger than normal.  You are overweight.  Your airway is smaller than normal. What increases the risk? You are more likely to develop this condition if you:  Are overweight.  Smoke.  Have a smaller than normal airway.  Are elderly.  Are female.  Drink alcohol.  Take sedatives or tranquilizers.  Have a family history of sleep apnea. What are the signs or symptoms? Symptoms of this condition include:  Trouble staying asleep.  Daytime sleepiness and tiredness.  Irritability.  Loud snoring.  Morning headaches.  Trouble concentrating.  Forgetfulness.  Decreased interest in sex.  Unexplained sleepiness.  Mood swings.  Personality changes.  Feelings of depression.  Waking up often during the night to urinate.  Dry mouth.  Sore throat. How is this diagnosed? This condition may be diagnosed with:  A medical history.  A physical  exam.  A series of tests that are done while you are sleeping (sleep study). These tests are usually done in a sleep lab, but they may also be done at home. How is this treated? Treatment for this condition aims to restore normal breathing and to ease symptoms during sleep. It may involve managing health issues that can affect breathing, such as high blood pressure or obesity. Treatment may include:  Sleeping on your side.  Using a decongestant if you have nasal congestion.  Avoiding the use of depressants, including alcohol, sedatives, and narcotics.  Losing weight if you are overweight.  Making changes to your diet.  Quitting smoking.  Using a device to open your airway while you sleep, such as: ? An oral appliance. This is a custom-made mouthpiece that shifts your lower jaw forward. ? A continuous positive airway pressure (CPAP) device. This device blows air through a mask when you breathe out (exhale). ? A nasal expiratory positive airway pressure (EPAP) device. This device has valves that you put into each nostril. ? A bi-level positive airway pressure (BPAP) device. This device blows air through a mask when you breathe in (inhale) and breathe out (exhale).  Having surgery if other treatments do not work. During surgery, excess tissue is removed to create a wider airway. It is important to get treatment for sleep apnea. Without treatment, this condition can lead to:  High blood pressure.  Coronary artery disease.  In men, an inability to achieve or maintain an erection (impotence).  Reduced thinking abilities. Follow these instructions at home: Lifestyle  Make any lifestyle changes   that your health care provider recommends.  Eat a healthy, well-balanced diet.  Take steps to lose weight if you are overweight.  Avoid using depressants, including alcohol, sedatives, and narcotics.  Do not use any products that contain nicotine or tobacco, such as cigarettes,  e-cigarettes, and chewing tobacco. If you need help quitting, ask your health care provider. General instructions  Take over-the-counter and prescription medicines only as told by your health care provider.  If you were given a device to open your airway while you sleep, use it only as told by your health care provider.  If you are having surgery, make sure to tell your health care provider you have sleep apnea. You may need to bring your device with you.  Keep all follow-up visits as told by your health care provider. This is important. Contact a health care provider if:  The device that you received to open your airway during sleep is uncomfortable or does not seem to be working.  Your symptoms do not improve.  Your symptoms get worse. Get help right away if:  You develop: ? Chest pain. ? Shortness of breath. ? Discomfort in your back, arms, or stomach.  You have: ? Trouble speaking. ? Weakness on one side of your body. ? Drooping in your face. These symptoms may represent a serious problem that is an emergency. Do not wait to see if the symptoms will go away. Get medical help right away. Call your local emergency services (911 in the [place] Do not drive yourself to the hospital. Summary  Sleep apnea is a condition in which breathing pauses or becomes shallow during sleep.  The most common cause is a collapsed or blocked airway.  The goal of treatment is to restore normal breathing and to ease symptoms during sleep. This information is not intended to replace advice given to you by your health care provider. Make sure you discuss any questions you have with your health care provider. Document Released: 09/21/2002 Document Revised: 07/18/2018 Document Reviewed: 05/27/2018 Elsevier Patient Education  2020 Elsevier Inc.  

## 2019-06-10 ENCOUNTER — Telehealth (INDEPENDENT_AMBULATORY_CARE_PROVIDER_SITE_OTHER): Payer: 59 | Admitting: Cardiovascular Disease

## 2019-06-10 ENCOUNTER — Other Ambulatory Visit: Payer: Self-pay

## 2019-06-10 ENCOUNTER — Telehealth: Payer: Self-pay

## 2019-06-10 DIAGNOSIS — I5022 Chronic systolic (congestive) heart failure: Secondary | ICD-10-CM

## 2019-06-10 DIAGNOSIS — I82412 Acute embolism and thrombosis of left femoral vein: Secondary | ICD-10-CM

## 2019-06-10 DIAGNOSIS — I454 Nonspecific intraventricular block: Secondary | ICD-10-CM

## 2019-06-10 DIAGNOSIS — Z95810 Presence of automatic (implantable) cardiac defibrillator: Secondary | ICD-10-CM

## 2019-06-10 DIAGNOSIS — I10 Essential (primary) hypertension: Secondary | ICD-10-CM

## 2019-06-10 DIAGNOSIS — E782 Mixed hyperlipidemia: Secondary | ICD-10-CM

## 2019-06-10 DIAGNOSIS — G4730 Sleep apnea, unspecified: Secondary | ICD-10-CM

## 2019-06-10 DIAGNOSIS — Z6841 Body Mass Index (BMI) 40.0 and over, adult: Secondary | ICD-10-CM

## 2019-06-10 DIAGNOSIS — I519 Heart disease, unspecified: Secondary | ICD-10-CM

## 2019-06-10 MED ORDER — EZETIMIBE 10 MG PO TABS: 10.0000 mg | ORAL_TABLET | Freq: Every day | ORAL | 3 refills | Status: DC

## 2019-06-10 MED ORDER — CARVEDILOL 3.125 MG PO TABS: 3.1250 mg | ORAL_TABLET | Freq: Two times a day (BID) | ORAL | 3 refills | Status: DC

## 2019-06-10 MED ORDER — SACUBITRIL-VALSARTAN 24-26 MG PO TABS: 1.0000 | ORAL_TABLET | Freq: Two times a day (BID) | ORAL | 3 refills | Status: DC

## 2019-06-10 NOTE — Patient Instructions (Addendum)
Medication Instructions:  Your physician has recommended you make the following change in your medication:   START EZETIMIBE (ZETIA) 10 MG BY MOUTH DAILY  If you need a refill on your cardiac medications before your next appointment, please call your pharmacy.   Lab work: Your physician recommends that you return for lab work in: [place] TEST. YOU WILL RECEIVE A LAB SLIP IN THE MAIL. PLEASE DO NOT EAT OR DRINK (EXCEPT WATER) ANYTHING AFTER MIDNIGHT ON THE DAY YOU CHOOSE TO PRESENT FOR LAB WORK. YOU MAY EAT AFTER YOUR BLOOD HAS BEEN COLLECTED. NO APPOINTMENT IS NEEDED.  If you have labs (blood work) drawn today and your tests are completely normal, you will receive your results only by: [name] MyChart Message (if you have MyChart) OR . A paper copy in the mail If you have any lab test that is abnormal or we need to change your treatment, we will call you to review the results.  Testing/Procedures: NONE  Follow-Up: At [facility], you and your health needs are our priority.  As part of our continuing mission to provide you with exceptional heart care, we have created designated Provider Care Teams.  These Care Teams include your primary Cardiologist (physician) and Advanced Practice Providers (APPs -  Physician Assistants and Nurse Practitioners) who all work together to provide you with the care you need, when you need it. You will need a follow up appointment in 6 months with Kerin Ransom, PA-C AND IN 12 MONTHS Dr. Quay Burow.  Please call our office 2 months in advance to schedule each appointment.    Any Other Special Instructions Will Be Listed Below (If Applicable). You have an upcoming appointment at [facility] with Tommye Standard, PA-C on 06/16/2019 at 9:30am for ICD check.

## 2019-06-10 NOTE — Addendum Note (Signed)
Addended by: Annita Brod on: 06/10/2019 10:11 AM   Modules accepted: Orders

## 2019-06-10 NOTE — Progress Notes (Signed)
Virtual Visit via Telephone Note   This visit type was conducted due to national recommendations for restrictions regarding the COVID-19 Pandemic (e.g. social distancing) in an effort to limit this patient's exposure and mitigate transmission in our community.  Due to her co-morbid illnesses, this patient is at least at moderate risk for complications without adequate follow up.  This format is felt to be most appropriate for this patient at this time.  The patient did not have access to video technology/had technical difficulties with video requiring transitioning to audio format only (telephone).  All issues noted in this document were discussed and addressed.  No physical exam could be performed with this format.  Please refer to the patient's chart for her  consent to telehealth for [facility].   Date:  06/10/2019   ID:  Monica Solis, DOB [birth year], MRN [id number]  Patient Location: Home Provider Location: Home  PCP:  Reynold Bowen, MD  Cardiologist: Dr. Quay Burow Electrophysiologist: Dr. Crissie Sickles  Evaluation Performed:  Follow-Up Visit  Chief Complaint: Follow-up nonischemic cardiomyopathy  History of Present Illness:    Monica Solis is a 58 y.o. severely overweight married Caucasian female with no children who was referred by her general surgeon, Dr. Kieth Brightly for preoperative coronary vessel clearance prior to elective Roux-en-Y bariatric surgery. I last saw her in the office  06/11/2018. Her risk factors for heart disease include treated diabetes, and hypertension. She does have a strong family history of heart disease with a father who died at age 81 of a myocardial infarction and brother at age 57. She has never had a heart attack or stroke. She denies chest pain or shortness of breath. She does have factor V Leiden deficiency has had DVT and pulmonary emboli in the past (2011). I performed a pharmacologic Myoview stress test that showed no evidence of ischemia but  gated SPECT showed an ejection fraction of 35%. This was confirmed by 2-D echocardiography. She does now admit to episodes of chest burning which she has thought was reflux in the past. She underwent outpatien are without COVID-19 okay on left I believe 6 months ago I think we arrange at that way your bariatric surgery and on I know we we did a t coronary angiography. The right radial approach by myself 01/03/17 revealing normal coronary arteries with an EF in the 30-35% range. She does have symptoms of congestive heart failure as well, class II to class III, and left bundle-branch block.Since I saw her back in 3 months ago we have optimized her heart failure pharmacotherapy for her EF by 2-D echo performed 05/17/17 remained depressed at 30-35% I referred her to Dr. Crissie Sickles for consideration of IV ICD implantation for CRT which he did successfully on 06/26/17.  Follow-up 2D echo performed 12/16/2017 revealed normal ejection fraction.  Since I saw her a year ago she is done well.  She did see Kerin Ransom, PA-C in the office 12/11/2018.  She feels clinically improved since the ICD implantation with CRT with a normal EF.  She does admit to dietary indiscretion and has decided not to pursue bariatric surgery.   The patient does not have symptoms concerning for COVID-19 infection (fever, chills, cough, or new shortness of breath).    Past Medical History:  Diagnosis Date   Abnormal uterine bleeding (AUB)    AICD (automatic cardioverter/defibrillator) present    At risk for sleep apnea    STOP-BANG=  5       SENT TO PCP  10-01-2016   Blood clot in vein    "superficial blood clots in BLE; I had 6-7 since 2000" (06/26/2017)   BMI 45.0-49.9, adult (HCC)    Chronic bronchitis (HCC)    Diverticulosis of colon    Endometrial polyp    Factor 5 Leiden mutation, heterozygous ([place])    dx 2011   GERD (gastroesophageal reflux disease)    High cholesterol    History of DVT of lower extremity 2000; 2011    "I've had them in both"   History of ectopic pregnancy 1998   Hypertension    Hypothyroidism    Left bundle branch block (LBBB)    Osteoarthritis    "all over" (06/26/2017)   Peripheral neuropathy    PONV (postoperative nausea and vomiting)    Type 2 diabetes mellitus treated with insulin [facility])    Past Surgical History:  Procedure Laterality Date   BI-VENTRICULAR IMPLANTABLE CARDIOVERTER DEFIBRILLATOR  (CRT-D)  06/26/2017   BIV ICD INSERTION CRT-D N/A 06/26/2017   Procedure: BIV ICD INSERTION CRT-D;  Surgeon: Evans Lance, MD;  Location: [place] [facility];  Service: Cardiovascular;  Laterality: N/A;   COLONOSCOPY  2013   DILATATION & CURETTAGE/HYSTEROSCOPY WITH MYOSURE N/A 10/05/2016   Procedure: [place];  Surgeon: Anastasio Auerbach, MD;  Location: [place];  Service: Gynecology;  Laterality: N/A;  requests to follow 7:30am caserequests one hour OR time   DILATION AND CURETTAGE OF UTERUS  12/06/2007   LAPAROSCOPY BILATERAL TUBAL LIGATION WITH FALOPE RINGS/  D&C HYSTEROSCOPY WITH POLYPECTOMY  12/06/2007   and Lysis Adhesions   LEFT HEART CATH AND CORONARY ANGIOGRAPHY N/A 01/03/2017   Procedure: Left Heart Cath and Coronary Angiography;  Surgeon: Lorretta Harp, MD;  Location: [place] [facility];  Service: Cardiovascular;  Laterality: N/A;   TUBAL LIGATION  12/06/2007     Current Meds  Medication Sig   calcium carbonate (TUMS - DOSED IN MG ELEMENTAL CALCIUM) 500 MG chewable tablet Chew 1 tablet by mouth daily as needed for indigestion or heartburn.   carvedilol (COREG) 3.125 MG tablet Take 1 tablet (3.125 mg total) by mouth 2 (two) times daily.   Cholecalciferol (VITAMIN D3) 2000 UNITS capsule Take 4,000 Units by mouth daily with supper.    diclofenac sodium (VOLTAREN) 1 % GEL Apply 2 g topically 4 (four) times daily.    esomeprazole (NEXIUM) 40 MG capsule Take by mouth daily.   fexofenadine  (ALLEGRA) 180 MG tablet Take 180 mg by mouth daily as needed for allergies or rhinitis.   gabapentin (NEURONTIN) 100 MG capsule Take 300 mg by mouth at bedtime. TAKE ONE TABLET IN THE MORNING AND TWO TABLETS AT NIGHT.   hydrochlorothiazide (HYDRODIURIL) 12.5 MG tablet Take 12.5 mg by mouth daily as needed (fluid).    insulin aspart (NOVOLOG) 100 UNIT/ML injection Inject 7-10 Units into the skin 3 (three) times daily as needed for high blood sugar. Per SSC 7-10units when cbg above 120   insulin detemir (LEVEMIR) 100 UNIT/ML injection Inject 36 Units into the skin at bedtime.    Iron-Vitamins (GERITOL COMPLETE) TABS Take 1 tablet by mouth daily with supper.    levothyroxine (SYNTHROID, LEVOTHROID) 75 MCG tablet Take 75 mcg by mouth daily before breakfast.    Menthol, Topical Analgesic, (BENGAY EX) Apply 1 application topically 2 (two) times daily as needed (pain).   Multiple Vitamins-Minerals (MULTIVITAMIN ADULT [employer]is) Take by mouth.   Probiotic Product (PROBIOTIC DAILY) CAPS Take 1 capsule by  mouth daily with supper.    sacubitril-valsartan (ENTRESTO) 24-26 MG Take 1 tablet by mouth 2 (two) times daily.   XARELTO 20 MG TABS tablet Take 20 mg by mouth daily.     Allergies:   Crestor [rosuvastatin calcium], Other, Benadryl [diphenhydramine hcl], Canagliflozin, Liraglutide, Metformin, Pravastatin, and Tramadol   Social History   Tobacco Use   Smoking status: Former Smoker    Packs/day: 0.10    Years: 2.00    Pack years: 0.20    Types: Cigarettes    Quit date: 10/02/1979    Years since quitting: 39.7   Smokeless tobacco: Never Used  Substance Use Topics   Alcohol use: No    Alcohol/week: 0.0 standard drinks   Drug use: No     Family Hx: The patient's family history includes Alzheimer's disease in her maternal aunt and mother; Atrial fibrillation in her mother; Breast cancer in her maternal aunt; Cancer in her maternal grandfather; Diabetes in her maternal grandmother; Heart  disease in her father, maternal grandmother, mother, paternal grandfather, and paternal grandmother; Hypertension in her mother; Mitral valve prolapse in her sister.  ROS:   Please see the history of present illness.     All other systems reviewed and are negative.   Prior CV studies:   The following studies were reviewed today:  2D echocardiogram performed 12/16/2017  Labs/Other Tests and Data Reviewed:    EKG:  No ECG reviewed.  Recent Labs: No results found for requested labs within last 8760 hours.   Recent Lipid Panel No results found for: CHOL, TRIG, HDL, CHOLHDL, LDLCALC, LDLDIRECT  Wt Readings from Last 3 Encounters:  06/10/19 (!) 335 lb (152 kg)  06/08/19 (!) 335 lb (152 kg)  12/23/18 (!) 337 lb (152.9 kg)     Objective:    Vital Signs:  BP 118/62    Pulse 67    Ht 5\' 9"  (1.753 m)    Wt (!) 335 lb (152 kg)    LMP 08/07/2016    BMI 49.47 kg/m    VITAL SIGNS:  reviewed a complete physical exam was not performed today since this was a virtual telemedicine phone visit  ASSESSMENT & PLAN:    1. Nonischemic cardiomyopathy- history of nonischemic cardiomyopathy with an EF of 35% by Myoview 2/18 done for preoperative clearance prior to bariatric surgery.  Cardiac catheterization performed the following month revealed normal coronary arteries.  She ultimately had a biventricular ICD placed by Dr. Lovena Le in August 2018 resulting in marked improvement in her ejection fraction by 2D echo 12/16/2017 up to normal.  She is clinically improved as well.  She is on carvedilol, Entresto and hydrochlorothiazide. 2. Essential hypertension- history of essential hypertension with blood pressure measured today 118/62.  She is on carvedilol and Entresto 3. Hyperlipidemia- history of hyperlipidemia intolerant to statin therapy.  Recent lipid profile performed 12/18/2018 revealed total cholesterol 194, LDL 122 and HDL of 34.  I am going to begin her on Zetia and will recheck in 2 to 3 months.  We  talked about the importance of dietary modification and compliance 4. Obstructive sleep apnea-on CPAP 5. DVT- history of DVT diagnosed by Dr. Carnella Guadal[place]ara 3/20 on Xarelto.  She does have factor V Leiden deficiency and has had DVT and pulmonary emboli in the past.  I suspect she has been on oral anticoagulation indefinitely.  COVID-19 Education: The signs and symptoms of COVID-19 were discussed with the patient and how to seek care for testing (follow up with PCP  or arrange E-visit).  The importance of social distancing was discussed today.  Time:   Today, I have spent 9 minutes with the patient with telehealth technology discussing the above problems.     Medication Adjustments/Labs and Tests Ordered: Current medicines are reviewed at length with the patient today.  Concerns regarding medicines are outlined above.   Tests Ordered: No orders of the defined types were placed in this encounter.   Medication Changes: No orders of the defined types were placed in this encounter.   Follow Up:  In Person in 6 week(s)  Signed, Quay Burow, MD  06/10/2019 9:35 AM    [place]

## 2019-06-10 NOTE — Telephone Encounter (Signed)
Patient and/or DPR-approved person aware of 8/26 AVS instructions and verbalized understanding. She states she will not be taking the Zetia because she read that it causes muscle aches. She requested refills of her carvedilol and Entresto  AVS to be released to [employer]

## 2019-06-14 NOTE — Progress Notes (Deleted)
Cardiology Office Note Date:  06/14/2019  Patient ID:  Anecia, Serven July 17, 1961, MRN [id number] PCP:  Reynold Bowen, MD  Cardiologist:  Dr. Gwenlyn Found Electrophysiologist: Dr. Lovena Le  ***refresh   Chief Complaint: *** annual  History of Present Illness: Wilmary Paulick is a 58 y.o. female with history of morbid obesity, DM, HTN, HLD, factor V Leiden deficiency, h/o DVT and PE (2011), NICM, , normal Coronaries by cath in 2018, chronic CHF (systolic), LBBB  >>  CRT-D.  She come sin today to be seen for Dr. Lovena Le.  Last seen by him 06/2018, device function was intact, no programming changes were made.  She had a tele-health visit with Dr. Gwenlyn Found 06/10/2019, she was doing well, no changes were made to her regime.  *** meds, CM *** voume status *** symptoms, shocks, syncope *** labs *** xarelto, bleeding    Device information: BSCi CRT-D, implanted 06/26/17    Past Medical History:  Diagnosis Date  . Abnormal uterine bleeding (AUB)   . AICD (automatic cardioverter/defibrillator) present   . At risk for sleep apnea    STOP-BANG=  5       SENT TO PCP 10-01-2016  . Blood clot in vein    "superficial blood clots in BLE; I had 6-7 since 2000" (06/26/2017)  . BMI 45.0-49.9, adult ([place])   . Chronic bronchitis ([place])   . Diverticulosis of colon   . Endometrial polyp   . Factor 5 Leiden mutation, heterozygous ([place])    dx 2011  . GERD (gastroesophageal reflux disease)   . High cholesterol   . History of DVT of lower extremity 2000; 2011   "I've had them in both"  . History of ectopic pregnancy 1998  . Hypertension   . Hypothyroidism   . Left bundle branch block (LBBB)   . Osteoarthritis    "all over" (06/26/2017)  . Peripheral neuropathy   . PONV (postoperative nausea and vomiting)   . Type 2 diabetes mellitus treated with insulin [facility])     Past Surgical History:  Procedure Laterality Date  . BI-VENTRICULAR IMPLANTABLE CARDIOVERTER DEFIBRILLATOR  (CRT-D)  06/26/2017  . BIV  ICD INSERTION CRT-D N/A 06/26/2017   Procedure: BIV ICD INSERTION CRT-D;  Surgeon: Evans Lance, MD;  Location: [place] [facility];  Service: Cardiovascular;  Laterality: N/A;  . COLONOSCOPY  2013  . DILATATION & CURETTAGE/HYSTEROSCOPY WITH MYOSURE N/A 10/05/2016   Procedure: [place];  Surgeon: Anastasio Auerbach, MD;  Location: [place];  Service: Gynecology;  Laterality: N/A;  requests to follow 7:30am caserequests one hour OR time  . DILATION AND CURETTAGE OF UTERUS  12/06/2007  . LAPAROSCOPY BILATERAL TUBAL LIGATION WITH FALOPE RINGS/  D&C HYSTEROSCOPY WITH POLYPECTOMY  12/06/2007   and Lysis Adhesions  . LEFT HEART CATH AND CORONARY ANGIOGRAPHY N/A 01/03/2017   Procedure: Left Heart Cath and Coronary Angiography;  Surgeon: Lorretta Harp, MD;  Location: [place] [facility];  Service: Cardiovascular;  Laterality: N/A;  . TUBAL LIGATION  12/06/2007    Current Outpatient Medications  Medication Sig Dispense Refill  . calcium carbonate (TUMS - DOSED IN MG ELEMENTAL CALCIUM) 500 MG chewable tablet Chew 1 tablet by mouth daily as needed for indigestion or heartburn.    . carvedilol (COREG) 3.125 MG tablet Take 1 tablet (3.125 mg total) by mouth 2 (two) times daily. 180 tablet 3  . Cholecalciferol (VITAMIN D3) 2000 UNITS capsule Take 4,000 Units by mouth daily with supper.     [name]  diclofenac sodium (VOLTAREN) 1 % GEL Apply 2 g topically 4 (four) times daily.     [name] esomeprazole (NEXIUM) 40 MG capsule Take by mouth daily.  5  . ezetimibe (ZETIA) 10 MG tablet Take 1 tablet (10 mg total) by mouth daily. 90 tablet 3  . fexofenadine (ALLEGRA) 180 MG tablet Take 180 mg by mouth daily as needed for allergies or rhinitis.    [name] gabapentin (NEURONTIN) 100 MG capsule Take 300 mg by mouth at bedtime. TAKE ONE TABLET IN THE MORNING AND TWO TABLETS AT NIGHT.    [name] hydrochlorothiazide (HYDRODIURIL) 12.5 MG tablet Take 12.5 mg by mouth daily as needed  (fluid).     . insulin aspart (NOVOLOG) 100 UNIT/ML injection Inject 7-10 Units into the skin 3 (three) times daily as needed for high blood sugar. Per SSC 7-10units when cbg above 120    . insulin detemir (LEVEMIR) 100 UNIT/ML injection Inject 36 Units into the skin at bedtime.     . Iron-Vitamins (GERITOL COMPLETE) TABS Take 1 tablet by mouth daily with supper.     . levothyroxine (SYNTHROID, LEVOTHROID) 75 MCG tablet Take 75 mcg by mouth daily before breakfast.     . Menthol, Topical Analgesic, (BENGAY EX) Apply 1 application topically 2 (two) times daily as needed (pain).    . Multiple Vitamins-Minerals (MULTIVITAMIN ADULT PO) Take by mouth.    . Probiotic Product (PROBIOTIC DAILY) CAPS Take 1 capsule by mouth daily with supper.     . sacubitril-valsartan (ENTRESTO) 24-26 MG Take 1 tablet by mouth 2 (two) times daily. 180 tablet 3  . XARELTO 20 MG TABS tablet Take 20 mg by mouth daily.     No current facility-administered medications for this visit.    Facility-Administered Medications Ordered in Other Visits  Medication Dose Route Frequency Provider Last Rate Last Dose  . technetium tetrofosmin (TC-MYOVIEW) injection [id number] millicurie  [id number] millicurie Intravenous Once PRN Hilty, Nadean Corwin, MD        Allergies:   Crestor [rosuvastatin calcium], Other, Benadryl [diphenhydramine hcl], Canagliflozin, Liraglutide, Metformin, Pravastatin, and Tramadol   Social History:  The patient  reports that she quit smoking about 39 years ago. Her smoking use included cigarettes. She has a 0.20 pack-year smoking history. She has never used smokeless tobacco. She reports that she does not drink alcohol or use drugs.   Family History:  The patient's family history includes Alzheimer's disease in her maternal aunt and mother; Atrial fibrillation in her mother; Breast cancer in her maternal aunt; Cancer in her maternal grandfather; Diabetes in her maternal grandmother; Heart disease in her father, maternal  grandmother, mother, paternal grandfather, and paternal grandmother; Hypertension in her mother; Mitral valve prolapse in her sister.  ROS:  Please see the history of present illness.  All other systems are reviewed and otherwise negative.   PHYSICAL EXAM: *** VS:  LMP 08/07/2016  BMI: There is no height or weight on file to calculate BMI. Well nourished, well developed, in no acute distress  HEENT: normocephalic, atraumatic  Neck: no JVD, carotid bruits or masses Cardiac:  *** RRR; no significant murmurs, no rubs, or gallops Lungs:  *** CTA b/l, no wheezing, rhonchi or rales  Abd: soft, nontender, obese MS: no deformity or *** atrophy Ext: *** no edema  Skin: warm and dry, no rash Neuro:  No gross deficits appreciated Psych: euthymic mood, full affect  *** ICD site is stable, no tethering or discomfort   EKG:  Not done today ICD interrogation  done today and reviewed by myself: ***   12/16/17: TTE Study Conclusions - Left ventricle: The cavity size was mildly dilated. Wall   thickness was increased in a pattern of mild LVH. Systolic   function was normal. The estimated ejection fraction was in the   range of 55% to 60%. Wall motion was normal; there were no   regional wall motion abnormalities. Doppler parameters are   consistent with abnormal left ventricular relaxation (grade 1   diastolic dysfunction). - Ascending aorta: The ascending aorta was mildly dilated. - Right ventricle: The cavity size was mildly dilated.  Impressions: - Normal LV systolic function; mild diastolic dysfunction; mild   LVH; mild LVE; mildly dilated ascending aorta; mildly dilated RV.    01/03/17: [facility] IMPRESSION:Ms Son Has normal coronary arteries and severely depressed LV function. She has a nonischemic cardiomyopathy.   Recent Labs: No results found for requested labs within last 8760 hours.  No results found for requested labs within last 8760 hours.   CrCl cannot be calculated  (Patient's most recent lab result is older than the maximum 21 days allowed.).   Wt Readings from Last 3 Encounters:  06/10/19 (!) 335 lb (152 kg)  06/08/19 (!) 335 lb (152 kg)  12/23/18 (!) 337 lb (152.9 kg)     Other studies reviewed: Additional studies/records reviewed today include: summarized above  ASSESSMENT AND PLAN:  1. CRT-D     ***  2. NICM 3. Chronic CHF (systolic)     ***  4. HTN     ***    Disposition: F/u with ***  Current medicines are reviewed at length with the patient today.  The patient did not have any concerns regarding medicines.***  Venetia Night, PA-C 06/14/2019 6:09 PM     [place] [address] [place] [address] [phone] (office)  [phone] (fax)

## 2019-06-16 ENCOUNTER — Encounter: Payer: 59 | Admitting: Physician Assistant

## 2019-07-07 ENCOUNTER — Ambulatory Visit (INDEPENDENT_AMBULATORY_CARE_PROVIDER_SITE_OTHER): Payer: 59 | Admitting: *Deleted

## 2019-07-07 DIAGNOSIS — I5022 Chronic systolic (congestive) heart failure: Secondary | ICD-10-CM

## 2019-07-07 DIAGNOSIS — I428 Other cardiomyopathies: Secondary | ICD-10-CM | POA: Diagnosis not present

## 2019-07-09 LAB — CUP PACEART REMOTE DEVICE CHECK
Date Time Interrogation Session: 20200924072710
Implantable Lead Implant Date: 20180912
Implantable Lead Implant Date: 20180912
Implantable Lead Implant Date: 20180912
Implantable Lead Location: 753858
Implantable Lead Location: 753859
Implantable Lead Location: 753860
Implantable Lead Model: 293
Implantable Lead Model: 4674
Implantable Lead Model: 7741
Implantable Lead Serial Number: 435131
Implantable Lead Serial Number: 813037
Implantable Lead Serial Number: 920623
Implantable Pulse Generator Implant Date: 20180912
Pulse Gen Serial Number: 193993

## 2019-07-14 ENCOUNTER — Encounter: Payer: Self-pay | Admitting: Gynecology

## 2019-07-16 NOTE — Progress Notes (Signed)
Remote ICD transmission.   

## 2019-07-21 ENCOUNTER — Telehealth: Payer: Self-pay

## 2019-07-21 NOTE — Telephone Encounter (Signed)
Left message for patient to remind of missed remote transmission.  

## 2019-07-22 NOTE — Progress Notes (Signed)
Cardiology Office Note Date:  07/23/2019  Patient ID:  Monica, Solis 06-Mar-1961, MRN [id number] PCP:  Reynold Bowen, MD  Cardiologist:  Dr. Gwenlyn Found Electrophysiologist: Dr. Lovena Le    Chief Complaint:  annual EP  visit  History of Present Illness: Monica Solis is a 58 y.o. female with history of Factor V Leiden deficiency, h/o DVT/PE (2011), NICM, chronic CHF (systolic), CRT-D, normalization of her LVEF, HTN, HLD, DM, obesity.  She is doing very well.  Denies any concerns, symptoms.  Requests the flu shot while she is here.    She denies any CP, palpitations or SOB.  No changes to her exertional acpacity.  No near syncope or syncope.  Rarely she feels a very fleeting/momentary dizziness, this dates back years and is if anything much improved in the last couple years.  No bleeding or signs of bleeding with her xarelto.  She mentions Dr. Gwenlyn Found had wanted to start her on Zetia, though she declined, reporting significant intolerance to statins and though not the same family of drug, does not want it..   Device information BSci CRT-D, implanted, 06/26/17   Past Medical History:  Diagnosis Date  . Abnormal uterine bleeding (AUB)   . AICD (automatic cardioverter/defibrillator) present   . At risk for sleep apnea    STOP-BANG=  5       SENT TO PCP 10-01-2016  . Blood clot in vein    "superficial blood clots in BLE; I had 6-7 since 2000" (06/26/2017)  . BMI 45.0-49.9, adult ([place])   . Chronic bronchitis ([place])   . Diverticulosis of colon   . Endometrial polyp   . Factor 5 Leiden mutation, heterozygous ([place])    dx 2011  . GERD (gastroesophageal reflux disease)   . High cholesterol   . History of DVT of lower extremity 2000; 2011   "I've had them in both"  . History of ectopic pregnancy 1998  . Hypertension   . Hypothyroidism   . Left bundle branch block (LBBB)   . Osteoarthritis    "all over" (06/26/2017)  . Peripheral neuropathy   . PONV (postoperative nausea and vomiting)    . Type 2 diabetes mellitus treated with insulin [facility])     Past Surgical History:  Procedure Laterality Date  . BI-VENTRICULAR IMPLANTABLE CARDIOVERTER DEFIBRILLATOR  (CRT-D)  06/26/2017  . BIV ICD INSERTION CRT-D N/A 06/26/2017   Procedure: BIV ICD INSERTION CRT-D;  Surgeon: Evans Lance, MD;  Location: [place] [facility];  Service: Cardiovascular;  Laterality: N/A;  . COLONOSCOPY  2013  . DILATATION & CURETTAGE/HYSTEROSCOPY WITH MYOSURE N/A 10/05/2016   Procedure: [place];  Surgeon: Anastasio Auerbach, MD;  Location: [facility];  Service: Gynecology;  Laterality: N/A;  requests to follow 7:30am caserequests one hour OR time  . DILATION AND CURETTAGE OF UTERUS  12/06/2007  . LAPAROSCOPY BILATERAL TUBAL LIGATION WITH FALOPE RINGS/  D&C HYSTEROSCOPY WITH POLYPECTOMY  12/06/2007   and Lysis Adhesions  . LEFT HEART CATH AND CORONARY ANGIOGRAPHY N/A 01/03/2017   Procedure: Left Heart Cath and Coronary Angiography;  Surgeon: Lorretta Harp, MD;  Location: [place] [facility];  Service: Cardiovascular;  Laterality: N/A;  . TUBAL LIGATION  12/06/2007    Current Outpatient Medications  Medication Sig Dispense Refill  . calcium carbonate (TUMS - DOSED IN MG ELEMENTAL CALCIUM) 500 MG chewable tablet Chew 1 tablet by mouth daily as needed for indigestion or heartburn.    . carvedilol (COREG) 3.125  MG tablet Take 1 tablet (3.125 mg total) by mouth 2 (two) times daily. 180 tablet 3  . Cholecalciferol (VITAMIN D3) 2000 UNITS capsule Take 4,000 Units by mouth daily with supper.     . diclofenac sodium (VOLTAREN) 1 % GEL Apply 2 g topically 4 (four) times daily.     [name] esomeprazole (NEXIUM) 40 MG capsule Take by mouth daily.  5  . fexofenadine (ALLEGRA) 180 MG tablet Take 180 mg by mouth daily as needed for allergies or rhinitis.    [name] gabapentin (NEURONTIN) 100 MG capsule Take 300 mg by mouth at bedtime. TAKE ONE TABLET IN THE MORNING AND TWO  TABLETS AT NIGHT.    [name] hydrochlorothiazide (HYDRODIURIL) 12.5 MG tablet Take 1 tablet (12.5 mg total) by mouth daily as needed (fluid). 30 tablet 1  . insulin aspart (NOVOLOG) 100 UNIT/ML injection Inject 7-10 Units into the skin 3 (three) times daily as needed for high blood sugar. Per SSC 7-10units when cbg above 120    . insulin detemir (LEVEMIR) 100 UNIT/ML injection Inject 36 Units into the skin at bedtime.     . Iron-Vitamins (GERITOL COMPLETE) TABS Take 1 tablet by mouth daily with supper.     . levothyroxine (SYNTHROID, LEVOTHROID) 75 MCG tablet Take 75 mcg by mouth daily before breakfast.     . Menthol, Topical Analgesic, (BENGAY EX) Apply 1 application topically 2 (two) times daily as needed (pain).    . Multiple Vitamins-Minerals (MULTIVITAMIN ADULT PO) Take by mouth.    . Probiotic Product (PROBIOTIC DAILY) CAPS Take 1 capsule by mouth daily with supper.     . sacubitril-valsartan (ENTRESTO) 24-26 MG Take 1 tablet by mouth 2 (two) times daily. 180 tablet 3  . XARELTO 20 MG TABS tablet Take 20 mg by mouth daily.     No current facility-administered medications for this visit.    Facility-Administered Medications Ordered in Other Visits  Medication Dose Route Frequency Provider Last Rate Last Dose  . technetium tetrofosmin (TC-MYOVIEW) injection [id number] millicurie  [id number] millicurie Intravenous Once PRN Hilty, Nadean Corwin, MD        Allergies:   Crestor [rosuvastatin calcium], Other, Benadryl [diphenhydramine hcl], Canagliflozin, Liraglutide, Metformin, Pravastatin, and Tramadol   Social History:  The patient  reports that she quit smoking about 39 years ago. Her smoking use included cigarettes. She has a 0.20 pack-year smoking history. She has never used smokeless tobacco. She reports that she does not drink alcohol or use drugs.   Family History:  The patient's family history includes Alzheimer's disease in her maternal aunt and mother; Atrial fibrillation in her mother; Breast cancer in  her maternal aunt; Cancer in her maternal grandfather; Diabetes in her maternal grandmother; Heart disease in her father, maternal grandmother, mother, paternal grandfather, and paternal grandmother; Hypertension in her mother; Mitral valve prolapse in her sister.  ROS:  Please see the history of present illness.  All other systems are reviewed and otherwise negative.   PHYSICAL EXAM:  VS:  BP 118/88   Pulse 75   Ht 5\' 9"  (1.753 m)   Wt (!) 332 lb (150.6 kg)   LMP 08/07/2016   BMI 49.03 kg/m  BMI: Body mass index is 49.03 kg/m. Well nourished, well developed, in no acute distress  HEENT: normocephalic, atraumatic  Neck: no JVD, carotid bruits or masses Cardiac:  RRR; no significant murmurs, no rubs, or gallops Lungs:  CTA b/l, no wheezing, rhonchi or rales  Abd: soft, nontender, obese MS: no deformity or  atrophy Ext: no edema  Skin: warm and dry, no rash Neuro:  No gross deficits appreciated Psych: euthymic mood, full affect  ICD site is stable, no tethering or discomfort   EKG:  Not done today ICD interrogation done today and reviewed by myself: battery and lead testing look OK, LV lead impedance is up slightly from last year. Presents AS/LVP 100% LVP NSVT is a an AT one secon ATR look an AT/aflutter, are <1 minute, burden <1%  12/16/17: TTE Study Conclusions - Left ventricle: The cavity size was mildly dilated. Wall   thickness was increased in a pattern of mild LVH. Systolic   function was normal. The estimated ejection fraction was in the   range of 55% to 60%. Wall motion was normal; there were no   regional wall motion abnormalities. Doppler parameters are   consistent with abnormal left ventricular relaxation (grade 1   diastolic dysfunction). - Ascending aorta: The ascending aorta was mildly dilated. - Right ventricle: The cavity size was mildly dilated.  Impressions: - Normal LV systolic function; mild diastolic dysfunction; mild   LVH; mild LVE; mildly  dilated ascending aorta; mildly dilated RV.  05/17/17: TTE: LVEF 30-35% 12/03/16: TTE: 30-35%   01/03/17: LHC IMPRESSION:Ms Heffley Has normal coronary arteries and severely depressed LV function. She has a nonischemic cardiomyopathy. She'll need optimal medical management. The sheath was removed and a TR band was placed on the right wrist which is patent hemostasis. The patient left the lab in stable condition. She will be discharged home later today and will follow-up with me as an outpatient in 2-3 weeks.    Recent Labs: No results found for requested labs within last 8760 hours.  No results found for requested labs within last 8760 hours.   CrCl cannot be calculated (Patient's most recent lab result is older than the maximum 21 days allowed.).   Wt Readings from Last 3 Encounters:  07/23/19 (!) 332 lb (150.6 kg)  06/10/19 (!) 335 lb (152 kg)  06/08/19 (!) 335 lb (152 kg)     Other studies reviewed: Additional studies/records reviewed today include: summarized above  ASSESSMENT A ND PLAN:  1. ICD     Intact function, no programming changes made  2. NICM with recovered LVEF     No symptoms or exam findings of fluid OL     On BB, entresto, HCTZ  3. HTN     Looks good, no changes  4. Pt requested and received flu shot today  5. HLD     Deferred to Dr. Gwenlyn Found, pt intolerant to statins and declined zetia    Disposition: F/u with  BMET and CBC today given her meds, c/w Q 3 mo remotes and annually in clinic, sooner if needed.  Current medicines are reviewed at length with the patient today.  The patient did not have any concerns regarding medicines.  Venetia Night, PA-C 07/23/2019 2:12 PM     [address] [place] [address] [phone] (office)  [phone] (fax)

## 2019-07-23 ENCOUNTER — Ambulatory Visit (INDEPENDENT_AMBULATORY_CARE_PROVIDER_SITE_OTHER): Payer: 59 | Admitting: Physician Assistant

## 2019-07-23 ENCOUNTER — Other Ambulatory Visit: Payer: Self-pay

## 2019-07-23 VITALS — BP 118/88 | HR 75 | Ht 69.0 in | Wt 332.0 lb

## 2019-07-23 DIAGNOSIS — I5022 Chronic systolic (congestive) heart failure: Secondary | ICD-10-CM

## 2019-07-23 DIAGNOSIS — Z23 Encounter for immunization: Secondary | ICD-10-CM | POA: Diagnosis not present

## 2019-07-23 DIAGNOSIS — Z79899 Other long term (current) drug therapy: Secondary | ICD-10-CM

## 2019-07-23 DIAGNOSIS — I10 Essential (primary) hypertension: Secondary | ICD-10-CM

## 2019-07-23 DIAGNOSIS — Z95810 Presence of automatic (implantable) cardiac defibrillator: Secondary | ICD-10-CM | POA: Diagnosis not present

## 2019-07-23 MED ORDER — CARVEDILOL 3.125 MG PO TABS: 3.1250 mg | ORAL_TABLET | Freq: Two times a day (BID) | ORAL | 3 refills | Status: DC

## 2019-07-23 MED ORDER — HYDROCHLOROTHIAZIDE 12.5 MG PO TABS: 12.5000 mg | ORAL_TABLET | Freq: Every day | ORAL | 1 refills | Status: DC | PRN

## 2019-07-23 MED ORDER — SACUBITRIL-VALSARTAN 24-26 MG PO TABS: 1.0000 | ORAL_TABLET | Freq: Two times a day (BID) | ORAL | 3 refills | Status: DC

## 2019-07-23 NOTE — Patient Instructions (Addendum)
Medication Instructions:   Your physician recommends that you continue on your current medications as directed. Please refer to the Current Medication list given to you today.  If you need a refill on your cardiac medications before your next appointment, please call your pharmacy.   Lab work: [place]   If you have labs (blood work) drawn today and your tests are completely normal, you will receive your results only by: [name] MyChart Message (if you have MyChart) OR . A paper copy in the mail If you have any lab test that is abnormal or we need to change your treatment, we will call you to review the results.  Testing/Procedures: NONE ORDERED  TODAY   Follow-Up: At [facility], you and your health needs are our priority.  As part of our continuing mission to provide you with exceptional heart care, we have created designated Provider Care Teams.  These Care Teams include your primary Cardiologist (physician) and Advanced Practice Providers (APPs -  Physician Assistants and Nurse Practitioners) who all work together to provide you with the care you need, when you need it. You will need a follow up appointment in 1 years.  Please call our office 2 months in advance to schedule this appointment.  You may see Dr. Caryl Comes  or one of the following Advanced Practice Providers on your designated Care Team:   Chanetta Marshall, NP . Tommye Standard, PA-C . Joesph July PA-C  Any Other Special Instructions Will Be Listed Below (If Applicable).

## 2019-07-24 LAB — CBC
Hematocrit: 44.9 % (ref 34.0–46.6)
Hemoglobin: 15.3 g/dL (ref 11.1–15.9)
MCH: 30.3 pg (ref 26.6–33.0)
MCHC: 34.1 g/dL (ref 31.5–35.7)
MCV: 89 fL (ref 79–97)
Platelets: 250 10*3/uL (ref 150–450)
RBC: 5.05 x10E6/uL (ref 3.77–5.28)
RDW: 12.2 % (ref 11.7–15.4)
WBC: 6.3 10*3/uL (ref 3.4–10.8)

## 2019-07-24 LAB — BASIC METABOLIC PANEL
BUN/Creatinine Ratio: 19 (ref 9–23)
BUN: 14 mg/dL (ref 6–24)
CO2: 22 mmol/L (ref 20–29)
Calcium: 9.4 mg/dL (ref 8.7–10.2)
Chloride: 104 mmol/L (ref 96–106)
Creatinine, Ser: 0.72 mg/dL (ref 0.57–1.00)
GFR calc Af Amer: 108 mL/min/{1.73_m2} (ref >59)
GFR calc non Af Amer: 93 mL/min/{1.73_m2} (ref >59)
Glucose: 134 mg/dL — ABNORMAL HIGH (ref 65–99)
Potassium: 4.5 mmol/L (ref 3.5–5.2)
Sodium: 141 mmol/L (ref 134–144)

## 2019-08-06 ENCOUNTER — Other Ambulatory Visit: Payer: Self-pay | Admitting: Physician Assistant

## 2019-08-10 ENCOUNTER — Ambulatory Visit (INDEPENDENT_AMBULATORY_CARE_PROVIDER_SITE_OTHER): Payer: 59

## 2019-08-10 DIAGNOSIS — Z95810 Presence of automatic (implantable) cardiac defibrillator: Secondary | ICD-10-CM | POA: Diagnosis not present

## 2019-08-10 DIAGNOSIS — I5022 Chronic systolic (congestive) heart failure: Secondary | ICD-10-CM

## 2019-08-12 NOTE — Progress Notes (Signed)
EPIC Encounter for ICM Monitoring  Patient Name: Monica Solis is a 58 y.o. female Date: 08/12/2019 Primary Care Physican: Reynold Bowen, MD Primary Cardiologist:Berry Electrophysiologist:Taylor 07/23/2019 Weight: 332 lbs  Transmission reviewed.  08/10/2019 HeartLogic Heart Failure Index is5which is within normal threshold range.  Prescribed:Hydrochlorothiazide 25 mgTake 12.5 mg by mouth daily as needed (fluid).  Labs: 07/23/2019 Creatinine 0.72, BUN 14, Potassium 4.5, Sodium 141, GFR 93-108  Recommendations: None  Follow-up plan: ICM clinic phone appointment on 09/21/2019.   91 day device clinic remote transmission 10/06/2019.          Copy of ICM check sent to Dr. Lovena Le.    [name], RN 08/12/2019 3:16 PM

## 2019-09-21 ENCOUNTER — Ambulatory Visit (INDEPENDENT_AMBULATORY_CARE_PROVIDER_SITE_OTHER): Payer: 59

## 2019-09-21 DIAGNOSIS — Z95810 Presence of automatic (implantable) cardiac defibrillator: Secondary | ICD-10-CM

## 2019-09-21 DIAGNOSIS — I5022 Chronic systolic (congestive) heart failure: Secondary | ICD-10-CM

## 2019-09-23 NOTE — Progress Notes (Signed)
EPIC Encounter for ICM Monitoring  Patient Name: Monica Solis is a 58 y.o. female Date: 09/23/2019 Primary Care Physican: Reynold Bowen, MD Primary Cardiologist:Berry Electrophysiologist:Taylor 09/23/2019 Weight: 329.4 lbs  Spoke with patient.  She reports taking Hydrochlorothiazide at least a couple of times a week due to intermittent swelling of knees.  09/20/2019 HeartLogic Heart Failure Index is0which is within normal threshold range.  Prescribed:Hydrochlorothiazide 12.5 mgTake 12.5 mg by mouth daily as needed (fluid).  Labs: 07/23/2019 Creatinine 0.72, BUN 14, Potassium 4.5, Sodium 141, GFR 93-108  Recommendations:  No changes and encouraged to call if experiencing any fluid symptoms.  Follow-up plan: ICM clinic phone appointment on 10/26/2019.   91 day device clinic remote transmission 10/06/2019.          Copy of ICM check sent to Dr. Lovena Le.     [place], RN 09/23/2019 8:50 AM

## 2019-10-06 ENCOUNTER — Ambulatory Visit (INDEPENDENT_AMBULATORY_CARE_PROVIDER_SITE_OTHER): Payer: 59 | Admitting: *Deleted

## 2019-10-06 DIAGNOSIS — I428 Other cardiomyopathies: Secondary | ICD-10-CM | POA: Diagnosis not present

## 2019-10-06 LAB — CUP PACEART REMOTE DEVICE CHECK
Date Time Interrogation Session: 20201222054501
Implantable Lead Implant Date: 20180912
Implantable Lead Implant Date: 20180912
Implantable Lead Implant Date: 20180912
Implantable Lead Location: 753858
Implantable Lead Location: 753859
Implantable Lead Location: 753860
Implantable Lead Model: 293
Implantable Lead Model: 4674
Implantable Lead Model: 7741
Implantable Lead Serial Number: 435131
Implantable Lead Serial Number: 813037
Implantable Lead Serial Number: 920623
Implantable Pulse Generator Implant Date: 20180912
Pulse Gen Serial Number: 193993

## 2019-10-26 ENCOUNTER — Ambulatory Visit (INDEPENDENT_AMBULATORY_CARE_PROVIDER_SITE_OTHER): Payer: 59

## 2019-10-26 DIAGNOSIS — Z95810 Presence of automatic (implantable) cardiac defibrillator: Secondary | ICD-10-CM | POA: Diagnosis not present

## 2019-10-26 DIAGNOSIS — I5022 Chronic systolic (congestive) heart failure: Secondary | ICD-10-CM | POA: Diagnosis not present

## 2019-10-30 ENCOUNTER — Telehealth: Payer: Self-pay

## 2019-10-30 NOTE — Progress Notes (Signed)
EPIC Encounter for ICM Monitoring  Patient Name: Monica Solis is a 59 y.o. female Date: 10/30/2019 Primary Care Physican: Reynold Bowen, MD Primary Cardiologist:Berry Electrophysiologist:Taylor 09/23/2019 Weight: 329.4lbs  Attempted call to patient and unable to reach.  Left detailed message per DPR regarding transmission. Transmission reviewed.   10/24/2018 HeartLogic HF Index is2 which is within normal threshold range.  Prescribed:Hydrochlorothiazide 12.5 mgTake 12.5 mg by mouth daily as needed (fluid).  Labs: 07/23/2019 Creatinine 0.72, BUN 14, Potassium 4.5, Sodium 141, GFR 93-108  Recommendations: Left voice mail with ICM number and encouraged to call if experiencing any fluid symptoms.  Follow-up plan: ICM clinic phone appointment on 11/30/2019.   91 day device clinic remote transmission 01/05/2020.  Office appt 12/16/2019 with Kerin Ransom, PA.          Copy of ICM check sent to Dr. Lovena Le.  3 Month Trend    8 Day Data Trend          [name], RN 10/30/2019 11:10 AM

## 2019-10-30 NOTE — Telephone Encounter (Signed)
Remote ICM transmission received.  Attempted call to patient regarding ICM remote transmission and left detailed message per DPR.  Advised to return call for any fluid symptoms or questions. Next ICM remote transmission scheduled 11/30/2019.     

## 2019-12-07 ENCOUNTER — Telehealth: Payer: 59 | Admitting: Cardiology

## 2019-12-16 ENCOUNTER — Encounter: Payer: Self-pay | Admitting: Cardiology

## 2019-12-16 ENCOUNTER — Telehealth (INDEPENDENT_AMBULATORY_CARE_PROVIDER_SITE_OTHER): Payer: 59 | Admitting: Cardiology

## 2019-12-16 ENCOUNTER — Ambulatory Visit (INDEPENDENT_AMBULATORY_CARE_PROVIDER_SITE_OTHER): Payer: 59

## 2019-12-16 VITALS — BP 131/79 | HR 68 | Ht 69.0 in | Wt 335.0 lb

## 2019-12-16 DIAGNOSIS — I10 Essential (primary) hypertension: Secondary | ICD-10-CM

## 2019-12-16 DIAGNOSIS — I5022 Chronic systolic (congestive) heart failure: Secondary | ICD-10-CM

## 2019-12-16 DIAGNOSIS — Z6841 Body Mass Index (BMI) 40.0 and over, adult: Secondary | ICD-10-CM

## 2019-12-16 DIAGNOSIS — I519 Heart disease, unspecified: Secondary | ICD-10-CM

## 2019-12-16 DIAGNOSIS — D6851 Activated protein C resistance: Secondary | ICD-10-CM

## 2019-12-16 DIAGNOSIS — Z95810 Presence of automatic (implantable) cardiac defibrillator: Secondary | ICD-10-CM | POA: Diagnosis not present

## 2019-12-16 DIAGNOSIS — I428 Other cardiomyopathies: Secondary | ICD-10-CM | POA: Diagnosis not present

## 2019-12-16 NOTE — Progress Notes (Signed)
EPIC Encounter for ICM Monitoring  Patient Name: Monica Solis is a 59 y.o. female Date: 12/16/2019 Primary Care Physican: Reynold Bowen, MD Primary Cardiologist:Berry Electrophysiologist:Taylor 09/23/2019 Weight: 329.4lbs  Transmission reviewed.   12/14/2019 HeartLogic HF Index is1 which is within normal threshold range.  Prescribed:Hydrochlorothiazide12.5mg Take 12.5 mg by mouth daily as needed (fluid).  Labs: 07/23/2019 Creatinine 0.72, BUN 14, Potassium 4.5, Sodium 141, GFR 93-108  Recommendations: None  Follow-up plan: ICM clinic phone appointment on 01/18/2020.   91 day device clinic remote transmission 01/05/2020.  Office appt 12/16/2019 with Kerin Ransom, PA.          Copy of ICM check sent to Dr. Lovena Le.  3 Month Trend    8 Day Data Trend          [name], RN 12/16/2019 9:29 AM

## 2019-12-16 NOTE — Patient Instructions (Signed)
Medication Instructions:  Your physician recommends that you continue on your current medications as directed. Please refer to the Current Medication list given to you today.  *If you need a refill on your cardiac medications before your next appointment, please call your pharmacy*   Lab Work: NONE  Testing/Procedures: NONE   Follow-Up: At [facility], you and your health needs are our priority.  As part of our continuing mission to provide you with exceptional heart care, we have created designated Provider Care Teams.  These Care Teams include your primary Cardiologist (physician) and Advanced Practice Providers (APPs -  Physician Assistants and Nurse Practitioners) who all work together to provide you with the care you need, when you need it.  We recommend signing up for the patient portal called "MyChart".  Sign up information is provided on this After Visit Summary.  MyChart is used to connect with patients for Virtual Visits (Telemedicine).  Patients are able to view lab/test results, encounter notes, upcoming appointments, etc.  Non-urgent messages can be sent to your provider as well.   To learn more about what you can do with MyChart, go to [email or web address].    Your next appointment:   6 month(s)  The format for your next appointment:   In Person  Provider:   Quay Burow, MD

## 2019-12-16 NOTE — Progress Notes (Signed)
Virtual Visit via Telephone Note   This visit type was conducted due to national recommendations for restrictions regarding the COVID-19 Pandemic (e.g. social distancing) in an effort to limit this patient's exposure and mitigate transmission in our community.  Due to her co-morbid illnesses, this patient is at least at moderate risk for complications without adequate follow up.  This format is felt to be most appropriate for this patient at this time.  The patient did not have access to video technology/had technical difficulties with video requiring transitioning to audio format only (telephone).  All issues noted in this document were discussed and addressed.  No physical exam could be performed with this format.  Please refer to the patient's chart for her  consent to telehealth for [facility].   Date:  12/16/2019   ID:  Monica Solis, DOB Oct 13, 1961, MRN [id number]  Patient Location: Home Provider Location: Home  PCP:  Reynold Bowen, MD  Cardiologist:  Dr Gwenlyn Found Electrophysiologist:  Dr Lovena Le  Evaluation Performed:  Follow-Up Visit  Chief Complaint:  none  History of Present Illness:    Monica Solis is a pleasant, morbidly obese 59 y.o. female who was seen for preop clearance prior to bariatric surgery in January 2018.  A Myoview study was done which was abnormal showing an ejection fraction of 35%.  Diagnostic catheterization was done March 2018 and this revealed normal coronaries.  Follow-up echocardiogram in August 2018 showed an ejection fraction of 30 to 35%.  In September 2018 she underwent BiV ICD implant by Dr. Lovena Le.  Follow-up echocardiogram March 2019 showed her EF to be improved to 60%.  She never had her bariatric surgery.  She is really done well from a cardiac standpoint since.  Up titration of her medications has been limited by hypotension.  Patient was contacted today for routine follow-up.  She is done well from a cardiac standpoint she denies any unusual chest  pain or shortness of breath.  She had considered once again bariatric surgery but is told me she will not have this now.  She did say she has a dry cough in the evening and wondered if it was not from [place].  She tells me its not too troublesome currently and I encouraged her to stay on it.  She does have sleep apnea and has compliance most nights but admits that she frequently takes her mask off in the middle the night.  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  This was started in March 2020 after she presented with a DVT.  Other medical issues include insulin-dependent diabetes with neuropathy.  The patient does not have symptoms concerning for COVID-19 infection (fever, chills, cough, or new shortness of breath).    Past Medical History:  Diagnosis Date  . Abnormal uterine bleeding (AUB)   . AICD (automatic cardioverter/defibrillator) present   . At risk for sleep apnea    STOP-BANG=  5       SENT TO PCP 10-01-2016  . Blood clot in vein    "superficial blood clots in BLE; I had 6-7 since 2000" (06/26/2017)  . BMI 45.0-49.9, adult ([place])   . Chronic bronchitis ([place])   . Diverticulosis of colon   . Endometrial polyp   . Factor 5 Leiden mutation, heterozygous ([place])    dx 2011  . GERD (gastroesophageal reflux disease)   . High cholesterol   . History of DVT of lower extremity 2000; 2011   "I've had them in both"  .  History of ectopic pregnancy 1998  . Hypertension   . Hypothyroidism   . Left bundle branch block (LBBB)   . Osteoarthritis    "all over" (06/26/2017)  . Peripheral neuropathy   . PONV (postoperative nausea and vomiting)   . Type 2 diabetes mellitus treated with insulin [facility])    Past Surgical History:  Procedure Laterality Date  . BI-VENTRICULAR IMPLANTABLE CARDIOVERTER DEFIBRILLATOR  (CRT-D)  06/26/2017  . BIV ICD INSERTION CRT-D N/A 06/26/2017   Procedure: BIV ICD INSERTION CRT-D;  Surgeon: Evans Lance, MD;  Location: [place] [facility];   Service: Cardiovascular;  Laterality: N/A;  . COLONOSCOPY  2013  . DILATATION & CURETTAGE/HYSTEROSCOPY WITH MYOSURE N/A 10/05/2016   Procedure: [place];  Surgeon: Anastasio Auerbach, MD;  Location: [place];  Service: Gynecology;  Laterality: N/A;  requests to follow 7:30am caserequests one hour OR time  . DILATION AND CURETTAGE OF UTERUS  12/06/2007  . LAPAROSCOPY BILATERAL TUBAL LIGATION WITH FALOPE RINGS/  D&C HYSTEROSCOPY WITH POLYPECTOMY  12/06/2007   and [place] Adhesions  . LEFT HEART CATH AND CORONARY ANGIOGRAPHY N/A 01/03/2017   Procedure: Left Heart Cath and Coronary Angiography;  Surgeon: Lorretta Harp, MD;  Location: [place] [facility];  Service: Cardiovascular;  Laterality: N/A;  . TUBAL LIGATION  12/06/2007     Current Meds  Medication Sig  . calcium carbonate (TUMS - DOSED IN MG ELEMENTAL CALCIUM) 500 MG chewable tablet Chew 1 tablet by mouth daily as needed for indigestion or heartburn.  . carvedilol (COREG) 3.125 MG tablet Take 1 tablet (3.125 mg total) by mouth 2 (two) times daily.  . Cholecalciferol (VITAMIN D3) 2000 UNITS capsule Take 4,000 Units by mouth daily with supper.   . diclofenac sodium (VOLTAREN) 1 % GEL Apply 2 g topically 4 (four) times daily.   [name] esomeprazole (NEXIUM) 40 MG capsule Take by mouth daily.  . fexofenadine (ALLEGRA) 180 MG tablet Take 180 mg by mouth daily as needed for allergies or rhinitis.  [name] gabapentin (NEURONTIN) 100 MG capsule Take 300 mg by mouth at bedtime. TAKE ONE TABLET IN THE MORNING AND TWO TABLETS AT NIGHT.  [name] hydrochlorothiazide (HYDRODIURIL) 12.5 MG tablet TAKE 1 TABLET BY MOUTH EVERY DAY AS NEEDED  . insulin aspart (NOVOLOG) 100 UNIT/ML injection Inject 7-10 Units into the skin 3 (three) times daily as needed for high blood sugar. Per SSC 7-10units when cbg above 120  . insulin detemir (LEVEMIR) 100 UNIT/ML injection Inject 40 Units into the skin at bedtime.   [name]  levothyroxine (SYNTHROID, LEVOTHROID) 75 MCG tablet Take 75 mcg by mouth daily before breakfast.   . Menthol, Topical Analgesic, (BENGAY EX) Apply 1 application topically 2 (two) times daily as needed (pain).  . Multiple Vitamins-Minerals (MULTIVITAMIN ADULT PO) Take by mouth.  . sacubitril-valsartan (ENTRESTO) 24-26 MG Take 1 tablet by mouth 2 (two) times daily.  Alveda Reasons 20 MG TABS tablet Take 20 mg by mouth daily.  . [DISCONTINUED] Iron-Vitamins (GERITOL COMPLETE) TABS Take 1 tablet by mouth daily with supper.   . [DISCONTINUED] Probiotic Product (PROBIOTIC DAILY) CAPS Take 1 capsule by mouth daily with supper.      Allergies:   Crestor [rosuvastatin calcium], Other, Benadryl [diphenhydramine hcl], Canagliflozin, Liraglutide, Metformin, Pravastatin, and Tramadol   Social History   Tobacco Use  . Smoking status: Former Smoker    Packs/day: 0.10    Years: 2.00    Pack years: 0.20    Types: Cigarettes  Quit date: 10/02/1979    Years since quitting: 40.2  . Smokeless tobacco: Never Used  Substance Use Topics  . Alcohol use: No    Alcohol/week: 0.0 standard drinks  . Drug use: No     Family Hx: The patient's family history includes Alzheimer's disease in her maternal aunt and mother; Atrial fibrillation in her mother; Breast cancer in her maternal aunt; Cancer in her maternal grandfather; Diabetes in her maternal grandmother; Heart disease in her father, maternal grandmother, mother, paternal grandfather, and paternal grandmother; Hypertension in her mother; Mitral valve prolapse in her sister.  ROS:   Please see the history of present illness.    All other systems reviewed and are negative.   Prior CV studies:   The following studies were reviewed today: Echo marc 2019  Labs/Other Tests and Data Reviewed:    EKG:  An ECG dated 12/11/2018 was personally reviewed today and demonstrated:  A sensed-V paced  Recent Labs: 07/23/2019: BUN 14; Creatinine, Ser 0.72; Hemoglobin  15.3; Platelets 250; Potassium 4.5; Sodium 141   Recent Lipid Panel No results found for: CHOL, TRIG, HDL, CHOLHDL, LDLCALC, LDLDIRECT  Wt Readings from Last 3 Encounters:  12/16/19 (!) 335 lb (152 kg)  07/23/19 (!) 332 lb (150.6 kg)  06/10/19 (!) 335 lb (152 kg)     Objective:    Vital Signs:  BP 131/79   Pulse 68   Ht 5\' 9"  (1.753 m)   Wt (!) 335 lb (152 kg)   LMP 08/07/2016   BMI 49.47 kg/m    VITAL SIGNS:  reviewed  ASSESSMENT & PLAN:     Non-ischemic cardiomyopathy ([place]) EF 30-35% March 2018- Aug 2018- EF improved to 55% by echo March 2019 after Biv ICD in Sept 2018  Normal coronary arteries Normal coronaries at cath March 2018  OSA-on C-pap Reports intermittent compliance with C-pap  Obesity BMI 49- Pt was originally evaluated for pre op clearance for bariatric surgery which the pt says is now off the table  ICD (implantable cardioverter-defibrillator) in place BS BiV ICD implanted Sept 2018  Factor V Leiden- H/O recurrent DVT, PE- Xarelto started March 2020  Essential hypertension controlled  Plan: Continue current medications- if her cough worsens consider trying Losartan and stopping Entresto but I encouraged her to continue Entresto if possible. F/U in office with Dr Gwenlyn Found in 6 months.   COVID-19 Education: The signs and symptoms of COVID-19 were discussed with the patient and how to seek care for testing (follow up with PCP or arrange E-visit).  The importance of social distancing was discussed today.  Time:   Today, I have spent 15 minutes with the patient with telehealth technology discussing the above problems.     Medication Adjustments/Labs and Tests Ordered: Current medicines are reviewed at length with the patient today.  Concerns regarding medicines are outlined above.   Tests Ordered: No orders of the defined types were placed in this encounter.   Medication Changes: No orders of the defined types were placed in this  encounter.   Follow Up:  In Person Dr Gwenlyn Found in 6 months  Signed, Kerin Ransom, Hershal Coria  12/16/2019 10:12 AM    [place]

## 2019-12-24 ENCOUNTER — Encounter: Payer: 59 | Admitting: Obstetrics and Gynecology

## 2020-01-05 ENCOUNTER — Ambulatory Visit (INDEPENDENT_AMBULATORY_CARE_PROVIDER_SITE_OTHER): Payer: 59 | Admitting: *Deleted

## 2020-01-05 DIAGNOSIS — I428 Other cardiomyopathies: Secondary | ICD-10-CM | POA: Diagnosis not present

## 2020-01-05 LAB — CUP PACEART REMOTE DEVICE CHECK
Battery Remaining Longevity: 126 mo
Battery Remaining Percentage: 100 %
Brady Statistic RA Percent Paced: 0 %
Brady Statistic RV Percent Paced: 2 %
Date Time Interrogation Session: 20210323005100
HighPow Impedance: 71 Ohm
Implantable Lead Implant Date: 20180912
Implantable Lead Implant Date: 20180912
Implantable Lead Implant Date: 20180912
Implantable Lead Location: 753858
Implantable Lead Location: 753859
Implantable Lead Location: 753860
Implantable Lead Model: 293
Implantable Lead Model: 4674
Implantable Lead Model: 7741
Implantable Lead Serial Number: 435131
Implantable Lead Serial Number: 813037
Implantable Lead Serial Number: 920623
Implantable Pulse Generator Implant Date: 20180912
Lead Channel Impedance Value: 547 Ohm
Lead Channel Impedance Value: 557 Ohm
Lead Channel Impedance Value: 727 Ohm
Lead Channel Setting Pacing Amplitude: 2 V
Lead Channel Setting Pacing Amplitude: 2.5 V
Lead Channel Setting Pacing Amplitude: 2.5 V
Lead Channel Setting Pacing Pulse Width: 0.4 ms
Lead Channel Setting Pacing Pulse Width: 0.4 ms
Lead Channel Setting Sensing Sensitivity: 0.5 mV
Lead Channel Setting Sensing Sensitivity: 1 mV
Pulse Gen Serial Number: 193993

## 2020-01-06 NOTE — Progress Notes (Signed)
ICD Remote  

## 2020-01-18 ENCOUNTER — Ambulatory Visit (INDEPENDENT_AMBULATORY_CARE_PROVIDER_SITE_OTHER): Payer: 59

## 2020-01-18 DIAGNOSIS — I5022 Chronic systolic (congestive) heart failure: Secondary | ICD-10-CM | POA: Diagnosis not present

## 2020-01-18 DIAGNOSIS — Z95810 Presence of automatic (implantable) cardiac defibrillator: Secondary | ICD-10-CM

## 2020-01-20 NOTE — Progress Notes (Signed)
EPIC Encounter for ICM Monitoring  Patient Name: Monica Solis is a 59 y.o. female Date: 01/20/2020 Primary Care Physican: Reynold Bowen, MD Primary Cardiologist:Berry Electrophysiologist:Taylor 09/23/2019 Weight: 329.4lbs  Spoke with patient and reports feeling well at this time.  Denies fluid symptoms.    01/17/2020 HeartLogicHFIndex is3which is within normal threshold range.  Prescribed:Hydrochlorothiazide12.5mg Take 12.5 mg by mouth daily as needed (fluid).  Labs: 07/23/2019 Creatinine 0.72, BUN 14, Potassium 4.5, Sodium 141, GFR 93-108  Recommendations:No changes and encouraged to call if experiencing any fluid symptoms.  Follow-up plan: ICM clinic phone appointment on5/08/2020. 91 day device clinic remote transmission 04/05/2020.   Copy of ICM check sent to Dr. Lovena Le.  3 Month Trend          [name], RN 01/20/2020 8:59 AM

## 2020-02-23 ENCOUNTER — Ambulatory Visit (INDEPENDENT_AMBULATORY_CARE_PROVIDER_SITE_OTHER): Payer: 59

## 2020-02-23 DIAGNOSIS — I5022 Chronic systolic (congestive) heart failure: Secondary | ICD-10-CM

## 2020-02-23 DIAGNOSIS — Z95810 Presence of automatic (implantable) cardiac defibrillator: Secondary | ICD-10-CM

## 2020-02-26 NOTE — Progress Notes (Signed)
EPIC Encounter for ICM Monitoring  Patient Name: Monica Solis is a 59 y.o. female Date: 02/26/2020 Primary Care Physican: Reynold Bowen, MD Primary Cardiologist:Berry Electrophysiologist:Taylor 12/16/2018 Office Weight: 335lbs  Spoke with patient and reports feeling well at this time.  Denies fluid symptoms.  She had side effects from COVID Vaccine.  5/10/2021HeartLogicHFIndex is4which is within normal threshold range.  Prescribed:Hydrochlorothiazide12.5mg Take 12.5 mg by mouth daily as needed (fluid).  Labs: 07/23/2019 Creatinine 0.72, BUN 14, Potassium 4.5, Sodium 141, GFR 93-108  Recommendations:No changes and encouraged to call if experiencing any fluid symptoms.  Follow-up plan: ICM clinic phone appointment on6/14/2021. 91 day device clinic remote transmission 04/05/2020.   Copy of ICM check sent to Dr. Lovena Le.  3 Month Trend    8 Day Data Trend         [name], RN 02/26/2020 4:27 PM

## 2020-03-28 ENCOUNTER — Ambulatory Visit (INDEPENDENT_AMBULATORY_CARE_PROVIDER_SITE_OTHER): Payer: 59

## 2020-03-28 DIAGNOSIS — I5022 Chronic systolic (congestive) heart failure: Secondary | ICD-10-CM | POA: Diagnosis not present

## 2020-03-28 DIAGNOSIS — Z95810 Presence of automatic (implantable) cardiac defibrillator: Secondary | ICD-10-CM

## 2020-03-29 ENCOUNTER — Telehealth: Payer: Self-pay

## 2020-03-29 NOTE — Telephone Encounter (Signed)
Remote ICM transmission received.  Attempted call to patient regarding ICM remote transmission and left detailed message per DPR.  Advised to return call for any fluid symptoms or questions. Next ICM remote transmission scheduled 05/02/2020.     

## 2020-03-29 NOTE — Progress Notes (Signed)
EPIC Encounter for ICM Monitoring  Patient Name: Jakiya Bookbinder is a 59 y.o. female Date: 03/29/2020 Primary Care Physican: Reynold Bowen, MD Primary Cardiologist:Berry Electrophysiologist:Taylor 12/16/2018 Office Weight: 335lbs  Attempted call to patient and unable to reach.  Left detailed message per DPR regarding transmission. Transmission reviewed.   6/13/2021HeartLogicHFIndex is4which is within normal threshold range.  Prescribed:Hydrochlorothiazide12.5mg Take 12.5 mg by mouth daily as needed (fluid).  Labs: 07/23/2019 Creatinine 0.72, BUN 14, Potassium 4.5, Sodium 141, GFR 93-108  Recommendations:Left voice mail with ICM number and encouraged to call if experiencing any fluid symptoms.  Follow-up plan: ICM clinic phone appointment on7/19/2021. 91 day device clinic remote transmission6/22/2021.   Copy of ICM check sent to Dr. Lovena Le.  3 Month Trend    8 Day Data Trend          [name], RN 03/29/2020 3:47 PM

## 2020-04-05 ENCOUNTER — Ambulatory Visit (INDEPENDENT_AMBULATORY_CARE_PROVIDER_SITE_OTHER): Payer: 59 | Admitting: *Deleted

## 2020-04-05 ENCOUNTER — Other Ambulatory Visit: Payer: Self-pay

## 2020-04-05 DIAGNOSIS — I428 Other cardiomyopathies: Secondary | ICD-10-CM | POA: Diagnosis not present

## 2020-04-05 DIAGNOSIS — I5022 Chronic systolic (congestive) heart failure: Secondary | ICD-10-CM

## 2020-04-05 LAB — CUP PACEART REMOTE DEVICE CHECK
Battery Remaining Longevity: 120 mo
Battery Remaining Percentage: 100 %
Brady Statistic RA Percent Paced: 0 %
Brady Statistic RV Percent Paced: 2 %
Date Time Interrogation Session: 20210622005600
HighPow Impedance: 69 Ohm
Implantable Lead Implant Date: 20180912
Implantable Lead Implant Date: 20180912
Implantable Lead Implant Date: 20180912
Implantable Lead Location: 753858
Implantable Lead Location: 753859
Implantable Lead Location: 753860
Implantable Lead Model: 293
Implantable Lead Model: 4674
Implantable Lead Model: 7741
Implantable Lead Serial Number: 435131
Implantable Lead Serial Number: 813037
Implantable Lead Serial Number: 920623
Implantable Pulse Generator Implant Date: 20180912
Lead Channel Impedance Value: 516 Ohm
Lead Channel Impedance Value: 560 Ohm
Lead Channel Impedance Value: 713 Ohm
Lead Channel Setting Pacing Amplitude: 2 V
Lead Channel Setting Pacing Amplitude: 2.5 V
Lead Channel Setting Pacing Amplitude: 2.5 V
Lead Channel Setting Pacing Pulse Width: 0.4 ms
Lead Channel Setting Pacing Pulse Width: 0.4 ms
Lead Channel Setting Sensing Sensitivity: 0.5 mV
Lead Channel Setting Sensing Sensitivity: 1 mV
Pulse Gen Serial Number: 193993

## 2020-04-06 ENCOUNTER — Encounter: Payer: Self-pay | Admitting: Obstetrics and Gynecology

## 2020-04-06 ENCOUNTER — Ambulatory Visit (INDEPENDENT_AMBULATORY_CARE_PROVIDER_SITE_OTHER): Payer: 59 | Admitting: Obstetrics and Gynecology

## 2020-04-06 VITALS — BP 134/84 | Ht 68.0 in | Wt 334.0 lb

## 2020-04-06 DIAGNOSIS — Z01419 Encounter for gynecological examination (general) (routine) without abnormal findings: Secondary | ICD-10-CM | POA: Diagnosis not present

## 2020-04-06 NOTE — Progress Notes (Signed)
Monica [facility] 1961-01-03 [id number]  SUBJECTIVE:  59 y.o. G1P0010 female for annual routine gynecologic exam. She has no gynecologic concerns.  Current Outpatient Medications  Medication Sig Dispense Refill  . carvedilol (COREG) 3.125 MG tablet Take 1 tablet (3.125 mg total) by mouth 2 (two) times daily. 180 tablet 3  . Cholecalciferol (VITAMIN D3) 2000 UNITS capsule Take 4,000 Units by mouth daily with supper.     . diclofenac sodium (VOLTAREN) 1 % GEL Apply 2 g topically 4 (four) times daily.     [name] esomeprazole (NEXIUM) 40 MG capsule Take by mouth daily.  5  . fexofenadine (ALLEGRA) 180 MG tablet Take 180 mg by mouth daily as needed for allergies or rhinitis.    [name] gabapentin (NEURONTIN) 100 MG capsule Take 300 mg by mouth at bedtime. TAKE ONE TABLET IN THE MORNING AND TWO TABLETS AT NIGHT.    [name] hydrochlorothiazide (HYDRODIURIL) 12.5 MG tablet TAKE 1 TABLET BY MOUTH EVERY DAY AS NEEDED 90 tablet 3  . insulin aspart (NOVOLOG) 100 UNIT/ML injection Inject 7-10 Units into the skin 3 (three) times daily as needed for high blood sugar. Per SSC 7-10units when cbg above 120    . insulin detemir (LEVEMIR) 100 UNIT/ML injection Inject 40 Units into the skin at bedtime.     [name] levothyroxine (SYNTHROID, LEVOTHROID) 75 MCG tablet Take 75 mcg by mouth daily before breakfast.     . Menthol, Topical Analgesic, (BENGAY EX) Apply 1 application topically 2 (two) times daily as needed (pain).    . sacubitril-valsartan (ENTRESTO) 24-26 MG Take 1 tablet by mouth 2 (two) times daily. 180 tablet 3  . XARELTO 20 MG TABS tablet Take 20 mg by mouth daily.     No current facility-administered medications for this visit.   Facility-Administered Medications Ordered in Other Visits  Medication Dose Route Frequency Provider Last Rate Last Admin  . technetium tetrofosmin (TC-MYOVIEW) injection 14.4 millicurie  31.5 millicurie Intravenous Once PRN Hilty, Nadean Corwin, MD       Allergies: Crestor [rosuvastatin calcium],  Other, Benadryl [diphenhydramine hcl], Canagliflozin, Liraglutide, Metformin, Pravastatin, and Tramadol  Patient's last menstrual period was 08/07/2016.  Past medical history,surgical history, problem list, medications, allergies, family history and social history were all reviewed and documented as reviewed in the EPIC chart.  ROS:  Feeling well. No dyspnea or chest pain on exertion.  No abdominal pain, change in bowel habits, black or bloody stools.  No urinary tract symptoms. GYN ROS: normal menses, no abnormal bleeding, pelvic pain or discharge, no breast pain or new or enlarging lumps on self exam. No neurological complaints.    OBJECTIVE:  BP 134/84 (Cuff Size: Large)   Ht 5\' 8"  (1.727 m)   Wt (!) 334 lb (151.5 kg)   LMP 08/07/2016   BMI 50.78 kg/m  The patient appears well, alert, oriented x 3, in no distress. ENT normal.  Neck supple. No cervical or supraclavicular adenopathy or thyromegaly.  Lungs are clear, good air entry, no wheezes, rhonchi or rales. S1 and S2 normal, no murmurs, regular rate and rhythm.  Abdomen soft without tenderness, guarding, mass or organomegaly.  Neurological is normal, no focal findings.  BREAST EXAM: breasts appear normal, isolated sclerotic lesions few centimeters in diameter located near the mammary fold bilaterally, no suspicious masses, no skin or nipple changes or axillary nodes  PELVIC EXAM: VULVA: Grossly normal appearing vulva with no masses, tenderness, there is a speckled sclerotic lesions mottling the skin associated around the hair follicles of  the bilateral labia minora, no other lesions, VAGINA: normal appearing vagina with normal color and discharge, no lesions, CERVIX: normal appearing cervix without discharge or lesions, UTERUS & ADNEXA: Limited exam due to body habitus, no tenderness  Chaperone: [name] present during the examination  ASSESSMENT:  59 y.o. G1P0010 here for annual gynecologic exam  PLAN:   1. Postmenopausal.   No recurrent vaginal bleeding, she does say she occasionally has external pimple or sore on the bottom that bleeds with wiping but none was found on exam today and she has not been having a problem lately.  The only notable finding was the sclerotic skin patches over the bilateral labia minora which do not seem to be bothering her.  She is certain she is not having any internal vaginal bleeding.  Had negative sonohysterogram with endometrial biopsy 2019 for episode of postmenopausal bleeding.    She is encouraged to let us know if she starts to develop any spontaneous bleeding that occurs without wiping or any other concerns regarding bleeding recurrently.  She is on Xarelto given her recent DVT. 2. Pap smear/HPV 12/2018.  No history of abnormal Pap smears.  Next Pap smear due 2025 following the current guidelines recommending the 5 year cotesting interval. 3. Mammogram 09/2018.  Normal breast exam today.  Number gram overdue and she says she will get this done in [facility] soon.  She says she has had the sclerotic changes of the breast skin tested before and there are no concerning findings. 4. Colonoscopy 2013.  Recommended that she follow up at the recommended interval.  5. DEXA never.  Will plan as she approaches age 36. 63. Health maintenance.  No labs today as she normally has these completed with her primary care doctor.   Return annually or sooner, prn.  Joseph Pierini MD 04/06/20

## 2020-04-06 NOTE — Progress Notes (Signed)
Remote ICD transmission.   

## 2020-04-06 NOTE — Patient Instructions (Signed)
Please remember to schedule a routine annual mammogram. 

## 2020-04-11 ENCOUNTER — Encounter: Payer: 59 | Admitting: Obstetrics and Gynecology

## 2020-04-19 ENCOUNTER — Encounter: Payer: Self-pay | Admitting: Obstetrics and Gynecology

## 2020-05-17 ENCOUNTER — Ambulatory Visit (INDEPENDENT_AMBULATORY_CARE_PROVIDER_SITE_OTHER): Payer: 59

## 2020-05-17 DIAGNOSIS — Z95810 Presence of automatic (implantable) cardiac defibrillator: Secondary | ICD-10-CM | POA: Diagnosis not present

## 2020-05-17 DIAGNOSIS — I5022 Chronic systolic (congestive) heart failure: Secondary | ICD-10-CM

## 2020-05-17 NOTE — Progress Notes (Signed)
EPIC Encounter for ICM Monitoring  Patient Name: Monica Solis is a 59 y.o. female Date: 05/17/2020 Primary Care Physican: Reynold Bowen, MD Primary Cardiologist:Berry Electrophysiologist:Taylor 8/3/2020Weight: [phone]  Spoke with patient. She is doing well and denies any fluid symptoms.   8/2/2021HeartLogicHFIndex is2which is within normal threshold range.  Prescribed:Hydrochlorothiazide12.5mg Take 12.5 mg by mouth daily as needed (fluid).  Labs: 07/23/2019 Creatinine 0.72, BUN 14, Potassium 4.5, Sodium 141, GFR 93-108  Follow-up plan: ICM clinic phone appointment on 06/21/2020.   91 day device clinic remote transmission 07/05/2020.             EP/Cardiology next office visit: 06/14/2020 with Dr. Gwenlyn Found         Copy of ICM check sent to Dr. Lovena Le.  3 Month Trend    8 Day Data Trend          [name], RN 05/17/2020 1:40 PM

## 2020-06-07 ENCOUNTER — Telehealth: Payer: Self-pay | Admitting: Adult Health

## 2020-06-14 ENCOUNTER — Ambulatory Visit: Payer: 59 | Admitting: Cardiovascular Disease

## 2020-06-14 ENCOUNTER — Telehealth (INDEPENDENT_AMBULATORY_CARE_PROVIDER_SITE_OTHER): Payer: 59 | Admitting: Cardiology

## 2020-06-14 VITALS — BP 127/83 | HR 76 | Ht 69.0 in | Wt 330.0 lb

## 2020-06-14 DIAGNOSIS — Z95810 Presence of automatic (implantable) cardiac defibrillator: Secondary | ICD-10-CM

## 2020-06-14 DIAGNOSIS — G4730 Sleep apnea, unspecified: Secondary | ICD-10-CM | POA: Diagnosis not present

## 2020-06-14 DIAGNOSIS — I10 Essential (primary) hypertension: Secondary | ICD-10-CM

## 2020-06-14 DIAGNOSIS — E11628 Type 2 diabetes mellitus with other skin complications: Secondary | ICD-10-CM

## 2020-06-14 DIAGNOSIS — I428 Other cardiomyopathies: Secondary | ICD-10-CM

## 2020-06-14 DIAGNOSIS — Z794 Long term (current) use of insulin: Secondary | ICD-10-CM

## 2020-06-14 MED ORDER — SACUBITRIL-VALSARTAN 24-26 MG PO TABS: 1.0000 | ORAL_TABLET | Freq: Two times a day (BID) | ORAL | 3 refills | Status: DC

## 2020-06-14 MED ORDER — CARVEDILOL 3.125 MG PO TABS: 3.1250 mg | ORAL_TABLET | Freq: Two times a day (BID) | ORAL | 3 refills | Status: DC

## 2020-06-14 NOTE — Progress Notes (Signed)
Virtual Visit via Telephone Note   This visit type was conducted due to national recommendations for restrictions regarding the COVID-19 Pandemic (e.g. social distancing) in an effort to limit this patient's exposure and mitigate transmission in our community.  Due to her co-morbid illnesses, this patient is at least at moderate risk for complications without adequate follow up.  This format is felt to be most appropriate for this patient at this time.  The patient did not have access to video technology/had technical difficulties with video requiring transitioning to audio format only (telephone).  All issues noted in this document were discussed and addressed.  No physical exam could be performed with this format.  Please refer to the patient's chart for her  consent to telehealth for [facility].    Date:  06/14/2020   ID:  Monica Solis, DOB [birth year], MRN [id number] The patient was identified using 2 identifiers.  Patient Location: Home Provider Location: Home Office  PCP:  Reynold Bowen, MD  Cardiologist:  Quay Burow, MD  Electrophysiologist:  Cristopher Peru, MD   Evaluation Performed:  Follow-Up Visit  Chief Complaint:  none  History of Present Illness:    Monica Solis is a 60 y.o. female with a history of CAD who was contacted today for a routine follow up. She had a Myoview study in Jan 2018 which was abnormal showing an ejection fraction of 35%.  Diagnostic catheterization was done March 2018 which revealed normal coronaries.  Follow-up echocardiogram on medical Rx August 2018 showed an ejection fraction of 30 to 35%.  In September 2018 she underwent BiV ICD implant by Dr. Lovena Le.  Follow-up echocardiogram March 2019 showed her EF to be improved to 60%.  She never had her bariatric surgery.  She has done well from a cardiac standpoint since.  Up titration of her medications has been limited by hypotension. She has some orthostatic symptoms, mainly from bending over. She has  sleep apnea and has not been able to tolerate C-pap and is no longer using this.  She tells me she is actually sleeping better off C-pap.  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  This was started in March 2020 after she presented with a DVT.  Other medical issues include insulin-dependent diabetes with neuropathy.  The patient does not have symptoms concerning for COVID-19 infection (fever, chills, cough, or new shortness of breath).  She has been vaccinated against COVID   Past Medical History:  Diagnosis Date  . Abnormal uterine bleeding (AUB)   . AICD (automatic cardioverter/defibrillator) present   . At risk for sleep apnea    STOP-BANG=  5       SENT TO PCP 10-01-2016  . Blood clot in vein    "superficial blood clots in BLE; I had 6-7 since 2000" (06/26/2017)  . BMI 45.0-49.9, adult ([place])   . Chronic bronchitis ([place])   . Diverticulosis of colon   . Endometrial polyp   . Factor 5 Leiden mutation, heterozygous ([place])    dx 2011  . GERD (gastroesophageal reflux disease)   . High cholesterol   . History of DVT of lower extremity 2000; 2011   "I've had them in both"  . History of ectopic pregnancy 1998  . Hypertension   . Hypothyroidism   . Left bundle branch block (LBBB)   . Osteoarthritis    "all over" (06/26/2017)  . Peripheral neuropathy   . PONV (postoperative nausea and vomiting)   . Type 2 diabetes mellitus  treated with insulin [facility])    Past Surgical History:  Procedure Laterality Date  . BI-VENTRICULAR IMPLANTABLE CARDIOVERTER DEFIBRILLATOR  (CRT-D)  06/26/2017  . BIV ICD INSERTION CRT-D N/A 06/26/2017   Procedure: BIV ICD INSERTION CRT-D;  Surgeon: Evans Lance, MD;  Location: [place] [facility];  Service: Cardiovascular;  Laterality: N/A;  . COLONOSCOPY  2013  . DILATATION & CURETTAGE/HYSTEROSCOPY WITH MYOSURE N/A 10/05/2016   Procedure: [place];  Surgeon: Anastasio Auerbach, MD;  Location: [place];  Service: Gynecology;  Laterality: N/A;  requests to follow 7:30am caserequests one hour OR time  . DILATION AND CURETTAGE OF UTERUS  12/06/2007  . LAPAROSCOPY BILATERAL TUBAL LIGATION WITH FALOPE RINGS/  D&C HYSTEROSCOPY WITH POLYPECTOMY  12/06/2007   and Lysis Adhesions  . LEFT HEART CATH AND CORONARY ANGIOGRAPHY N/A 01/03/2017   Procedure: Left Heart Cath and Coronary Angiography;  Surgeon: Lorretta Harp, MD;  Location: [place] [facility];  Service: Cardiovascular;  Laterality: N/A;  . TUBAL LIGATION  12/06/2007     Current Meds  Medication Sig  . carvedilol (COREG) 3.125 MG tablet Take 1 tablet (3.125 mg total) by mouth 2 (two) times daily.  . Cholecalciferol (VITAMIN D3) 2000 UNITS capsule Take 4,000 Units by mouth daily with supper.   . diclofenac sodium (VOLTAREN) 1 % GEL Apply 2 g topically 4 (four) times daily.   [name] esomeprazole (NEXIUM) 40 MG capsule Take by mouth daily.  . fexofenadine (ALLEGRA) 180 MG tablet Take 180 mg by mouth daily as needed for allergies or rhinitis.  [name] gabapentin (NEURONTIN) 100 MG capsule Take 300 mg by mouth at bedtime. TAKE ONE TABLET IN THE MORNING AND TWO TABLETS AT NIGHT.  [name] hydrochlorothiazide (HYDRODIURIL) 12.5 MG tablet TAKE 1 TABLET BY MOUTH EVERY DAY AS NEEDED  . insulin aspart (NOVOLOG) 100 UNIT/ML injection Inject 7-10 Units into the skin 3 (three) times daily as needed for high blood sugar. Per SSC 7-10units when cbg above 120  . insulin detemir (LEVEMIR) 100 UNIT/ML injection Inject 40 Units into the skin at bedtime.   [name] levothyroxine (SYNTHROID, LEVOTHROID) 75 MCG tablet Take 75 mcg by mouth daily before breakfast.   . Menthol, Topical Analgesic, (BENGAY EX) Apply 1 application topically 2 (two) times daily as needed (pain).  . sacubitril-valsartan (ENTRESTO) 24-26 MG Take 1 tablet by mouth 2 (two) times daily.  Alveda Reasons 20 MG TABS tablet Take 20 mg by mouth daily.     Allergies:   Crestor [rosuvastatin calcium], Other,  Benadryl [diphenhydramine hcl], Canagliflozin, Liraglutide, Metformin, Pravastatin, and Tramadol   Social History   Tobacco Use  . Smoking status: Former Smoker    Packs/day: 0.10    Years: 2.00    Pack years: 0.20    Types: Cigarettes    Quit date: 10/02/1979    Years since quitting: 40.7  . Smokeless tobacco: Never Used  Vaping Use  . Vaping Use: Never used  Substance Use Topics  . Alcohol use: No    Alcohol/week: 0.0 standard drinks  . Drug use: No     Family Hx: The patient's family history includes Alzheimer's disease in her maternal aunt and mother; Atrial fibrillation in her mother; Breast cancer in her maternal aunt; Cancer in her maternal grandfather; Diabetes in her maternal grandmother; Heart disease in her father, maternal grandmother, mother, paternal grandfather, and paternal grandmother; Hypertension in her mother; Mitral valve prolapse in her sister.  ROS:   Please see the  history of present illness.    All other systems reviewed and are negative.   Prior CV studies:   The following studies were reviewed today:  Echo March 2019- Impressions:   - Normal LV systolic function; mild diastolic dysfunction; mild  LVH; mild LVE; mildly dilated ascending aorta; mildly dilated RV.  Labs/Other Tests and Data Reviewed:    EKG:  An ECG dated 12/11/2018 was personally reviewed today and demonstrated:  paced  Recent Labs: 07/23/2019: BUN 14; Creatinine, Ser 0.72; Hemoglobin 15.3; Platelets 250; Potassium 4.5; Sodium 141   Recent Lipid Panel No results found for: CHOL, TRIG, HDL, CHOLHDL, LDLCALC, LDLDIRECT  Wt Readings from Last 3 Encounters:  06/14/20 (!) 330 lb (149.7 kg)  04/06/20 (!) 334 lb (151.5 kg)  12/16/19 (!) 335 lb (152 kg)     Objective:    Vital Signs:  BP 127/83   Pulse 76   Ht 5\' 9"  (1.753 m)   Wt (!) 330 lb (149.7 kg)   LMP 08/07/2016   BMI 48.73 kg/m    VITAL SIGNS:  reviewed  ASSESSMENT & PLAN:    Non-ischemic cardiomyopathy  ([place]) EF 30-35% March 2018- Aug 2018- EF improved to 55% by echo March 2019 after Biv ICD in Sept 2018  Normal coronary arteries Normal coronaries at cath March 2018  OSA- Unable to tolerate C-pap  Obesity BMI 49- Pt was originally evaluated for pre op clearance for bariatric surgery which the pt says is now off the table  ICD (implantable cardioverter-defibrillator) in place BS BiV ICD implanted Sept 2018  Essential hypertension Controlled  COVID-19 Education: The signs and symptoms of COVID-19 were discussed with the patient and how to seek care for testing (follow up with PCP or arrange E-visit).  The importance of social distancing was discussed today.  Time:   Today, I have spent 15 minutes with the patient with telehealth technology discussing the above problems.     Medication Adjustments/Labs and Tests Ordered: Current medicines are reviewed at length with the patient today.  Concerns regarding medicines are outlined above.   Tests Ordered: No orders of the defined types were placed in this encounter.   Medication Changes: No orders of the defined types were placed in this encounter.   Follow Up:  In Person with Dr Gwenlyn Found in 6 months  Signed, Kerin Ransom, Hershal Coria  06/14/2020 8:57 AM    [place]

## 2020-06-14 NOTE — Patient Instructions (Signed)
Medication Instructions:  Your Physician recommend you continue on your current medication as directed.    *If you need a refill on your cardiac medications before your next appointment, please call your pharmacy*   Lab Work: None   Testing/Procedures: None   Follow-Up: At [facility], you and your health needs are our priority.  As part of our continuing mission to provide you with exceptional heart care, we have created designated Provider Care Teams.  These Care Teams include your primary Cardiologist (physician) and Advanced Practice Providers (APPs -  Physician Assistants and Nurse Practitioners) who all work together to provide you with the care you need, when you need it.  We recommend signing up for the patient portal called "MyChart".  Sign up information is provided on this After Visit Summary.  MyChart is used to connect with patients for Virtual Visits (Telemedicine).  Patients are able to view lab/test results, encounter notes, upcoming appointments, etc.  Non-urgent messages can be sent to your provider as well.   To learn more about what you can do with MyChart, go to [email or web address].    Your next appointment:   6 month(s)  The format for your next appointment:   In Person  Provider:   Quay Burow, MD

## 2020-06-21 ENCOUNTER — Ambulatory Visit (INDEPENDENT_AMBULATORY_CARE_PROVIDER_SITE_OTHER): Payer: 59

## 2020-06-21 DIAGNOSIS — I5022 Chronic systolic (congestive) heart failure: Secondary | ICD-10-CM | POA: Diagnosis not present

## 2020-06-21 DIAGNOSIS — Z95810 Presence of automatic (implantable) cardiac defibrillator: Secondary | ICD-10-CM

## 2020-06-24 NOTE — Progress Notes (Signed)
EPIC Encounter for ICM Monitoring  Patient Name: Monica Solis is a 59 y.o. female Date: 06/24/2020 Primary Care Physican: Reynold Bowen, MD Primary Cardiologist:Berry Electrophysiologist:Taylor 8/3/2020Weight: [phone]  Spoke with patient and reports feeling well at this time.  Denies fluid symptoms.    9/6/2021HeartLogicHFIndex is6suggesting normal fluid levels and within normal threshold range.  Prescribed:Hydrochlorothiazide12.5mg Take 12.5 mg by mouth daily as needed (fluid).  Labs: 07/23/2019 Creatinine 0.72, BUN 14, Potassium 4.5, Sodium 141, GFR 93-108  Recommendations: No changes and encouraged to call if experiencing any fluid symptoms.  Follow-up plan: ICM clinic phone appointment on 07/25/2020.   91 day device clinic remote transmission 07/05/2020.             EP/Cardiology next office visit: 08/01/2020 with Tommye Standard, PA3/10/2020 with Dr. Gwenlyn Found         Copy of ICM check sent to Dr. Lovena Le.  3 Month Trend    8 Day Data Trend          [name], RN 06/24/2020 9:16 AM

## 2020-07-05 ENCOUNTER — Ambulatory Visit (INDEPENDENT_AMBULATORY_CARE_PROVIDER_SITE_OTHER): Payer: 59 | Admitting: *Deleted

## 2020-07-05 DIAGNOSIS — I428 Other cardiomyopathies: Secondary | ICD-10-CM

## 2020-07-05 LAB — CUP PACEART REMOTE DEVICE CHECK
Battery Remaining Longevity: 120 mo
Battery Remaining Percentage: 100 %
Brady Statistic RA Percent Paced: 0 %
Brady Statistic RV Percent Paced: 3 %
Date Time Interrogation Session: 20210921030100
HighPow Impedance: 71 Ohm
Implantable Lead Implant Date: 20180912
Implantable Lead Implant Date: 20180912
Implantable Lead Implant Date: 20180912
Implantable Lead Location: 753858
Implantable Lead Location: 753859
Implantable Lead Location: 753860
Implantable Lead Model: 293
Implantable Lead Model: 4674
Implantable Lead Model: 7741
Implantable Lead Serial Number: 435131
Implantable Lead Serial Number: 813037
Implantable Lead Serial Number: 920623
Implantable Pulse Generator Implant Date: 20180912
Lead Channel Impedance Value: 526 Ohm
Lead Channel Impedance Value: 593 Ohm
Lead Channel Impedance Value: 721 Ohm
Lead Channel Setting Pacing Amplitude: 2 V
Lead Channel Setting Pacing Amplitude: 2.5 V
Lead Channel Setting Pacing Amplitude: 2.5 V
Lead Channel Setting Pacing Pulse Width: 0.4 ms
Lead Channel Setting Pacing Pulse Width: 0.4 ms
Lead Channel Setting Sensing Sensitivity: 0.5 mV
Lead Channel Setting Sensing Sensitivity: 1 mV
Pulse Gen Serial Number: 193993

## 2020-07-08 NOTE — Progress Notes (Signed)
Remote ICD transmission.   

## 2020-07-25 ENCOUNTER — Ambulatory Visit (INDEPENDENT_AMBULATORY_CARE_PROVIDER_SITE_OTHER): Payer: 59

## 2020-07-25 DIAGNOSIS — Z95810 Presence of automatic (implantable) cardiac defibrillator: Secondary | ICD-10-CM

## 2020-07-25 DIAGNOSIS — I5022 Chronic systolic (congestive) heart failure: Secondary | ICD-10-CM | POA: Diagnosis not present

## 2020-07-26 NOTE — Progress Notes (Signed)
EPIC Encounter for ICM Monitoring  Patient Name: Monica Solis is a 59 y.o. female Date: 07/26/2020 Primary Care Physican: Reynold Bowen, MD Primary Cardiologist:Berry Electrophysiologist:Taylor 8/3/2020Weight: [phone]  Transmission reviewed.  10/11/2021HeartLogicHFIndex is2suggesting normal fluid levels and within normal threshold range.  Prescribed:Hydrochlorothiazide12.5mg Take 12.5 mg by mouth daily as needed (fluid).  Labs: 07/23/2019 Creatinine 0.72, BUN 14, Potassium 4.5, Sodium 141, GFR 93-108  Recommendations: No changes.  Follow-up plan: ICM clinic phone appointment on11/16/2021. 91 day device clinic remote transmission 10/05/2020.   EP/Cardiology next office visit: 08/01/2020 with Monica Solis.  Recall 12/13/2020 with Dr.Berry  Copy of ICM check sent to Dr. Lovena Le.  3 Month Trend    8 Day Data Trend          [name], RN 07/26/2020 4:36 PM

## 2020-07-29 NOTE — Progress Notes (Signed)
Cardiology Office Note Date:  07/29/2020  Patient ID:  Monica Solis, Monica Solis 12/09/1960, MRN [id number] PCP:  Reynold Bowen, MD  Cardiologist:  Dr. Gwenlyn Found Electrophysiologist: Dr. Lovena Le    Chief Complaint:   annual EP  visit  History of Present Illness: Monica Solis is a 59 y.o. female with history of Factor V Leiden deficiency, h/o DVT/PE (2011), NICM, chronic CHF (systolic), CRT-D, normalization of her LVEF, HTN, HLD, DM, obesity, OSA (intolerant of CPAP).  I saw her 07/23/2019 She is doing very well.  Denies any concerns, symptoms.  Requests the flu shot while she is here.   She denies any CP, palpitations or SOB.  No changes to her exertional acpacity.  No near syncope or syncope.  Rarely she feels a very fleeting/momentary dizziness, this dates back years and is if anything much improved in the last couple years.  No bleeding or signs of bleeding with her xarelto. She mentions Dr. Gwenlyn Found had wanted to start her on Zetia, though she declined, reporting significant intolerance to statins and though not the same family of drug, does not want it.Monica Solis No changes were made, planned for remotes and annual clinic visit  She saw her cardiology team, Oswaldo Done, PA Aug 2021, no reported symptoms, no changes were made.  TODAY She is doing OK, in the last year or so, notes whne sheis bent over she gets very lightheaded and has to sit back up to get it to resolve, she has not fainted.  Gives an example when bent over working in the garden or dusting low things. None otherwise. No syncope or shocks No CP, palpitations or cardiac awareness Denies SOB. No bleeding or signs of bleeding.  She has an annual visit with her PMD Nov 11, planned for "full labs".  Device information BSci CRT-D, implanted, 06/26/17   Past Medical History:  Diagnosis Date  . Abnormal uterine bleeding (AUB)   . AICD (automatic cardioverter/defibrillator) present   . At risk for sleep apnea    STOP-BANG=  5       SENT TO  PCP 10-01-2016  . Blood clot in vein    "superficial blood clots in BLE; I had 6-7 since 2000" (06/26/2017)  . BMI 45.0-49.9, adult ([place])   . Chronic bronchitis ([place])   . Diverticulosis of colon   . Endometrial polyp   . Factor 5 Leiden mutation, heterozygous ([place])    dx 2011  . GERD (gastroesophageal reflux disease)   . High cholesterol   . History of DVT of lower extremity 2000; 2011   "I've had them in both"  . History of ectopic pregnancy 1998  . Hypertension   . Hypothyroidism   . Left bundle branch block (LBBB)   . Osteoarthritis    "all over" (06/26/2017)  . Peripheral neuropathy   . PONV (postoperative nausea and vomiting)   . Type 2 diabetes mellitus treated with insulin [facility])     Past Surgical History:  Procedure Laterality Date  . BI-VENTRICULAR IMPLANTABLE CARDIOVERTER DEFIBRILLATOR  (CRT-D)  06/26/2017  . BIV ICD INSERTION CRT-D N/A 06/26/2017   Procedure: BIV ICD INSERTION CRT-D;  Surgeon: Evans Lance, MD;  Location: [place] [facility];  Service: Cardiovascular;  Laterality: N/A;  . COLONOSCOPY  2013  . DILATATION & CURETTAGE/HYSTEROSCOPY WITH MYOSURE N/A 10/05/2016   Procedure: [place];  Surgeon: Anastasio Auerbach, MD;  Location: [place];  Service: Gynecology;  Laterality: N/A;  requests to follow 7:30am caserequests one  hour OR time  . DILATION AND CURETTAGE OF UTERUS  12/06/2007  . LAPAROSCOPY BILATERAL TUBAL LIGATION WITH FALOPE RINGS/  D&C HYSTEROSCOPY WITH POLYPECTOMY  12/06/2007   and Lysis Adhesions  . LEFT HEART CATH AND CORONARY ANGIOGRAPHY N/A 01/03/2017   Procedure: Left Heart Cath and Coronary Angiography;  Surgeon: Lorretta Harp, MD;  Location: [place] [facility];  Service: Cardiovascular;  Laterality: N/A;  . TUBAL LIGATION  12/06/2007    Current Outpatient Medications  Medication Sig Dispense Refill  . carvedilol (COREG) 3.125 MG tablet Take 1 tablet (3.125 mg total) by mouth 2  (two) times daily. 180 tablet 3  . Cholecalciferol (VITAMIN D3) 2000 UNITS capsule Take 4,000 Units by mouth daily with supper.     . diclofenac sodium (VOLTAREN) 1 % GEL Apply 2 g topically 4 (four) times daily.     Monica Solis esomeprazole (NEXIUM) 40 MG capsule Take by mouth daily.  5  . fexofenadine (ALLEGRA) 180 MG tablet Take 180 mg by mouth daily as needed for allergies or rhinitis.    Monica Solis gabapentin (NEURONTIN) 100 MG capsule Take 300 mg by mouth at bedtime. TAKE ONE TABLET IN THE MORNING AND TWO TABLETS AT NIGHT.    Monica Solis hydrochlorothiazide (HYDRODIURIL) 12.5 MG tablet TAKE 1 TABLET BY MOUTH EVERY DAY AS NEEDED 90 tablet 3  . insulin aspart (NOVOLOG) 100 UNIT/ML injection Inject 7-10 Units into the skin 3 (three) times daily as needed for high blood sugar. Per SSC 7-10units when cbg above 120    . insulin detemir (LEVEMIR) 100 UNIT/ML injection Inject 40 Units into the skin at bedtime.     Monica Solis levothyroxine (SYNTHROID, LEVOTHROID) 75 MCG tablet Take 75 mcg by mouth daily before breakfast.     . Menthol, Topical Analgesic, (BENGAY EX) Apply 1 application topically 2 (two) times daily as needed (pain).    . sacubitril-valsartan (ENTRESTO) 24-26 MG Take 1 tablet by mouth 2 (two) times daily. 180 tablet 3  . XARELTO 20 MG TABS tablet Take 20 mg by mouth daily.     No current facility-administered medications for this visit.   Facility-Administered Medications Ordered in Other Visits  Medication Dose Route Frequency Provider Last Rate Last Admin  . technetium tetrofosmin (TC-MYOVIEW) injection 75.1 millicurie  02.5 millicurie Intravenous Once PRN Hilty, Nadean Corwin, MD        Allergies:   Crestor [rosuvastatin calcium], Other, Benadryl [diphenhydramine hcl], Canagliflozin, Liraglutide, Metformin, Pravastatin, and Tramadol   Social History:  The patient  reports that she quit smoking about 40 years ago. Her smoking use included cigarettes. She has a 0.20 pack-year smoking history. She has never used smokeless  tobacco. She reports that she does not drink alcohol and does not use drugs.   Family History:  The patient's family history includes Alzheimer's disease in her maternal aunt and mother; Atrial fibrillation in her mother; Breast cancer in her maternal aunt; Cancer in her maternal grandfather; Diabetes in her maternal grandmother; Heart disease in her father, maternal grandmother, mother, paternal grandfather, and paternal grandmother; Hypertension in her mother; Mitral valve prolapse in her sister.  ROS:  Please see the history of present illness.  All other systems are reviewed and otherwise negative.   PHYSICAL EXAM:  VS:  LMP 08/07/2016  BMI: There is no height or weight on file to calculate BMI. Well nourished, well developed, in no acute distress  HEENT: normocephalic, atraumatic  Neck: no JVD, carotid bruits or masses Cardiac:  RRR; no significant murmurs, no rubs,  or gallops Lungs:  CTA b/l, no wheezing, rhonchi or rales  Abd: soft, nontender, obese MS: no deformity or atrophy Ext: brawny type edema, no pitting edema, varicosities noted RLE Skin: warm and dry, no rash Neuro:  No gross deficits appreciated Psych: euthymic mood, full affect  ICD site is stable, no tethering or discomfort   EKG:  Done today and reviewed by myself: SR/VPacing (unchanged)  ICD interrogation done today and reviewed by myself:  Battery and lead measurements are good/stable NSVT x2 back in March and April 2021 <1% AP 3%RVP 100%LVP  12/16/17: TTE Study Conclusions - Left ventricle: The cavity size was mildly dilated. Wall   thickness was increased in a pattern of mild LVH. Systolic   function was normal. The estimated ejection fraction was in the   range of 55% to 60%. Wall motion was normal; there were no   regional wall motion abnormalities. Doppler parameters are   consistent with abnormal left ventricular relaxation (grade 1   diastolic dysfunction). - Ascending aorta: The ascending aorta  was mildly dilated. - Right ventricle: The cavity size was mildly dilated.  Impressions: - Normal LV systolic function; mild diastolic dysfunction; mild   LVH; mild LVE; mildly dilated ascending aorta; mildly dilated RV.  05/17/17: TTE: LVEF 30-35% 12/03/16: TTE: 30-35%   01/03/17: LHC IMPRESSION:Ms Lowden Has normal coronary arteries and severely depressed LV function. She has a nonischemic cardiomyopathy. She'll need optimal medical management. The sheath was removed and a TR band was placed on the right wrist which is patent hemostasis. The patient left the lab in stable condition. She will be discharged home later today and will follow-up with me as an outpatient in 2-3 weeks.    Recent Labs: No results found for requested labs within last 8760 hours.  No results found for requested labs within last 8760 hours.   CrCl cannot be calculated (Patient's most recent lab result is older than the maximum 21 days allowed.).   Wt Readings from Last 3 Encounters:  06/14/20 (!) 330 lb (149.7 kg)  04/06/20 (!) 334 lb (151.5 kg)  12/16/19 (!) 335 lb (152 kg)     Other studies reviewed: Additional studies/records reviewed today include: summarized above  ASSESSMENT A ND PLAN:  1. ICD     Intact function, no programming changes made  2. NICM with recovered LVEF     No symptoms or exam findings of fluid OL     On BB, entresto, HCTZ     Following with Dr. Zonia Kief     Labs next month with her PMD  3. HTN     Looks good, no changes   5. HLD     Deferred to Dr. Gwenlyn Found, pt intolerant to statins and declined zetia   She requests the flu shot today (was given)    Disposition: continue remotes as usual and in clinic in 1 year, sooner if needed    Current medicines are reviewed at length with the patient today.  The patient did not have any concerns regarding medicines.  Venetia Night, PA-C 07/29/2020 8:21 AM     [facility] [address] [place] [address] [phone] (office)  [phone] (fax)

## 2020-08-01 ENCOUNTER — Other Ambulatory Visit: Payer: Self-pay

## 2020-08-01 ENCOUNTER — Ambulatory Visit (INDEPENDENT_AMBULATORY_CARE_PROVIDER_SITE_OTHER): Payer: 59 | Admitting: Physician Assistant

## 2020-08-01 VITALS — BP 130/72 | HR 73 | Ht 69.0 in | Wt 336.0 lb

## 2020-08-01 DIAGNOSIS — I10 Essential (primary) hypertension: Secondary | ICD-10-CM | POA: Diagnosis not present

## 2020-08-01 DIAGNOSIS — Z95810 Presence of automatic (implantable) cardiac defibrillator: Secondary | ICD-10-CM | POA: Diagnosis not present

## 2020-08-01 DIAGNOSIS — I5022 Chronic systolic (congestive) heart failure: Secondary | ICD-10-CM

## 2020-08-01 DIAGNOSIS — Z23 Encounter for immunization: Secondary | ICD-10-CM

## 2020-08-01 NOTE — Patient Instructions (Signed)
Medication Instructions:  Your physician recommends that you continue on your current medications as directed. Please refer to the Current Medication list given to you today.   *If you need a refill on your cardiac medications before your next appointment, please call your pharmacy*   Lab Work: Patient did not provide current insurance card for today's visit.  Patient was informed that a current copy of their insurance card is expected at time of service and will need to bring for any future appointments.   If you have labs (blood work) drawn today and your tests are completely normal, you will receive your results only by: [name] MyChart Message (if you have MyChart) OR . A paper copy in the mail If you have any lab test that is abnormal or we need to change your treatment, we will call you to review the results.   Testing/Procedures: NONE ORDERED  TODAY    Follow-Up: At [facility], you and your health needs are our priority.  As part of our continuing mission to provide you with exceptional heart care, we have created designated Provider Care Teams.  These Care Teams include your primary Cardiologist (physician) and Advanced Practice Providers (APPs -  Physician Assistants and Nurse Practitioners) who all work together to provide you with the care you need, when you need it.  We recommend signing up for the patient portal called "MyChart".  Sign up information is provided on this After Visit Summary.  MyChart is used to connect with patients for Virtual Visits (Telemedicine).  Patients are able to view lab/test results, encounter notes, upcoming appointments, etc.  Non-urgent messages can be sent to your provider as well.   To learn more about what you can do with MyChart, go to [email or web address].    Your next appointment:   1 year(s)  The format for your next appointment:   In Person  Provider:   You may see Cristopher Peru, MD or one of the following Advanced Practice  Providers on your designated Care Team:    Chanetta Marshall, NP  Tommye Standard, PA-C  Legrand Como "Oda Kilts, [place]    Other Instructions

## 2020-08-30 ENCOUNTER — Ambulatory Visit (INDEPENDENT_AMBULATORY_CARE_PROVIDER_SITE_OTHER): Payer: 59

## 2020-08-30 DIAGNOSIS — I5022 Chronic systolic (congestive) heart failure: Secondary | ICD-10-CM

## 2020-08-30 DIAGNOSIS — Z95810 Presence of automatic (implantable) cardiac defibrillator: Secondary | ICD-10-CM | POA: Diagnosis not present

## 2020-09-02 ENCOUNTER — Telehealth: Payer: Self-pay

## 2020-09-02 NOTE — Telephone Encounter (Signed)
Remote ICM transmission received.  Attempted call to patient regarding ICM remote transmission and left detailed message per DPR.  Advised to return call for any fluid symptoms or questions. Next ICM remote transmission scheduled 10/03/2020.

## 2020-09-02 NOTE — Progress Notes (Signed)
EPIC Encounter for ICM Monitoring  Patient Name: Monica Solis is a 58 y.o. female Date: 09/02/2020 Primary Care Physican: Reynold Bowen, MD Primary Cardiologist:Berry Electrophysiologist:Taylor 08/02/2019 OfficeWeight: 336lbs  Attempted call to patient and unable to reach.  Left detailed message per DPR regarding transmission. Transmission reviewed.   11/15/2021HeartLogicHFIndex is1suggesting normal fluid levels andwithin normal threshold range.  Prescribed:Hydrochlorothiazide12.5mg Take 12.5 mg by mouth daily as needed (fluid).  Labs: 07/23/2019 Creatinine 0.72, BUN 14, Potassium 4.5, Sodium 141, GFR 93-108  Recommendations:Left voice mail with ICM number and encouraged to call if experiencing any fluid symptoms.  Follow-up plan: ICM clinic phone appointment on11/16/2021. 91 day device clinic remote transmission 10/05/2020.   EP/Cardiology next office visit: Recall 08/01/2021 with Dr Lovena Le.  Recall 3/1/2022with Dr.Berry  Copy of ICM check sent to Dr. Lovena Le.     [name], RN 09/02/2020 11:12 AM

## 2020-09-02 NOTE — Telephone Encounter (Signed)
Opened in error

## 2020-10-03 ENCOUNTER — Telehealth: Payer: Self-pay

## 2020-10-03 ENCOUNTER — Ambulatory Visit (INDEPENDENT_AMBULATORY_CARE_PROVIDER_SITE_OTHER): Payer: 59

## 2020-10-03 DIAGNOSIS — Z95810 Presence of automatic (implantable) cardiac defibrillator: Secondary | ICD-10-CM | POA: Diagnosis not present

## 2020-10-03 DIAGNOSIS — I5022 Chronic systolic (congestive) heart failure: Secondary | ICD-10-CM | POA: Diagnosis not present

## 2020-10-03 NOTE — Telephone Encounter (Signed)
Remote ICM transmission received.  Attempted call to patient regarding ICM remote transmission and no answer.  

## 2020-10-03 NOTE — Progress Notes (Signed)
EPIC Encounter for ICM Monitoring  Patient Name: Monica Solis is a 59 y.o. female Date: 10/03/2020 Primary Care Physican: Reynold Bowen, MD Primary Cardiologist:Berry Electrophysiologist:Taylor 08/02/2019 OfficeWeight: 336lbs  Attempted call to patient and unable to reach.   Transmission reviewed.   12/19/2021HeartLogicHFIndex is6suggesting normal fluid levels andwithin normal threshold range.  Prescribed:Hydrochlorothiazide12.5mg Take 12.5 mg by mouth daily as needed (fluid).  Labs: 07/23/2019 Creatinine 0.72, BUN 14, Potassium 4.5, Sodium 141, GFR 93-108  Recommendations:Unable to reach.    Follow-up plan: ICM clinic phone appointment on2/10/2020. 91 day device clinic remote transmission12/22/2021.   EP/Cardiology next office visit: Recall 08/01/2021 with Dr Lovena Le. Recall 3/1/2022with Dr.Berry  Copy of ICM check sent to Dr. Lovena Le.  3 Month Trend    8 Day Data Trend          [name], RN 10/03/2020 3:08 PM

## 2020-10-05 ENCOUNTER — Ambulatory Visit (INDEPENDENT_AMBULATORY_CARE_PROVIDER_SITE_OTHER): Payer: 59

## 2020-10-05 DIAGNOSIS — I428 Other cardiomyopathies: Secondary | ICD-10-CM | POA: Diagnosis not present

## 2020-10-05 LAB — CUP PACEART REMOTE DEVICE CHECK
Battery Remaining Longevity: 114 mo
Battery Remaining Percentage: 100 %
Brady Statistic RA Percent Paced: 0 %
Brady Statistic RV Percent Paced: 4 %
Date Time Interrogation Session: 20211222005100
HighPow Impedance: 90 Ohm
Implantable Lead Implant Date: 20180912
Implantable Lead Implant Date: 20180912
Implantable Lead Implant Date: 20180912
Implantable Lead Location: 753858
Implantable Lead Location: 753859
Implantable Lead Location: 753860
Implantable Lead Model: 293
Implantable Lead Model: 4674
Implantable Lead Model: 7741
Implantable Lead Serial Number: 435131
Implantable Lead Serial Number: 813037
Implantable Lead Serial Number: 920623
Implantable Pulse Generator Implant Date: 20180912
Lead Channel Impedance Value: 587 Ohm
Lead Channel Impedance Value: 592 Ohm
Lead Channel Impedance Value: 712 Ohm
Lead Channel Setting Pacing Amplitude: 2 V
Lead Channel Setting Pacing Amplitude: 2.5 V
Lead Channel Setting Pacing Amplitude: 2.5 V
Lead Channel Setting Pacing Pulse Width: 0.4 ms
Lead Channel Setting Pacing Pulse Width: 0.4 ms
Lead Channel Setting Sensing Sensitivity: 0.5 mV
Lead Channel Setting Sensing Sensitivity: 1 mV
Pulse Gen Serial Number: 193993

## 2020-10-19 NOTE — Progress Notes (Signed)
Remote ICD transmission.   

## 2020-12-13 ENCOUNTER — Other Ambulatory Visit: Payer: Self-pay

## 2020-12-13 ENCOUNTER — Encounter: Payer: Self-pay | Admitting: Cardiovascular Disease

## 2020-12-13 ENCOUNTER — Ambulatory Visit (INDEPENDENT_AMBULATORY_CARE_PROVIDER_SITE_OTHER): Payer: 59 | Admitting: Cardiovascular Disease

## 2020-12-13 VITALS — BP 128/84 | HR 66 | Ht 69.0 in | Wt 338.0 lb

## 2020-12-13 DIAGNOSIS — I10 Essential (primary) hypertension: Secondary | ICD-10-CM | POA: Diagnosis not present

## 2020-12-13 DIAGNOSIS — Z6841 Body Mass Index (BMI) 40.0 and over, adult: Secondary | ICD-10-CM

## 2020-12-13 DIAGNOSIS — I519 Heart disease, unspecified: Secondary | ICD-10-CM | POA: Diagnosis not present

## 2020-12-13 DIAGNOSIS — E782 Mixed hyperlipidemia: Secondary | ICD-10-CM

## 2020-12-13 DIAGNOSIS — I454 Nonspecific intraventricular block: Secondary | ICD-10-CM | POA: Diagnosis not present

## 2020-12-13 MED ORDER — XARELTO 20 MG PO TABS: 20.0000 mg | ORAL_TABLET | Freq: Every day | ORAL | 3 refills | Status: DC

## 2020-12-13 MED ORDER — CARVEDILOL 3.125 MG PO TABS
3.1250 mg | ORAL_TABLET | Freq: Two times a day (BID) | ORAL | 3 refills | Status: DC
Start: 2020-12-13 — End: 2021-12-22

## 2020-12-13 MED ORDER — SACUBITRIL-VALSARTAN 24-26 MG PO TABS: 1.0000 | ORAL_TABLET | Freq: Two times a day (BID) | ORAL | 3 refills | Status: DC

## 2020-12-13 MED ORDER — HYDROCHLOROTHIAZIDE 12.5 MG PO TABS
12.5000 mg | ORAL_TABLET | Freq: Every day | ORAL | 3 refills | Status: DC | PRN
Start: 2020-12-13 — End: 2021-12-22

## 2020-12-13 NOTE — Assessment & Plan Note (Signed)
History of DVT in the past with factor V Leiden deficiency on Xarelto oral anticoagulation.

## 2020-12-13 NOTE — Assessment & Plan Note (Signed)
History of mild hyper lipidemia not on statin therapy with lipid profile performed 12/15/2019 revealing total cholesterol 212, LDL 107 and HDL 43 followed by her PCP, Dr. Forde Dandy.

## 2020-12-13 NOTE — Progress Notes (Signed)
12/13/2020 Monica Solis   1960/11/28  [id number]  Primary Physician Reynold Bowen, MD Primary Cardiologist: Lorretta Harp MD [name], [place]  HPI:  Monica Solis is a 60 y.o.  severely overweight married Caucasian female with no children who was referred by her general surgeon, Dr. Kieth Brightly,  for preoperative coronary vessel clearance prior to elective Roux-en-Y bariatric surgery. I last spoke to her on the phone for a virtual telemedicine phone visit 06/10/2019.  Her risk factors for heart disease include treated diabetes, and hypertension. She does have a strong family history of heart disease with a father who died at age 67 of a myocardial infarction and brother at age 74. She has never had a heart attack or stroke. She denies chest pain or shortness of breath. She does have factor V Leiden deficiency has had DVT and pulmonary emboli in the past (2011). I performed a pharmacologic Myoview stress test that showed no evidence of ischemia but gated SPECT showed an ejection fraction of 35%. This was confirmed by 2-D echocardiography. She does now admit to episodes of chest burning which she has thought was reflux in the past. She underwent outpatien are without COVID-19 okay on left I believe 6 months ago I think we arrange at that way your bariatric surgery and on I know we we did a t coronary angiography. The right radial approach by myself 01/03/17 revealing normal coronary arteries with an EF in the 30-35% range. 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 Crissie Sickles for consideration of IV ICD implantation for CRT which he did successfully on 06/26/17.  Follow-up 2D echo performed 12/16/2017 revealed normal ejection fraction.  Since I  spoke to her a year and a half ago she continues to do well.  She has no symptoms of heart failure.  She does have obstructive sleep apnea but does not wear CPAP because she is intolerant to this.  She denies chest pain or shortness of breath.   Current Meds  Medication Sig  . Cholecalciferol (VITAMIN D3) 2000 UNITS capsule Take 4,000 Units by mouth daily with supper.   . diclofenac sodium (VOLTAREN) 1 % GEL Apply 2 g topically 4 (four) times daily.   [name] esomeprazole (NEXIUM) 40 MG capsule Take by mouth daily.  . fexofenadine (ALLEGRA) 180 MG tablet Take 180 mg by mouth daily as needed for allergies or rhinitis.  [name] gabapentin (NEURONTIN) 100 MG capsule Take 300 mg by mouth at bedtime. TAKE ONE TABLET IN THE MORNING AND TWO TABLETS AT NIGHT.  [name] insulin aspart (NOVOLOG) 100 UNIT/ML injection Inject 7-10 Units into the skin 3 (three) times daily as needed for high blood sugar. Per SSC 7-10units when cbg above 120  . insulin detemir (LEVEMIR) 100 UNIT/ML injection Inject 40 Units into the skin at bedtime.   [name] levothyroxine (SYNTHROID, LEVOTHROID) 75 MCG tablet Take 75 mcg by mouth daily before breakfast.   . Menthol, Topical Analgesic, (BENGAY EX) Apply 1 application topically 2 (two) times daily as needed (pain).  . [DISCONTINUED] carvedilol (COREG) 3.125 MG tablet Take 1 tablet (3.125 mg total) by mouth 2 (two) times daily.  . [DISCONTINUED] hydrochlorothiazide (HYDRODIURIL) 12.5 MG tablet TAKE 1 TABLET BY MOUTH EVERY DAY AS NEEDED  . [  DISCONTINUED] sacubitril-valsartan (ENTRESTO) 24-26 MG Take 1 tablet by mouth 2 (two) times daily.  . [DISCONTINUED] XARELTO 20 MG TABS tablet Take 20 mg by mouth daily.     Allergies  Allergen Reactions  . Crestor [Rosuvastatin Calcium] Other (See Comments)    Muscle aches   . Other Swelling    Metal  . Benadryl [Diphenhydramine Hcl] Hives  . Canagliflozin Other (See Comments)    Loss of bladder control  Loss of bladder control Dysuria Dysuria  .  Liraglutide Other (See Comments)    "Fainting" "Fainting" Hypotension,   . Metformin Other (See Comments)    Kidney damage Kidney damage Kidney damage Decreased kidney function  . Pravastatin Other (See Comments) and Rash    Muscle ache Muscle ache Muscle ache  . Tramadol Other (See Comments) and Rash    "nightmares" "nightmares" constipation    Social History   Socioeconomic History  . Marital status: Married    Spouse name: Not on file  . Number of children: Not on file  . Years of education: Not on file  . Highest education level: Not on file  Occupational History  . Not on file  Tobacco Use  . Smoking status: Former Smoker    Packs/day: 0.10    Years: 2.00    Pack years: 0.20    Types: Cigarettes    Quit date: 10/02/1979    Years since quitting: 41.2  . Smokeless tobacco: Never Used  Vaping Use  . Vaping Use: Never used  Substance and Sexual Activity  . Alcohol use: No    Alcohol/week: 0.0 standard drinks  . Drug use: No  . Sexual activity: Not Currently    Partners: Male    Birth control/protection: Surgical    Comment: 1st intercourse 32 yo-1 partner-BTL  Other Topics Concern  . Not on file  Social History Narrative  . Not on file   Social Determinants of Health   Financial Resource Strain: Not on file  Food Insecurity: Not on file  Transportation Needs: Not on file  Physical Activity: Not on file  Stress: Not on file  Social Connections: Not on file  Intimate Partner Violence: Not on file     Review of Systems: General: negative for chills, fever, night sweats or weight changes.  Cardiovascular: negative for chest pain, dyspnea on exertion, edema, orthopnea, palpitations, paroxysmal nocturnal dyspnea or shortness of breath Dermatological: negative for rash Respiratory: negative for cough or wheezing Urologic: negative for hematuria Abdominal: negative for nausea, vomiting, diarrhea, bright red blood per rectum, melena, or  hematemesis Neurologic: negative for visual changes, syncope, or dizziness All other systems reviewed and are otherwise negative except as noted above.    Blood pressure 128/84, pulse 66, height 5\' 9"  (1.753 m), weight (!) 338 lb (153.3 kg), last menstrual period 08/07/2016, SpO2 98 %.  General appearance: alert and no distress Neck: no adenopathy, no carotid bruit, no JVD, supple, symmetrical, trachea midline and thyroid not enlarged, symmetric, no tenderness/mass/nodules Lungs: clear to auscultation bilaterally Heart: regular rate and rhythm, S1, S2 normal, no murmur, click, rub or gallop Extremities: extremities normal, atraumatic, no cyanosis or edema Pulses: 2+ and symmetric Skin: Skin color, texture, turgor normal. No rashes or lesions Neurologic: Alert and oriented X 3, normal strength and tone. Normal symmetric reflexes. Normal coordination and gait  EKG ventricular paced rhythm at 66.i   Personally reviewed this EKG.  ASSESSMENT AND PLAN:   DVT (deep venous thrombosis) (HCC) History of DVT in the past  with factor V Leiden deficiency on Xarelto oral anticoagulation.  Obesity History of morbid obesity with a BMI of 50.  She was being considered for a Roux-en-Y bariatric procedure which she never pursued.  She is open to being referred to a dietitian for weight loss.  Essential hypertension History of essential hypertension a blood pressure measured today 120/84.  She is on Entresto, hydrochlorothiazide and carvedilol.  Hyperlipidemia History of mild hyper lipidemia not on statin therapy with lipid profile performed 12/15/2019 revealing total cholesterol 212, LDL 107 and HDL 43 followed by her PCP, Dr. Forde Dandy.  Left ventricular dysfunction History of nonischemic card myopathy cardiac catheterization performed by myself in the right radial approach 01/03/2017 revealing normal coronary arteries.  She did have a bundle branch block.  Referred her to Dr. Crissie Sickles who ultimately  implanted a BiV ICD (CRT) successfully 06/26/2017.  Her follow-up echo performed 12/16/2017 revealed normalization of her ejection fraction.  She currently has no symptoms of heart failure.      Lorretta Harp MD [place], [facility] 12/13/2020 9:30 AM

## 2020-12-13 NOTE — Assessment & Plan Note (Signed)
History of nonischemic card myopathy cardiac catheterization performed by myself in the right radial approach 01/03/2017 revealing normal coronary arteries.  She did have a bundle branch block.  Referred her to Dr. Crissie Sickles who ultimately implanted a BiV ICD (CRT) successfully 06/26/2017.  Her follow-up echo performed 12/16/2017 revealed normalization of her ejection fraction.  She currently has no symptoms of heart failure.

## 2020-12-13 NOTE — Assessment & Plan Note (Signed)
History of morbid obesity with a BMI of 50.  She was being considered for a Roux-en-Y bariatric procedure which she never pursued.  She is open to being referred to a dietitian for weight loss.

## 2020-12-13 NOTE — Patient Instructions (Addendum)
Medication Instructions:  Your physician recommends that you continue on your current medications as directed. Please refer to the Current Medication list given to you today.  *If you need a refill on your cardiac medications before your next appointment, please call your pharmacy*   Follow-Up: At [facility], you and your health needs are our priority.  As part of our continuing mission to provide you with exceptional heart care, we have created designated Provider Care Teams.  These Care Teams include your primary Cardiologist (physician) and Advanced Practice Providers (APPs -  Physician Assistants and Nurse Practitioners) who all work together to provide you with the care you need, when you need it.  We recommend signing up for the patient portal called "MyChart".  Sign up information is provided on this After Visit Summary.  MyChart is used to connect with patients for Virtual Visits (Telemedicine).  Patients are able to view lab/test results, encounter notes, upcoming appointments, etc.  Non-urgent messages can be sent to your provider as well.   To learn more about what you can do with MyChart, go to [email or web address].    Your next appointment:   12 month(s)  The format for your next appointment:   In Person  Provider:   Quay Burow, MD   Other Instructions Referral made for diet and weight loss management with Dr. Dennard Nip.

## 2020-12-13 NOTE — Assessment & Plan Note (Signed)
History of essential hypertension a blood pressure measured today 120/84.  She is on Entresto, hydrochlorothiazide and carvedilol.

## 2020-12-16 ENCOUNTER — Ambulatory Visit (INDEPENDENT_AMBULATORY_CARE_PROVIDER_SITE_OTHER): Payer: 59

## 2020-12-16 DIAGNOSIS — I5022 Chronic systolic (congestive) heart failure: Secondary | ICD-10-CM

## 2020-12-16 DIAGNOSIS — Z95810 Presence of automatic (implantable) cardiac defibrillator: Secondary | ICD-10-CM | POA: Diagnosis not present

## 2020-12-16 NOTE — Progress Notes (Signed)
EPIC Encounter for ICM Monitoring  Patient Name: Monica Solis is a 60 y.o. female Date: 12/16/2020 Primary Care Physican: Reynold Bowen, MD Primary Cardiologist:Berry Electrophysiologist:Taylor 12/16/2020 Weight: 330-336lbs  Spoke with patient and reports feeling well at this time.  Denies fluid symptoms.    3/1/2022HeartLogicHFIndex is1suggesting normal fluid levels andwithin normal threshold range.  Prescribed:Hydrochlorothiazide12.5mg Take 12.5 mg by mouth daily as needed (fluid).  Labs: 07/23/2019 Creatinine 0.72, BUN 14, Potassium 4.5, Sodium 141, GFR 93-108  Recommendations:No changes and encouraged to call if experiencing any fluid symptoms.   Follow-up plan: ICM clinic phone appointment on4/01/2021. 91 day device clinic remote transmission3/23/2022.   EP/Cardiology next office visit: Recall10/18/2022with Dr Lovena Le. Recall 2/24/2023with Dr.Berry  Copy of ICM check sent to Dr. Lovena Le.  3 Month Trend    8 Day Data Trend          [name], RN 12/16/2020 11:13 AM

## 2020-12-27 ENCOUNTER — Other Ambulatory Visit: Payer: Self-pay

## 2020-12-27 ENCOUNTER — Encounter (INDEPENDENT_AMBULATORY_CARE_PROVIDER_SITE_OTHER): Payer: Self-pay | Admitting: Family Medicine

## 2020-12-27 ENCOUNTER — Ambulatory Visit (INDEPENDENT_AMBULATORY_CARE_PROVIDER_SITE_OTHER): Payer: 59 | Admitting: Family Medicine

## 2020-12-27 VITALS — BP 124/78 | HR 75 | Temp 97.8°F | Ht 68.0 in | Wt 330.0 lb

## 2020-12-27 DIAGNOSIS — Z794 Long term (current) use of insulin: Secondary | ICD-10-CM

## 2020-12-27 DIAGNOSIS — E559 Vitamin D deficiency, unspecified: Secondary | ICD-10-CM

## 2020-12-27 DIAGNOSIS — Z6841 Body Mass Index (BMI) 40.0 and over, adult: Secondary | ICD-10-CM

## 2020-12-27 DIAGNOSIS — E039 Hypothyroidism, unspecified: Secondary | ICD-10-CM

## 2020-12-27 DIAGNOSIS — R5383 Other fatigue: Secondary | ICD-10-CM | POA: Diagnosis not present

## 2020-12-27 DIAGNOSIS — I5042 Chronic combined systolic (congestive) and diastolic (congestive) heart failure: Secondary | ICD-10-CM | POA: Diagnosis not present

## 2020-12-27 DIAGNOSIS — Z0289 Encounter for other administrative examinations: Secondary | ICD-10-CM

## 2020-12-27 DIAGNOSIS — E1169 Type 2 diabetes mellitus with other specified complication: Secondary | ICD-10-CM

## 2020-12-27 DIAGNOSIS — Z9189 Other specified personal risk factors, not elsewhere classified: Secondary | ICD-10-CM | POA: Diagnosis not present

## 2020-12-27 DIAGNOSIS — I10 Essential (primary) hypertension: Secondary | ICD-10-CM

## 2020-12-27 DIAGNOSIS — E782 Mixed hyperlipidemia: Secondary | ICD-10-CM

## 2020-12-27 DIAGNOSIS — Z1331 Encounter for screening for depression: Secondary | ICD-10-CM | POA: Diagnosis not present

## 2020-12-27 DIAGNOSIS — R0602 Shortness of breath: Secondary | ICD-10-CM
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
## 2020-12-28 LAB — CBC WITH DIFFERENTIAL/PLATELET
Basophils Absolute: 0 10*3/uL (ref 0.0–0.2)
Basos: 1 %
EOS (ABSOLUTE): 0.1 10*3/uL (ref 0.0–0.4)
Eos: 1 %
Hematocrit: 49.1 % — ABNORMAL HIGH (ref 34.0–46.6)
Hemoglobin: 16.4 g/dL — ABNORMAL HIGH (ref 11.1–15.9)
Immature Grans (Abs): 0 10*3/uL (ref 0.0–0.1)
Immature Granulocytes: 0 %
Lymphocytes Absolute: 1.4 10*3/uL (ref 0.7–3.1)
Lymphs: 25 %
MCH: 29.9 pg (ref 26.6–33.0)
MCHC: 33.4 g/dL (ref 31.5–35.7)
MCV: 90 fL (ref 79–97)
Monocytes Absolute: 0.5 10*3/uL (ref 0.1–0.9)
Monocytes: 9 %
Neutrophils Absolute: 3.7 10*3/uL (ref 1.4–7.0)
Neutrophils: 64 %
Platelets: 259 10*3/uL (ref 150–450)
RBC: 5.48 x10E6/uL — ABNORMAL HIGH (ref 3.77–5.28)
RDW: 11.9 % (ref 11.7–15.4)
WBC: 5.8 10*3/uL (ref 3.4–10.8)

## 2020-12-28 LAB — COMPREHENSIVE METABOLIC PANEL
ALT: 24 IU/L (ref 0–32)
AST: 20 IU/L (ref 0–40)
Albumin/Globulin Ratio: 1.5 (ref 1.2–2.2)
Albumin: 4.5 g/dL (ref 3.8–4.9)
Alkaline Phosphatase: 87 IU/L (ref 44–121)
BUN/Creatinine Ratio: 19 (ref 9–23)
BUN: 16 mg/dL (ref 6–24)
Bilirubin Total: 0.9 mg/dL (ref 0.0–1.2)
CO2: 23 mmol/L (ref 20–29)
Calcium: 9.7 mg/dL (ref 8.7–10.2)
Chloride: 100 mmol/L (ref 96–106)
Creatinine, Ser: 0.85 mg/dL (ref 0.57–1.00)
Globulin, Total: 3 g/dL (ref 1.5–4.5)
Glucose: 118 mg/dL — ABNORMAL HIGH (ref 65–99)
Potassium: 4.5 mmol/L (ref 3.5–5.2)
Sodium: 141 mmol/L (ref 134–144)
Total Protein: 7.5 g/dL (ref 6.0–8.5)
eGFR: 79 mL/min/{1.73_m2} (ref >59)

## 2020-12-28 LAB — INSULIN, RANDOM: INSULIN: 9.1 u[IU]/mL (ref 2.6–24.9)

## 2020-12-28 LAB — HEMOGLOBIN A1C
Est. average glucose Bld gHb Est-mCnc: 148 mg/dL
Hgb A1c MFr Bld: 6.8 % — ABNORMAL HIGH (ref 4.8–5.6)

## 2020-12-28 LAB — LIPID PANEL WITH LDL/HDL RATIO
Cholesterol, Total: 233 mg/dL — ABNORMAL HIGH (ref 100–199)
HDL: 47 mg/dL (ref >39)
LDL Chol Calc (NIH): 161 mg/dL — ABNORMAL HIGH (ref 0–99)
LDL/HDL Ratio: 3.4 ratio — ABNORMAL HIGH (ref 0.0–3.2)
Triglycerides: 140 mg/dL (ref 0–149)
VLDL Cholesterol Cal: 25 mg/dL (ref 5–40)

## 2020-12-28 LAB — VITAMIN D 25 HYDROXY (VIT D DEFICIENCY, FRACTURES): Vit D, 25-Hydroxy: 34.7 ng/mL (ref 30.0–100.0)

## 2020-12-28 LAB — FOLATE: Folate: 6.4 ng/mL (ref >3.0)

## 2020-12-28 LAB — VITAMIN B12: Vitamin B-12: 532 pg/mL (ref 232–1245)

## 2020-12-28 LAB — T4: T4, Total: 9.5 ug/dL (ref 4.5–12.0)

## 2020-12-28 LAB — TSH: TSH: 3.18 u[IU]/mL (ref 0.450–4.500)

## 2020-12-28 LAB — T3: T3, Total: 124 ng/dL (ref 71–180)

## 2020-12-29 ENCOUNTER — Telehealth (INDEPENDENT_AMBULATORY_CARE_PROVIDER_SITE_OTHER): Payer: 59 | Admitting: Psychology

## 2020-12-29 DIAGNOSIS — F3289 Other specified depressive episodes: Secondary | ICD-10-CM | POA: Diagnosis not present

## 2020-12-29 NOTE — Progress Notes (Signed)
  Office: [phone]  /  Fax: [phone]    Date: January 12, 2021   Appointment Start Time: 2:00pm Duration: 37 minutes Provider: Glennie Isle, Psy.D. Type of Session: Individual Therapy  Location of Patient: Home Location of Provider: Provider's Home (private office) Type of Contact: Telepsychological Visit via MyChart Video Visit  Session Content: Mikalah is a 60 y.o. female presenting for a follow-up appointment to address the previously established treatment goal of increasing coping skills. Today's appointment was a telepsychological visit due to COVID-19. Veronique provided verbal consent for today's telepsychological appointment and she is aware she is responsible for securing confidentiality on her end of the session. Prior to proceeding with today's appointment, Barbette's physical location at the time of this appointment was obtained as well a phone number she could be reached at in the event of technical difficulties. Fallynn and this provider participated in today's telepsychological service.   This provider conducted a brief check-in. Mariah reported "not much" has changed since the last appointment. She shared she lost weight, but discussed challenges due to a diverticulitis flare up. She indicated she discussed the aforementioned with Dr. Leafy Ro and was switched to journaling. Lan reported challenges with journaling homemade meals as well as consuming enough protein/calories. She was encouraged to speak with Dr. Leafy Ro further about tips to help with journaling; she agreed. To assist with meeting calorie/protein goals, she was engaged in problem solving. Rainna reported willingness to eat snacks between her three meals and agreed to send Dr. Leafy Ro a [place] for snack ideas and frozen meal ideas after exploring options on her own tomorrow. She was also encouraged to write down any questions that come to mind for her next office visit with Dr. Leafy Ro. Overall, Malasha  was receptive to today's appointment as evidenced by openness to sharing, responsiveness to feedback, and willingness to implement discussed strategies .  Mental Status Examination:  Appearance: well groomed and appropriate hygiene  Behavior: appropriate to circumstances Mood: euthymic Affect: mood congruent Speech: normal in rate, volume, and tone Eye Contact: appropriate Psychomotor Activity: unable to assess  Gait: unable to assess Thought Process: linear, logical, and goal directed  Thought Content/Perception: no hallucinations, delusions, bizarre thinking or behavior reported or observed and no evidence of suicidal and homicidal ideation, plan, and intent Orientation: time, person, place, and purpose of appointment Memory/Concentration: memory, attention, language, and fund of knowledge intact  Insight/Judgment: fair  Interventions:  Conducted a brief chart review Provided empathic reflections and validation Employed supportive psychotherapy interventions to facilitate reduced distress and to improve coping skills with identified stressors Engaged patient in problem solving  DSM-5 Diagnosis(es): 311 (F32.8) Other Specified Depressive Disorder, Emotional Eating Behaviors  Treatment Goal & Progress: During the initial appointment with this provider, the following treatment goal was established: increase coping skills. Progress is limited, as Leiah has just begun treatment with this provider; however, she is receptive to the interaction and interventions and rapport is being established.   Plan: The next appointment will be scheduled in two weeks, which will be via MyChart Video Visit. The next session will focus on working towards the established treatment goal.

## 2021-01-02 NOTE — Progress Notes (Signed)
Chief Complaint:   OBESITY Monica Solis (MR# [id number]) is a 60 y.o. female who presents for evaluation and treatment of obesity and related comorbidities. Current BMI is Body mass index is 50.18 kg/m. Quiana has been struggling with her weight for many years and has been unsuccessful in either losing weight, maintaining weight loss, or reaching her healthy weight goal.  Eloisa is currently in the action stage of change and ready to dedicate time achieving and maintaining a healthier weight. Evalie is interested in becoming our patient and working on intensive lifestyle modifications including (but not limited to) diet and exercise for weight loss.  Dafney's habits were reviewed today and are as follows: Her family eats meals together, she thinks her family will eat healthier with her, she struggles with family and or coworkers weight loss sabotage, her desired weight loss is 170 lbs, she has been heavy most of her life, she started gaining weight, she has always been big so she thinks, her heaviest weight ever was 340 pounds, she is a picky eater and doesn't like to eat healthier foods, she snacks frequently in the evenings, she skips meals frequently, she is frequently drinking liquids with calories, she frequently makes poor food choices, she frequently eats larger portions than normal and she struggles with emotional eating.  Depression Screen Nawaal's Food and Mood (modified PHQ-9) score was 10.  Depression screen PHQ 2/9 12/27/2020  Decreased Interest 1  Down, Depressed, Hopeless 1  PHQ - 2 Score 2  Altered sleeping 1  Tired, decreased energy 2  Change in appetite 1  Feeling bad or failure about yourself  1  Trouble concentrating 0  Moving slowly or fidgety/restless 3  Suicidal thoughts 0  PHQ-9 Score 10   Subjective:   1. Other fatigue Sharisa admits to daytime somnolence and admits to waking up still tired. Patent has a history of symptoms of daytime fatigue.  Tyianna generally gets 6 or 8 hours of sleep per night, and states that she has generally restful sleep. Snoring is present. Apneic episodes are not present. Epworth Sleepiness Score is 3.  2. Shortness of breath on exertion Kimerly notes increasing shortness of breath with exercising and seems to be worsening over time with weight gain. She notes getting out of breath sooner with activity than she used to. This has not gotten worse recently. Raksha denies shortness of breath at rest or orthopnea.  3. Type 2 diabetes mellitus with other specified complication, with long-term current use of insulin (HCC) Sahirah's fasting BGs mostly range between 140 and 150's, and 2 hour post prandial mostly range between 170 and 180's. She is followed by her Endocrinologist. She is on Basal and mealtime insulin.  4. Chronic combined systolic and diastolic congestive heart failure (HCC) Roselyne is status post implant with BBB. Her echo in 2019 shows EF at 53-60% ICD. She has a history of obstructive sleep apnea, and she is not on CPAP.  5. Vitamin D deficiency Kayson is on Vit D, and she has no recent labs.  6. Primary hypertension Fransisca's blood pressure is well controlled today. She is working on diet and exercise.  7. Mixed hyperlipidemia Tena is working on diet and exercise, and she is due for labs.  8. Acquired hypothyroidism Aleatha is on Synthroid, and she is working on diet and exercise. She notes fatigue.  9. At risk for hypoglycemia Misako is at increased risk for hypoglycemia due to weight loss while on insulin.  Assessment/Plan:  1. Other fatigue Sharde does feel that her weight is causing her energy to be lower than it should be. Fatigue may be related to obesity, depression or many other causes. Labs will be ordered, and in the meanwhile, Ngozi will focus on self care including making healthy food choices, increasing physical activity and focusing on stress reduction.  -  Vitamin B12 - CBC with Differential/Platelet - Folate  2. Shortness of breath on exertion Katharina does feel that she gets out of breath more easily that she used to when she exercises. Athenia's shortness of breath appears to be obesity related and exercise induced. She has agreed to work on weight loss and gradually increase exercise to treat her exercise induced shortness of breath. Will continue to monitor closely.  3. Type 2 diabetes mellitus with other specified complication, with long-term current use of insulin (HCC) Good blood sugar control is important to decrease the likelihood of diabetic complications such as nephropathy, neuropathy, limb loss, blindness, coronary artery disease, and death. Intensive lifestyle modification including diet, exercise and weight loss are the first line of treatment for diabetes. We will check labs today, and will continue to follow closely. Keishawna agreed to follow up as directed.  - Comprehensive metabolic panel - Hemoglobin A1c - Insulin, random  4. Chronic combined systolic and diastolic congestive heart failure (HCC) Nicki will work on diet, exercise, and weight loss.  5. Vitamin D deficiency Low Vitamin D level contributes to fatigue and are associated with obesity, breast, and colon cancer. We will check labs today. Letisia will follow-up for routine testing of Vitamin D, at least 2-3 times per year to avoid over-replacement.  - VITAMIN D 25 Hydroxy (Vit-D Deficiency, Fractures)  6. Primary hypertension Shrita will work on healthy weight loss and exercise to improve blood pressure control. We will watch for signs of hypotension as she continues her lifestyle modifications. We will check labs today.  7. Mixed hyperlipidemia Cardiovascular risk and specific lipid/LDL goals reviewed. We discussed several lifestyle modifications today. With will check labs today. Jenni will continue to work on diet, exercise and weight loss efforts. Orders  and follow up as documented in patient record.   - Lipid Panel With LDL/HDL Ratio  8. Acquired hypothyroidism We will check labs today. Shaniece will continue to follow up as directed. Orders and follow up as documented in patient record.  - T3 - T4 - TSH  9. Screening for depression Doll had a positive depression screening. Depression is commonly associated with obesity and often results in emotional eating behaviors. We will monitor this closely and work on CBT to help improve the non-hunger eating patterns. Referral to Psychology may be required if no improvement is seen as she continues in our clinic.  10. At risk for hypoglycemia Falyn was given approximately 30 minutes of counseling today regarding prevention of hypoglycemia. She was advised of symptoms of hypoglycemia. Lynzee was instructed to avoid skipping meals, eat regular protein rich meals and schedule low calorie snacks as needed.   Repetitive spaced learning was employed today to elicit superior memory formation and behavioral change  11. Class 3 severe obesity with serious comorbidity and body mass index (BMI) of 50.0 to 59.9 in adult, unspecified obesity type (HCC) Rosmary is currently in the action stage of change and her goal is to continue with weight loss efforts. I recommend Truth begin the structured treatment plan as follows:  She has agreed to the Category 3 Plan.  Exercise goals: No exercise has been  prescribed for now, while we concentrate on nutritional changes.  Behavioral modification strategies: increasing lean protein intake and meal planning and cooking strategies.  She was informed of the importance of frequent follow-up visits to maximize her success with intensive lifestyle modifications for her multiple health conditions. She was informed we would discuss her lab results at her next visit unless there is a critical issue that needs to be addressed sooner. Fujiko agreed to keep her next visit at  the agreed upon time to discuss these results.  Objective:   Blood pressure 124/78, pulse 75, temperature 97.8 F (36.6 C), height 5\' 8"  (1.727 m), weight (!) 330 lb (149.7 kg), last menstrual period 08/07/2016, SpO2 97 %. Body mass index is 50.18 kg/m.  EKG: Normal sinus rhythm, rate 66 BPM.  Indirect Calorimeter completed today shows a VO2 of 374 and a REE of 2606.  Her calculated basal metabolic rate is 3383 thus her basal metabolic rate is better than expected.  General: Cooperative, alert, well developed, in no acute distress. HEENT: Conjunctivae and lids unremarkable. Cardiovascular: Regular rhythm.  Lungs: Normal work of breathing. Neurologic: No focal deficits.   Lab Results  Component Value Date   CREATININE 0.85 12/27/2020   BUN 16 12/27/2020   NA 141 12/27/2020   K 4.5 12/27/2020   CL 100 12/27/2020   CO2 23 12/27/2020   Lab Results  Component Value Date   ALT 24 12/27/2020   AST 20 12/27/2020   ALKPHOS 87 12/27/2020   BILITOT 0.9 12/27/2020   Lab Results  Component Value Date   HGBA1C 6.8 (H) 12/27/2020   Lab Results  Component Value Date   INSULIN 9.1 12/27/2020   Lab Results  Component Value Date   TSH 3.180 12/27/2020   Lab Results  Component Value Date   CHOL 233 (H) 12/27/2020   HDL 47 12/27/2020   LDLCALC 161 (H) 12/27/2020   TRIG 140 12/27/2020   Lab Results  Component Value Date   WBC 5.8 12/27/2020   HGB 16.4 (H) 12/27/2020   HCT 49.1 (H) 12/27/2020   MCV 90 12/27/2020   PLT 259 12/27/2020   No results found for: IRON, TIBC, FERRITIN  Attestation Statements:   Reviewed by clinician on day of visit: allergies, medications, problem list, medical history, surgical history, family history, social history, and previous encounter notes.   I, Trixie Dredge, am acting as transcriptionist for Dennard Nip, MD.  I have reviewed the above documentation for accuracy and completeness, and I agree with the above. - Dennard Nip, MD

## 2021-01-04 ENCOUNTER — Ambulatory Visit (INDEPENDENT_AMBULATORY_CARE_PROVIDER_SITE_OTHER): Payer: 59

## 2021-01-04 DIAGNOSIS — I5022 Chronic systolic (congestive) heart failure: Secondary | ICD-10-CM | POA: Diagnosis not present

## 2021-01-05 LAB — CUP PACEART REMOTE DEVICE CHECK
Battery Remaining Longevity: 114 mo
Battery Remaining Percentage: 100 %
Brady Statistic RA Percent Paced: 0 %
Brady Statistic RV Percent Paced: 3 %
Date Time Interrogation Session: 20220323010400
HighPow Impedance: 87 Ohm
Implantable Lead Implant Date: 20180912
Implantable Lead Implant Date: 20180912
Implantable Lead Implant Date: 20180912
Implantable Lead Location: 753858
Implantable Lead Location: 753859
Implantable Lead Location: 753860
Implantable Lead Model: 293
Implantable Lead Model: 4674
Implantable Lead Model: 7741
Implantable Lead Serial Number: 435131
Implantable Lead Serial Number: 813037
Implantable Lead Serial Number: 920623
Implantable Pulse Generator Implant Date: 20180912
Lead Channel Impedance Value: 594 Ohm
Lead Channel Impedance Value: 675 Ohm
Lead Channel Impedance Value: 742 Ohm
Lead Channel Setting Pacing Amplitude: 2 V
Lead Channel Setting Pacing Amplitude: 2.5 V
Lead Channel Setting Pacing Amplitude: 2.5 V
Lead Channel Setting Pacing Pulse Width: 0.4 ms
Lead Channel Setting Pacing Pulse Width: 0.4 ms
Lead Channel Setting Sensing Sensitivity: 0.5 mV
Lead Channel Setting Sensing Sensitivity: 1 mV
Pulse Gen Serial Number: 193993

## 2021-01-10 ENCOUNTER — Other Ambulatory Visit: Payer: Self-pay

## 2021-01-10 ENCOUNTER — Encounter (INDEPENDENT_AMBULATORY_CARE_PROVIDER_SITE_OTHER): Payer: Self-pay | Admitting: Family Medicine

## 2021-01-10 ENCOUNTER — Ambulatory Visit (INDEPENDENT_AMBULATORY_CARE_PROVIDER_SITE_OTHER): Payer: 59 | Admitting: Family Medicine

## 2021-01-10 VITALS — BP 113/75 | HR 79 | Temp 98.2°F | Ht 68.0 in | Wt 323.0 lb

## 2021-01-10 DIAGNOSIS — Z6841 Body Mass Index (BMI) 40.0 and over, adult: Secondary | ICD-10-CM

## 2021-01-10 DIAGNOSIS — E559 Vitamin D deficiency, unspecified: Secondary | ICD-10-CM | POA: Diagnosis not present

## 2021-01-10 DIAGNOSIS — E785 Hyperlipidemia, unspecified: Secondary | ICD-10-CM

## 2021-01-10 DIAGNOSIS — E1169 Type 2 diabetes mellitus with other specified complication: Secondary | ICD-10-CM

## 2021-01-10 DIAGNOSIS — Z9189 Other specified personal risk factors, not elsewhere classified: Secondary | ICD-10-CM | POA: Diagnosis not present

## 2021-01-10 DIAGNOSIS — D582 Other hemoglobinopathies: Secondary | ICD-10-CM | POA: Diagnosis not present

## 2021-01-10 DIAGNOSIS — E66813 Obesity, class 3: Secondary | ICD-10-CM

## 2021-01-10 DIAGNOSIS — Z794 Long term (current) use of insulin: Secondary | ICD-10-CM

## 2021-01-10 MED ORDER — VITAMIN D (ERGOCALCIFEROL) 1.25 MG (50000 UNIT) PO CAPS: 50000.0000 [IU] | ORAL_CAPSULE | ORAL | 0 refills | Status: DC

## 2021-01-12 ENCOUNTER — Telehealth (INDEPENDENT_AMBULATORY_CARE_PROVIDER_SITE_OTHER): Payer: 59 | Admitting: Psychology

## 2021-01-12 DIAGNOSIS — F3289 Other specified depressive episodes: Secondary | ICD-10-CM

## 2021-01-12 NOTE — Progress Notes (Signed)
  Office: [phone]  /  Fax: [phone]    Date: January 26, 2021   Appointment Start Time: 2:00pm Duration: 24 minutes Provider: Glennie Isle, Psy.D. Type of Session: Individual Therapy  Location of Patient: Home Location of Provider: Provider's Home (private office) Type of Contact: Telepsychological Visit via MyChart Video Visit  Session Content: Monica Solis is a 60 y.o. female presenting for a follow-up appointment to address the previously established treatment goal of increasing coping skills. Today's appointment was a telepsychological visit due to COVID-19. [name] provided verbal consent for today's telepsychological appointment and she is aware she is responsible for securing confidentiality on her end of the session. Prior to proceeding with today's appointment, [name]'s physical location at the time of this appointment was obtained as well a phone number she could be reached at in the event of technical difficulties. [name] and this provider participated in today's telepsychological service.   This provider conducted a brief check-in. [name] reported, "I didn't do good this time. I actually gained a pound." She discussed challenges with journaling, noting she was not eating enough. Despite the aforementioned, she indicated, "I'm not disappointed. I'm going to keep trying." Positive reinforcement was provided. To increase awareness about eating habits, psychoeducation regarding triggers for emotional eating was provided. [name] was provided a handout, and encouraged to utilize the handout between now and the next appointment to increase awareness of triggers and frequency. [name] agreed. This provider also discussed behavioral strategies for specific triggers, such as placing the utensil down when conversing to avoid mindless eating. [name] provided verbal consent during today's appointment for this provider to send a handout about triggers via e-mail. Overall, [name] was receptive  to today's appointment as evidenced by openness to sharing, responsiveness to feedback, and willingness to explore triggers for emotional eating.  Mental Status Examination:  Appearance: well groomed and appropriate hygiene  Behavior: appropriate to circumstances Mood: euthymic Affect: mood congruent Speech: normal in rate, volume, and tone Eye Contact: appropriate Psychomotor Activity: unable to assess  Gait: unable to assess Thought Process: linear, logical, and goal directed  Thought Content/Perception: no hallucinations, delusions, bizarre thinking or behavior reported or observed and no evidence or endorsement of suicidal and homicidal ideation, plan, and intent Orientation: time, person, place, and purpose of appointment Memory/Concentration: memory, attention, language, and fund of knowledge intact  Insight/Judgment: fair  Interventions:  Conducted a brief chart review Provided empathic reflections and validation Provided positive reinforcement Employed supportive psychotherapy interventions to facilitate reduced distress and to improve coping skills with identified stressors Psychoeducation provided regarding triggers for emotional eating  DSM-5 Diagnosis(es): 311 (F32.8) Other Specified Depressive Disorder, Emotional Eating Behaviors  Treatment Goal & Progress: During the initial appointment with this provider, the following treatment goal was established: increase coping skills. [name] has demonstrated some progress in her goal as evidenced by increased awareness of hunger patterns.   Plan: The next appointment will be scheduled in approximately three weeks, which will be via MyChart Video Visit. The next session will focus on working towards the established treatment goal.

## 2021-01-13 NOTE — Progress Notes (Signed)
Remote ICD transmission.   

## 2021-01-16 ENCOUNTER — Ambulatory Visit (INDEPENDENT_AMBULATORY_CARE_PROVIDER_SITE_OTHER): Payer: 59

## 2021-01-16 DIAGNOSIS — I5022 Chronic systolic (congestive) heart failure: Secondary | ICD-10-CM

## 2021-01-16 DIAGNOSIS — Z95810 Presence of automatic (implantable) cardiac defibrillator: Secondary | ICD-10-CM

## 2021-01-16 NOTE — Progress Notes (Signed)
Chief Complaint:   OBESITY Monica Solis is here to discuss her progress with her obesity treatment plan along with follow-up of her obesity related diagnoses. [name] is on the Category 3 Plan and states she is following her eating plan approximately 75% of the time. [name] states she is doing 0 minutes 0 times per week.  Today's visit was #: 2 Starting weight: 330 lbs Starting date: 12/27/2020 Today's weight: 323 lbs Today's date: 01/10/2021 Total lbs lost to date: 7 Total lbs lost since last in-office visit: 7  Interim History: Monica Solis did well with weight loss, but she struggled to follow her plan closely. She could not eat all of the fruit due to either diverticulitis or her lack of dentition. She didn't eat the light yogurts due to her desire to avoid artificial sweeteners.  Subjective:   1. Type 2 diabetes mellitus with other specified complication, with long-term current use of insulin (HCC) [name]'s fasting BGs mostly range between 140's, and 2 hour post prandial ranges between 140 and 200. She denies signs of hypoglycemia. I discussed labs with the patient today.  2. Hyperlipidemia associated with type 2 diabetes mellitus ([name]) [name] is unable to tolerate statins. She is working on diet and exercise to help improve her cholesterol. I discussed labs with the patient today.  3. Vitamin D deficiency [name] is on OTC Vit D, but her level is not yet at goal. She notes fatigue. I discussed labs with the patient today.  4. Elevated hemoglobin (HCC) Monica Solis has a history of obstructive sleep apnea, uncontrolled. She has chronically elevated hemoglobin which is likely a result of this. She has factor V Leiden deficiency and a history of blood clots. I discussed labs with the patient today.  5. At risk for heart disease Curstin is at a higher than average risk for cardiovascular disease due to obesity.   Assessment/Plan:   1. Type 2 diabetes mellitus with other specified  complication, with long-term current use of insulin (HCC) Good blood sugar control is important to decrease the likelihood of diabetic complications such as nephropathy, neuropathy, limb loss, blindness, coronary artery disease, and death. Intensive lifestyle modification including diet, exercise and weight loss are the first line of treatment for diabetes. Zhaniya is to continue to check her BGs and bring in the log, and will monitor and watch for hypoglycemia with continued weight loss.   2. Hyperlipidemia associated with type 2 diabetes mellitus ([place]) Cardiovascular risk and specific lipid/LDL goals reviewed.  We discussed several lifestyle modifications today. We will recheck labs in 3 months. Tyliah will continue to work on diet, exercise and weight loss efforts. Orders and follow up as documented in patient record.   3. Vitamin D deficiency Low Vitamin D level contributes to fatigue and are associated with obesity, breast, and colon cancer. [name] agreed to continue OTC Vit D, and start prescription Vitamin D 50,000 IU every week with no refills. She will follow-up for routine testing of Vitamin D, at least 2-3 times per year to avoid over-replacement.  - Vitamin D, Ergocalciferol, (DRISDOL) 1.25 MG (50000 UNIT) CAPS capsule; Take 1 capsule (50,000 Units total) by mouth every 7 (seven) days.  Dispense: 4 capsule; Refill: 0  4. Elevated hemoglobin ([place]) Monica Solis will continue Xarelto and will continue to work on weight loss. We will recheck labs in 3 months.  5. At risk for heart disease Monica Solis was given approximately 30 minutes of coronary artery disease prevention counseling today. She is 60 y.o. female and  has risk factors for heart disease including obesity. We discussed intensive lifestyle modifications today with an emphasis on specific weight loss instructions and strategies.   Repetitive spaced learning was employed today to elicit superior memory formation and behavioral  change.  6. Obesity with current BMI of 49.2 Monica Solis is currently in the action stage of change. As such, her goal is to continue with weight loss efforts. She has agreed to change to keeping a food journal and adhering to recommended goals of 1400-1800 calories and 85+ grams of protein daily.   Behavioral modification strategies: increasing lean protein intake.  [name] has agreed to follow-up with our clinic in 2 weeks. She was informed of the importance of frequent follow-up visits to maximize her success with intensive lifestyle modifications for her multiple health conditions.   Objective:   Blood pressure 113/75, pulse 79, temperature 98.2 F (36.8 C), height 5\' 8"  (1.727 m), weight (!) 323 lb (146.5 kg), last menstrual period 08/07/2016, SpO2 96 %. Body mass index is 49.11 kg/m.  General: Cooperative, alert, well developed, in no acute distress. HEENT: Conjunctivae and lids unremarkable. Cardiovascular: Regular rhythm.  Lungs: Normal work of breathing. Neurologic: No focal deficits.   Lab Results  Component Value Date   CREATININE 0.85 12/27/2020   BUN 16 12/27/2020   NA 141 12/27/2020   K 4.5 12/27/2020   CL 100 12/27/2020   CO2 23 12/27/2020   Lab Results  Component Value Date   ALT 24 12/27/2020   AST 20 12/27/2020   ALKPHOS 87 12/27/2020   BILITOT 0.9 12/27/2020   Lab Results  Component Value Date   HGBA1C 6.8 (H) 12/27/2020   Lab Results  Component Value Date   INSULIN 9.1 12/27/2020   Lab Results  Component Value Date   TSH 3.180 12/27/2020   Lab Results  Component Value Date   CHOL 233 (H) 12/27/2020   HDL 47 12/27/2020   LDLCALC 161 (H) 12/27/2020   TRIG 140 12/27/2020   Lab Results  Component Value Date   WBC 5.8 12/27/2020   HGB 16.4 (H) 12/27/2020   HCT 49.1 (H) 12/27/2020   MCV 90 12/27/2020   PLT 259 12/27/2020   No results found for: IRON, TIBC, FERRITIN  Attestation Statements:   Reviewed by clinician on day of visit:  allergies, medications, problem list, medical history, surgical history, family history, social history, and previous encounter notes.   I, Trixie Dredge, am acting as transcriptionist for Dennard Nip, MD.  I have reviewed the above documentation for accuracy and completeness, and I agree with the above. -  Dennard Nip, MD

## 2021-01-18 NOTE — Progress Notes (Signed)
EPIC Encounter for ICM Monitoring  Patient Name: Monica Solis is a 60 y.o. female Date: 01/18/2021 Primary Care Physican: Reynold Bowen, MD Primary Cardiologist:Berry Electrophysiologist:Taylor 12/16/2020 Weight: 330-336lbs  Spoke with patient and reports feeling well at this time.  Denies fluid symptoms.    4/3/2022HeartLogicHFIndex is1suggesting normal fluid levels andwithin normal threshold range.  Prescribed:Hydrochlorothiazide12.5mg Take 12.5 mg by mouth daily as needed (fluid).  Labs: 07/23/2019 Creatinine 0.72, BUN 14, Potassium 4.5, Sodium 141, GFR 93-108  Recommendations:No changes and encouraged to call if experiencing any fluid symptoms.  Follow-up plan: ICM clinic phone appointment on5/06/2021. 91 day device clinic remote transmission6/22/2022.   EP/Cardiology next office visit: Recall10/18/2022with Dr Lovena Le. Recall 2/24/2023with Dr.Berry  Copy of ICM check sent to Dr. Lovena Le.  3 Month Trend    8 Day Data Trend          [name], RN 01/18/2021 1:58 PM

## 2021-01-26 ENCOUNTER — Telehealth (INDEPENDENT_AMBULATORY_CARE_PROVIDER_SITE_OTHER): Payer: 59 | Admitting: Psychology

## 2021-01-26 ENCOUNTER — Ambulatory Visit (INDEPENDENT_AMBULATORY_CARE_PROVIDER_SITE_OTHER): Payer: 59 | Admitting: Family Medicine

## 2021-01-26 ENCOUNTER — Other Ambulatory Visit: Payer: Self-pay

## 2021-01-26 ENCOUNTER — Encounter (INDEPENDENT_AMBULATORY_CARE_PROVIDER_SITE_OTHER): Payer: Self-pay | Admitting: Family Medicine

## 2021-01-26 VITALS — BP 100/66 | HR 73 | Temp 97.0°F | Ht 68.0 in | Wt 324.0 lb

## 2021-01-26 DIAGNOSIS — E559 Vitamin D deficiency, unspecified: Secondary | ICD-10-CM | POA: Diagnosis not present

## 2021-01-26 DIAGNOSIS — F3289 Other specified depressive episodes: Secondary | ICD-10-CM

## 2021-01-26 DIAGNOSIS — E1169 Type 2 diabetes mellitus with other specified complication: Secondary | ICD-10-CM

## 2021-01-26 DIAGNOSIS — Z6841 Body Mass Index (BMI) 40.0 and over, adult: Secondary | ICD-10-CM

## 2021-01-26 DIAGNOSIS — Z794 Long term (current) use of insulin: Secondary | ICD-10-CM

## 2021-01-26 DIAGNOSIS — Z9189 Other specified personal risk factors, not elsewhere classified: Secondary | ICD-10-CM | POA: Diagnosis not present

## 2021-01-26 MED ORDER — VITAMIN D (ERGOCALCIFEROL) 1.25 MG (50000 UNIT) PO CAPS: 50000.0000 [IU] | ORAL_CAPSULE | ORAL | 0 refills | Status: DC

## 2021-02-01 NOTE — Progress Notes (Signed)
Office: [phone]  /  Fax: [phone]    Date: Feb 15, 2021   Appointment Start Time: 1:58pm Duration: 34 minutes Provider: Glennie Isle, Psy.D. Type of Session: Individual Therapy  Location of Patient: Home Location of Provider: Provider's Home (private office) Type of Contact: Telepsychological Visit via MyChart Video Visit  Session Content: Monica Solis is a 60 y.o. female presenting for a follow-up appointment to address the previously established treatment goal of increasing coping skills. Today's appointment was a telepsychological visit due to COVID-19. Monica Solis provided verbal consent for today's telepsychological appointment and she is aware she is responsible for securing confidentiality on her end of the session. Prior to proceeding with today's appointment, Monica Solis's physical location at the time of this appointment was obtained as well a phone number she could be reached at in the event of technical difficulties. Monica Solis and this provider participated in today's telepsychological service.   This provider conducted a brief check-in. Monica Solis shared she is "still struggling" with weight loss. She discussed weighing herself daily at home due to CHF. This provider recommended she continue to weigh as asked by her doctor for CHF, but discussed the consequences of looking at the number on the scale regularly for weight loss (i.e., it may skew eating habits for the day). She acknowledged understanding. Monica Solis further shared challenges with eating out, as she feels she is unable to find "good options." Strategies for making better choices and engaging in portion control were discussed. Monica Solis also acknowledged that when she notices weight gain, she may eat less that day. Thus, the importance of eating regularly and meeting calorie/protein goals was discussed, specifically as it relates to metabolism. She acknowledged that her sodium intake limit makes it hard to meet calorie and protein goals;  therefore, she was encouraged to speak with Dr. Leafy Ro about it. Notably, she reported a reduction in emotional eating. However, expressed concern about the impact of ongoing stressors. Thus, she was encouraged to be mindful of the impact of the stressors on her eating habits in the coming weeks; she agreed. Monica Solis was receptive to today's appointment as evidenced by openness to sharing, responsiveness to feedback, and willingness to implement discussed strategies .  Mental Status Examination:  Appearance: well groomed and appropriate hygiene  Behavior: appropriate to circumstances Mood: euthymic Affect: mood congruent Speech: normal in rate, volume, and tone Eye Contact: intermittent Psychomotor Activity: unable to assess  Gait: unable to assess Thought Process: linear, logical, and goal directed  Thought Content/Perception: no hallucinations, delusions, bizarre thinking or behavior reported or observed and no evidence or endorsement of suicidal and homicidal ideation, plan, and intent Orientation: time, person, place, and purpose of appointment Memory/Concentration: memory, attention, language, and fund of knowledge intact  Insight/Judgment: fair  Interventions:  Conducted a brief chart review Provided empathic reflections and validation Employed supportive psychotherapy interventions to facilitate reduced distress and to improve coping skills with identified stressors  Discussed strategies for eating out  DSM-5 Diagnosis(es): F32.89 Other Specified Depressive Disorder, Emotional Eating Behaviors  Treatment Goal & Progress: During the initial appointment with this provider, the following treatment goal was established: increase coping skills. Monica Solis has demonstrated progress in her goal as evidenced by increased awareness of hunger patterns and increased awareness of triggers for emotional eating. Pamalee also continues to demonstrate willingness to engage in learned skill(s).  Plan:  The next appointment will be scheduled in approximately three weeks, which will be via MyChart Video Visit. The next session will focus on working towards the established treatment  goal.

## 2021-02-02 NOTE — Progress Notes (Signed)
Chief Complaint:   OBESITY Monica Solis is here to discuss her progress with her obesity treatment plan along with follow-up of her obesity related diagnoses. Monica Solis is on keeping a food journal and adhering to recommended goals of 1400-1800 calories and 85+ grams of protein daily and states she is following her eating plan approximately 0% of the time. Monica Solis states she is doing 0 minutes 0 times per week.  Today's visit was #: 3 Starting weight: 330 lbs Starting date: 12/27/2020 Today's weight: 324 lbs Today's date: 01/26/2021 Total lbs lost to date: 6 Total lbs lost since last in-office visit: 0  Interim History: Monica Solis is working on journaling and trying to meet her protein, calorie, and sodium goals. She is up a little but this appears to be due to fluid. Her hunger is controlled. She is looking for appropriate snack options.  Subjective:   1. Vitamin D deficiency Mylena is stable on Vit D, and she denies nausea, vomiting, or muscle weakness.  2. Type 2 diabetes mellitus with other specified complication, with long-term current use of insulin (HCC) Bellamarie's fasting BGs mostly ranges between 130 and 140's, and 2 hour post prandial mostly ranges between 130 and 160's. She is self adjusting her insulin per her Endocrinologist, Dr. Forde Dandy. She denies signs of hypoglycemia.  3. At high risk for fluid overload Shwanda is at a higher than average risk for fluid retention due to congestive heart failure.  Assessment/Plan:   1. Vitamin D deficiency Low Vitamin D level contributes to fatigue and are associated with obesity, breast, and colon cancer. We will refill prescription Vitamin D for 90 days with no refills. Monica Solis will follow-up for routine testing of Vitamin D, at least 2-3 times per year to avoid over-replacement.  - Vitamin D, Ergocalciferol, (DRISDOL) 1.25 MG (50000 UNIT) CAPS capsule; Take 1 capsule (50,000 Units total) by mouth every 7 (seven) days.  Dispense: 12  capsule; Refill: 0  2. Type 2 diabetes mellitus with other specified complication, with long-term current use of insulin (HCC) Good blood sugar control is important to decrease the likelihood of diabetic complications such as nephropathy, neuropathy, limb loss, blindness, coronary artery disease, and death. Intensive lifestyle modification including diet, exercise and weight loss are the first line of treatment for diabetes. Monica Solis will continue to work on decreasing simple carbohydrates, and continue weight loss. Will continue to monitor.   3. At high risk for fluid overload Monica Solis was given approximately 15 minutes of fluid retention prevention counseling today. She is 60 y.o. female and has risk factors for fluid retention including obesity. We discussed intensive lifestyle modifications today with an emphasis on specific weight loss instructions, proper nutrition and exercise strategies.   Repetitive spaced learning was employed today to elicit superior memory formation and behavioral change.  4. Obsity with current BMI of 49.4 Monica Solis is currently in the action stage of change. As such, her goal is to continue with weight loss efforts. She has agreed to keeping a food journal and adhering to recommended goals of 1400-1800 calories and 85+ grams of protein daily and keep Na+ below 1500.   100 calorie snacks list was given today.  Behavioral modification strategies: increasing lean protein intake and celebration eating strategies.  Monica Solis has agreed to follow-up with our clinic in 3 weeks. She was informed of the importance of frequent follow-up visits to maximize her success with intensive lifestyle modifications for her multiple health conditions.   Objective:   Blood pressure 100/66, pulse 73,  temperature (!) 97 F (36.1 C), height 5\' 8"  (1.727 m), weight (!) 324 lb (147 kg), last menstrual period 08/07/2016, SpO2 98 %. Body mass index is 49.26 kg/m.  General: Cooperative, alert,  well developed, in no acute distress. HEENT: Conjunctivae and lids unremarkable. Cardiovascular: Regular rhythm.  Lungs: Normal work of breathing. Neurologic: No focal deficits.   Lab Results  Component Value Date   CREATININE 0.85 12/27/2020   BUN 16 12/27/2020   NA 141 12/27/2020   K 4.5 12/27/2020   CL 100 12/27/2020   CO2 23 12/27/2020   Lab Results  Component Value Date   ALT 24 12/27/2020   AST 20 12/27/2020   ALKPHOS 87 12/27/2020   BILITOT 0.9 12/27/2020   Lab Results  Component Value Date   HGBA1C 6.8 (H) 12/27/2020   Lab Results  Component Value Date   INSULIN 9.1 12/27/2020   Lab Results  Component Value Date   TSH 3.180 12/27/2020   Lab Results  Component Value Date   CHOL 233 (H) 12/27/2020   HDL 47 12/27/2020   LDLCALC 161 (H) 12/27/2020   TRIG 140 12/27/2020   Lab Results  Component Value Date   WBC 5.8 12/27/2020   HGB 16.4 (H) 12/27/2020   HCT 49.1 (H) 12/27/2020   MCV 90 12/27/2020   PLT 259 12/27/2020   No results found for: IRON, TIBC, FERRITIN   Attestation Statements:   Reviewed by clinician on day of visit: allergies, medications, problem list, medical history, surgical history, family history, social history, and previous encounter notes.   I, Trixie Dredge, am acting as transcriptionist for Dennard Nip, MD.  I have reviewed the above documentation for accuracy and completeness, and I agree with the above. -  Dennard Nip, MD

## 2021-02-15 ENCOUNTER — Telehealth (INDEPENDENT_AMBULATORY_CARE_PROVIDER_SITE_OTHER): Payer: Self-pay | Admitting: Psychology

## 2021-02-15 ENCOUNTER — Telehealth (INDEPENDENT_AMBULATORY_CARE_PROVIDER_SITE_OTHER): Payer: 59 | Admitting: Psychology

## 2021-02-15 DIAGNOSIS — F3289 Other specified depressive episodes: Secondary | ICD-10-CM | POA: Diagnosis not present

## 2021-02-15 NOTE — Telephone Encounter (Signed)
  Office: [phone]  /  Fax: [phone]  Date of Call: Feb 15, 2021  Time of Call: 4:53pm Duration of Call: ~2 minutes Provider: Glennie Isle, PsyD  CONTENT: This provider called Monica Solis to schedule a follow-up appointment as Epic went down at the time of today's appointment and a follow-up appointment could not be scheduled. She was receptive to scheduling. No evidence or endorsement of any safety concerns.   PLAN: Monica Solis is scheduled for an appointment on Mar 07, 2021 at 10:00am via [place] Visit.

## 2021-02-20 ENCOUNTER — Other Ambulatory Visit: Payer: Self-pay

## 2021-02-20 ENCOUNTER — Ambulatory Visit (INDEPENDENT_AMBULATORY_CARE_PROVIDER_SITE_OTHER): Payer: 59 | Admitting: Family Medicine

## 2021-02-20 ENCOUNTER — Ambulatory Visit (INDEPENDENT_AMBULATORY_CARE_PROVIDER_SITE_OTHER): Payer: 59

## 2021-02-20 ENCOUNTER — Encounter (INDEPENDENT_AMBULATORY_CARE_PROVIDER_SITE_OTHER): Payer: Self-pay | Admitting: Family Medicine

## 2021-02-20 VITALS — BP 113/72 | HR 72 | Temp 98.3°F

## 2021-02-20 DIAGNOSIS — I10 Essential (primary) hypertension: Secondary | ICD-10-CM | POA: Diagnosis not present

## 2021-02-20 DIAGNOSIS — I5022 Chronic systolic (congestive) heart failure: Secondary | ICD-10-CM

## 2021-02-20 DIAGNOSIS — Z794 Long term (current) use of insulin: Secondary | ICD-10-CM | POA: Diagnosis not present

## 2021-02-20 DIAGNOSIS — Z95810 Presence of automatic (implantable) cardiac defibrillator: Secondary | ICD-10-CM

## 2021-02-20 DIAGNOSIS — Z6841 Body Mass Index (BMI) 40.0 and over, adult: Secondary | ICD-10-CM

## 2021-02-20 DIAGNOSIS — E1169 Type 2 diabetes mellitus with other specified complication: Secondary | ICD-10-CM | POA: Diagnosis not present

## 2021-02-21 MED ORDER — VICTOZA 18 MG/3ML ~~LOC~~ SOPN: 0.6000 mg | PEN_INJECTOR | Freq: Every day | SUBCUTANEOUS | 0 refills | Status: DC

## 2021-02-21 NOTE — Progress Notes (Signed)
  Office: [phone]  /  Fax: [phone]    Date: Mar 07, 2021   Appointment Start Time: 10:00am Duration: 27 minutes Provider: Glennie Isle, Psy.D. Type of Session: Individual Therapy  Location of Patient: Home Location of Provider: Provider's home (private office) Type of Contact: Telepsychological Visit via MyChart Video Visit  Session Content: Monica Solis is a 60 y.o. female presenting for a follow-up appointment to address the previously established treatment goal of increasing coping skills. Today's appointment was a telepsychological visit due to COVID-19. [name] provided verbal consent for today's telepsychological appointment and she is aware she is responsible for securing confidentiality on her end of the session. Prior to proceeding with today's appointment, [name]'s physical location at the time of this appointment was obtained as well a phone number she could be reached at in the event of technical difficulties. [name] and this provider participated in today's telepsychological service.   This provider conducted a brief check-in. [name] shared she was prescribed Victoza; however, she discontinued use due to side effects. She was encouraged to inform Dr. Leafy Ro regarding her ceasing use; she agreed. She further shared she is not losing weight, adding she is feeling "aggravated." This was further explored and processed. It was reflected she is not eating enough nor is she reaching her protein goal, which can impact metabolism. Further psychoeducation regarding the aforementioned was provided. To assist with eating enough more regularly, psychoeducation regarding SMART goals was provided and [name] was engaged in goal setting. The following goal was established: [name] will reach her calorie/protein goals at least 3 out of 7 days a week between now and the next appointment with this provider. Overall, [name] was receptive to today's appointment as evidenced by openness to sharing,  responsiveness to feedback, and willingness to work toward the established SMART goal.  Mental Status Examination:  Appearance: well groomed and appropriate hygiene  Behavior: appropriate to circumstances Mood: euthymic Affect: mood congruent Speech: normal in rate, volume, and tone Eye Contact: appropriate Psychomotor Activity: unable to assess  Gait: unable to assess Thought Process: linear, logical, and goal directed  Thought Content/Perception: no hallucinations, delusions, bizarre thinking or behavior reported or observed and no evidence or endorsement of suicidal and homicidal ideation, plan, and intent Orientation: time, person, place, and purpose of appointment Memory/Concentration: memory, attention, language, and fund of knowledge intact  Insight/Judgment: fair  Interventions:  Conducted a brief chart review Provided empathic reflections and validation Employed supportive psychotherapy interventions to facilitate reduced distress and to improve coping skills with identified stressors Engaged patient in goal setting Psychoeducation provided regarding SMART goals  DSM-5 Diagnosis(es): F32.89 Other Specified Depressive Disorder, Emotional Eating Behaviors  Treatment Goal & Progress: During the initial appointment with this provider, the following treatment goal was established: increase coping skills. [name] has demonstrated progress in her goal as evidenced by increased awareness of hunger patterns and increased awareness of triggers for emotional eating. [name] also continues to demonstrate willingness to engage in learned skill(s).  Plan: The next appointment will be scheduled in three weeks, which will be via MyChart Video Visit. The next session will focus on working towards the established treatment goal. Additionally, she is scheduled to meet with Dr. Leafy Ro on March 20, 2021.

## 2021-02-21 NOTE — Progress Notes (Signed)
EPIC Encounter for ICM Monitoring  Patient Name: Monica Solis is a 60 y.o. female Date: 02/21/2021 Primary Care Physican: Monica Bowen, MD Primary Cardiologist:Monica Solis Electrophysiologist:Monica Solis 5/10/2022Weight: [phone]  Spoke with patient and reports feeling well at this time.  Denies fluid symptoms.   She is working with the physician to lose weight.   5/8/2022HeartLogicHFIndex is4 suggesting normal fluid levels andwithin normal threshold range.  Prescribed:Hydrochlorothiazide12.5mg Take 12.5 mg by mouth daily as needed (fluid).  Labs: 12/27/2020 Creatinine 0.85, BUN 13, Potassium 4.5, Sodium 141, GFR 79 A complete set of results can be found in Results Review.  Recommendations: No changes and encouraged to call if experiencing any fluid symptoms.  Follow-up plan: ICM clinic phone appointment on6/20/2022. 91 day device clinic remote transmission6/22/2022.   EP/Cardiology next office visit: Recall10/18/2022with Dr Monica Solis. Recall2/24/2023with Monica Solis  Copy of ICM check sent to Dr. Lovena Solis.  3 Month Trend    8 Day Data Trend          Monica Billings, RN 02/21/2021 7:32 AM

## 2021-02-23 NOTE — Progress Notes (Signed)
Chief Complaint:   OBESITY Monica Solis is here to discuss her progress with her obesity treatment plan along with follow-up of her obesity related diagnoses. Monica Solis is on keeping a food journal and adhering to recommended goals of 1400-1800 calories and 85+ grams of protein daily and states she is following her eating plan approximately 100% of the time. Monica Solis states she is doing 0 minutes 0 times per week.  Today's visit was #: 4 Starting weight: 330 lbs Starting date: 12/27/2020 Today's weight: 324 lbs Today's date: 02/20/2021 Total lbs lost to date: 6 Total lbs lost since last in-office visit: 0  Interim History: Monica Solis has had fluctuating weights at home. She has tried to watch her sodium intake and she has had a lot of stress.  Subjective:   1. Type 2 diabetes mellitus with other specified complication, with long-term current use of insulin (HCC) Monica Solis is working on diet and exercise, but continues to struggle with weight loss. She was on Victoza in the past and felt it made her lightheaded, but in retrospect she thinks this may not have been due to Victoza.  2. Essential hypertension Monica Solis's blood pressure is stable on Entresto, and she denies lightheadedness.  Assessment/Plan:   1. Type 2 diabetes mellitus with other specified complication, with long-term current use of insulin (HCC) Monica Solis will continue insulin as is, an she agreed to start 0.6 mg of Victoza q daily with no refills; and she will continue to monitor her blood sugars. Good blood sugar control is important to decrease the likelihood of diabetic complications such as nephropathy, neuropathy, limb loss, blindness, coronary artery disease, and death. Intensive lifestyle modification including diet, exercise and weight loss are the first line of treatment for diabetes.   - liraglutide (VICTOZA) 18 MG/3ML SOPN; Inject 0.6 mg into the skin daily.  Dispense: 3 mL; Refill: 0  2. Essential hypertension Monica Solis  will continue diet and exercise, and decrease sodium to improve blood pressure control. We will watch for signs of hypotension as she continues her lifestyle modifications.  3. Obesity with current BMI 49.3 Monica Solis is currently in the action stage of change. As such, her goal is to continue with weight loss efforts. She has agreed to keeping a food journal and adhering to recommended goals of 1400-1800 calories and 85 grams of protein daily.   Behavioral modification strategies: decreasing sodium intake.  Monica Solis has agreed to follow-up with our clinic in 4 weeks. She was informed of the importance of frequent follow-up visits to maximize her success with intensive lifestyle modifications for her multiple health conditions.   Objective:   Blood pressure 113/72, pulse 72, temperature 98.3 F (36.8 C), height (P) 5\' 8"  (1.727 m), weight (!) (P) 324 lb (147 kg), last menstrual period 08/07/2016, SpO2 97 %. Body mass index is 49.26 kg/m (pended).  General: Cooperative, alert, well developed, in no acute distress. HEENT: Conjunctivae and lids unremarkable. Cardiovascular: Regular rhythm.  Lungs: Normal work of breathing. Neurologic: No focal deficits.   Lab Results  Component Value Date   CREATININE 0.85 12/27/2020   BUN 16 12/27/2020   NA 141 12/27/2020   K 4.5 12/27/2020   CL 100 12/27/2020   CO2 23 12/27/2020   Lab Results  Component Value Date   ALT 24 12/27/2020   AST 20 12/27/2020   ALKPHOS 87 12/27/2020   BILITOT 0.9 12/27/2020   Lab Results  Component Value Date   HGBA1C 6.8 (H) 12/27/2020   Lab Results  Component  Value Date   INSULIN 9.1 12/27/2020   Lab Results  Component Value Date   TSH 3.180 12/27/2020   Lab Results  Component Value Date   CHOL 233 (H) 12/27/2020   HDL 47 12/27/2020   LDLCALC 161 (H) 12/27/2020   TRIG 140 12/27/2020   Lab Results  Component Value Date   WBC 5.8 12/27/2020   HGB 16.4 (H) 12/27/2020   HCT 49.1 (H) 12/27/2020   MCV  90 12/27/2020   PLT 259 12/27/2020   No results found for: IRON, TIBC, FERRITIN  Attestation Statements:   Reviewed by clinician on day of visit: allergies, medications, problem list, medical history, surgical history, family history, social history, and previous encounter notes.   I, Trixie Dredge, am acting as transcriptionist for Dennard Nip, MD.  I have reviewed the above documentation for accuracy and completeness, and I agree with the above. -  Dennard Nip, MD

## 2021-03-07 ENCOUNTER — Telehealth (INDEPENDENT_AMBULATORY_CARE_PROVIDER_SITE_OTHER): Payer: 59 | Admitting: Psychology

## 2021-03-07 DIAGNOSIS — F3289 Other specified depressive episodes: Secondary | ICD-10-CM | POA: Diagnosis not present

## 2021-03-14 NOTE — Progress Notes (Signed)
  Office: [phone]  /  Fax: [phone]    Date: March 28, 2021   Appointment Start Time: 3:00pm Duration: 32 minutes Provider: Glennie Isle, Psy.D. Type of Session: Individual Therapy  Location of Patient: Home Location of Provider: Provider's home (private office) Type of Contact: Telepsychological Visit via MyChart Video Visit  Session Content: Monica Solis is a 60 y.o. female presenting for a follow-up appointment to address the previously established treatment goal of increasing coping skills. Today's appointment was a telepsychological visit due to COVID-19. Monica Solis provided verbal consent for today's telepsychological appointment and she is aware she is responsible for securing confidentiality on her end of the session. Prior to proceeding with today's appointment, Monica Solis's physical location at the time of this appointment was obtained as well a phone number she could be reached at in the event of technical difficulties. Keylah and this provider participated in today's telepsychological service.   This provider conducted a brief check-in. Caidance reported, "Things have not been going good for me. I keep gaining weight." She also discussed a diverticulitis flare up. Monica Solis reported reaching her previously established SMART goal with the exception of the week she was not feeling well. Positive reinforcement was provided. Her eating habits were further explored due to continued challenges. It was reflected she is still not eating enough. Monica Solis reported she enjoys eating certain foods that can help her reach her protein and calorie goals, but she expressed concern about sodium intake. Thus, this provider and Monica Solis brainstormed alternative ways to make heart healthy versions of foods she enjoys. This provider also engaged her in problem solving to make food flavorful aside from using salt. Moreover, she was encouraged to consume protein in every meal to ensure she is reaching her protein  goal. Monica Solis was receptive to today's appointment as evidenced by openness to sharing, responsiveness to feedback, and willingness to implement discussed strategies .  Mental Status Examination:  Appearance: well groomed and appropriate hygiene  Behavior: appropriate to circumstances Mood: euthymic Affect: mood congruent Speech: normal in rate, volume, and tone Eye Contact: appropriate Psychomotor Activity: unable to assess  Gait: unable to assess Thought Process: linear, logical, and goal directed  Thought Content/Perception: no hallucinations, delusions, bizarre thinking or behavior reported or observed and no evidence or endorsement of suicidal and homicidal ideation, plan, and intent Orientation: time, person, place, and purpose of appointment Memory/Concentration: memory, attention, language, and fund of knowledge intact  Insight/Judgment: fair  Interventions:  Conducted a brief chart review Provided empathic reflections and validation Provided positive reinforcement Employed supportive psychotherapy interventions to facilitate reduced distress and to improve coping skills with identified stressors Engaged patient in problem solving  DSM-5 Diagnosis(es): F32.89 Other Specified Depressive Disorder, Emotional Eating Behaviors  Treatment Goal & Progress: During the initial appointment with this provider, the following treatment goal was established: increase coping skills. Monica Solis has demonstrated progress in her goal as evidenced by increased awareness of hunger patterns and increased awareness of triggers for emotional eating behaviors. Monica Solis also continues to demonstrate willingness to engage in learned skill(s).  Plan: Monica Solis is scheduled for an appointment with Dr. Leafy Ro on April 20, 2021; therefore, the next appointment with this provider will be scheduled in 3-4 weeks, which will be via MyChart Video Visit. The next session will focus on working towards the established  treatment goal.

## 2021-03-20 ENCOUNTER — Telehealth (INDEPENDENT_AMBULATORY_CARE_PROVIDER_SITE_OTHER): Payer: 59 | Admitting: Family Medicine

## 2021-03-20 ENCOUNTER — Encounter (INDEPENDENT_AMBULATORY_CARE_PROVIDER_SITE_OTHER): Payer: Self-pay | Admitting: Family Medicine

## 2021-03-20 ENCOUNTER — Other Ambulatory Visit: Payer: Self-pay

## 2021-03-20 DIAGNOSIS — Z794 Long term (current) use of insulin: Secondary | ICD-10-CM | POA: Diagnosis not present

## 2021-03-20 DIAGNOSIS — Z6841 Body Mass Index (BMI) 40.0 and over, adult: Secondary | ICD-10-CM

## 2021-03-20 DIAGNOSIS — K5792 Diverticulitis of intestine, part unspecified, without perforation or abscess without bleeding: Secondary | ICD-10-CM

## 2021-03-20 DIAGNOSIS — E1169 Type 2 diabetes mellitus with other specified complication: Secondary | ICD-10-CM

## 2021-03-21 ENCOUNTER — Encounter (INDEPENDENT_AMBULATORY_CARE_PROVIDER_SITE_OTHER): Payer: Self-pay | Admitting: Family Medicine

## 2021-03-28 ENCOUNTER — Telehealth (INDEPENDENT_AMBULATORY_CARE_PROVIDER_SITE_OTHER): Payer: 59 | Admitting: Psychology

## 2021-03-28 DIAGNOSIS — F3289 Other specified depressive episodes: Secondary | ICD-10-CM

## 2021-03-28 NOTE — Progress Notes (Signed)
TeleHealth Visit:  Due to the COVID-19 pandemic, this visit was completed with telemedicine (audio/video) technology to reduce patient and provider exposure as well as to preserve personal protective equipment.   Monica Solis has verbally consented to this TeleHealth visit. The patient is located at home, the provider is located at the [facility] office. The participants in this visit include the listed provider and patient. The visit was conducted today via MyChart video.   Chief Complaint: OBESITY Monica Solis is here to discuss her progress with her obesity treatment plan along with follow-up of her obesity related diagnoses. Monica Solis is on keeping a food journal and adhering to recommended goals of 1400-1800 calories and 85 grams of protein daily and states she is following her eating plan approximately 80-90% of the time. Monica Solis states she is doing 0 minutes 0 times per week.  Today's visit was #: 5 Starting weight: 330 lbs Starting date: 12/27/2020  Interim History: Monica Solis hasn't been able to follow her plan closely due to having more GI issues. She isn't meeting her calorie or protein goals regularly.  Subjective:   1. Type 2 diabetes mellitus with other specified complication, with long-term current use of insulin (HCC) Aadvika continues to work on diet, but she has struggled more recently with her diverticulitis flare.  Assessment/Plan:   1. Type 2 diabetes mellitus with other specified complication, with long-term current use of insulin (HCC) Monica Solis is to continue to decrease simple carbohydrates and will get back on her eating plan when she feels better. She is to increase her water intake. Good blood sugar control is important to decrease the likelihood of diabetic complications such as nephropathy, neuropathy, limb loss, blindness, coronary artery disease, and death. Intensive lifestyle modification including diet, exercise and weight loss are the first line of  treatment for diabetes.   2. Obesity with current BMI 49.2 Monica Solis is currently in the action stage of change. As such, her goal is to continue with weight loss efforts. She has agreed to keeping a food journal and adhering to recommended goals of 1400-1800 calories and 100+ grams of protein daily.   Behavioral modification strategies: increasing water intake and keeping a strict food journal.  Monica Solis has agreed to follow-up with our clinic in 4 weeks. She was informed of the importance of frequent follow-up visits to maximize her success with intensive lifestyle modifications for her multiple health conditions.  Objective:   VITALS: Per patient if applicable, see vitals. GENERAL: Alert and in no acute distress. CARDIOPULMONARY: No increased WOB. Speaking in clear sentences.  PSYCH: Pleasant and cooperative. Speech normal rate and rhythm. Affect is appropriate. Insight and judgement are appropriate. Attention is focused, linear, and appropriate.  NEURO: Oriented as arrived to appointment on time with no prompting.   Lab Results  Component Value Date   CREATININE 0.85 12/27/2020   BUN 16 12/27/2020   NA 141 12/27/2020   K 4.5 12/27/2020   CL 100 12/27/2020   CO2 23 12/27/2020   Lab Results  Component Value Date   ALT 24 12/27/2020   AST 20 12/27/2020   ALKPHOS 87 12/27/2020   BILITOT 0.9 12/27/2020   Lab Results  Component Value Date   HGBA1C 6.8 (H) 12/27/2020   Lab Results  Component Value Date   INSULIN 9.1 12/27/2020   Lab Results  Component Value Date   TSH 3.180 12/27/2020   Lab Results  Component Value Date   CHOL 233 (H) 12/27/2020   HDL 47 12/27/2020  LDLCALC 161 (H) 12/27/2020   TRIG 140 12/27/2020   Lab Results  Component Value Date   WBC 5.8 12/27/2020   HGB 16.4 (H) 12/27/2020   HCT 49.1 (H) 12/27/2020   MCV 90 12/27/2020   PLT 259 12/27/2020   No results found for: IRON, TIBC, FERRITIN  Attestation Statements:   Reviewed by clinician on  day of visit: allergies, medications, problem list, medical history, surgical history, family history, social history, and previous encounter notes.   I, Trixie Dredge, am acting as transcriptionist for Dennard Nip, MD.  I have reviewed the above documentation for accuracy and completeness, and I agree with the above. - Dennard Nip, MD

## 2021-04-03 ENCOUNTER — Ambulatory Visit (INDEPENDENT_AMBULATORY_CARE_PROVIDER_SITE_OTHER): Payer: 59

## 2021-04-03 DIAGNOSIS — I5022 Chronic systolic (congestive) heart failure: Secondary | ICD-10-CM

## 2021-04-03 DIAGNOSIS — Z95810 Presence of automatic (implantable) cardiac defibrillator: Secondary | ICD-10-CM

## 2021-04-05 ENCOUNTER — Ambulatory Visit (INDEPENDENT_AMBULATORY_CARE_PROVIDER_SITE_OTHER): Payer: 59

## 2021-04-05 ENCOUNTER — Telehealth: Payer: Self-pay

## 2021-04-05 DIAGNOSIS — I428 Other cardiomyopathies: Secondary | ICD-10-CM | POA: Diagnosis not present

## 2021-04-05 LAB — CUP PACEART REMOTE DEVICE CHECK
Battery Remaining Longevity: 108 mo
Battery Remaining Percentage: 100 %
Brady Statistic RA Percent Paced: 0 %
Brady Statistic RV Percent Paced: 3 %
Date Time Interrogation Session: 20220622014300
HighPow Impedance: 71 Ohm
Implantable Lead Implant Date: 20180912
Implantable Lead Implant Date: 20180912
Implantable Lead Implant Date: 20180912
Implantable Lead Location: 753858
Implantable Lead Location: 753859
Implantable Lead Location: 753860
Implantable Lead Model: 293
Implantable Lead Model: 4674
Implantable Lead Model: 7741
Implantable Lead Serial Number: 435131
Implantable Lead Serial Number: 813037
Implantable Lead Serial Number: 920623
Implantable Pulse Generator Implant Date: 20180912
Lead Channel Impedance Value: 527 Ohm
Lead Channel Impedance Value: 606 Ohm
Lead Channel Impedance Value: 731 Ohm
Lead Channel Setting Pacing Amplitude: 2 V
Lead Channel Setting Pacing Amplitude: 2.5 V
Lead Channel Setting Pacing Amplitude: 2.5 V
Lead Channel Setting Pacing Pulse Width: 0.4 ms
Lead Channel Setting Pacing Pulse Width: 0.4 ms
Lead Channel Setting Sensing Sensitivity: 0.5 mV
Lead Channel Setting Sensing Sensitivity: 1 mV
Pulse Gen Serial Number: 193993

## 2021-04-05 NOTE — Telephone Encounter (Signed)
Remote ICM transmission received.  Attempted call to patient regarding ICM remote transmission and no answer or voice mail option.  

## 2021-04-05 NOTE — Progress Notes (Signed)
EPIC Encounter for ICM Monitoring  Patient Name: Monica Solis is a 60 y.o. female Date: 04/05/2021 Primary Care Physican: Reynold Bowen, MD Primary Cardiologist: Gwenlyn Found Electrophysiologist: Lovena Le  02/21/2021 Weight: 324 lbs   Attempted call to patient and unable to reach.  Transmission reviewed.    04/04/2021 [facility]Logic HF Index is 0 suggesting normal fluid levels and within normal threshold range.   Prescribed: Hydrochlorothiazide 12.5 mg Take 12.5 mg by mouth daily as needed (fluid).    Labs: 12/27/2020 Creatinine 0.85, BUN 13, Potassium 4.5, Sodium 141, GFR 79 A complete set of results can be found in Results Review.   Recommendations:  Unable to reach.     Follow-up plan: ICM clinic phone appointment on 05/08/2021.   91 day device clinic remote transmission 07/05/2021.              EP/Cardiology next office visit:  Recall 08/01/2021 with Dr Lovena Le.  Recall 12/08/2021 with Dr. Gwenlyn Found          Copy of ICM check sent to Dr. Lovena Le..  3 Month Trend    8 Day Data Trend          Monica Billings, RN 04/05/2021 4:01 PM

## 2021-04-10 ENCOUNTER — Encounter: Payer: 59 | Admitting: Obstetrics and Gynecology

## 2021-04-10 ENCOUNTER — Ambulatory Visit: Payer: 59 | Admitting: Nurse Practitioner

## 2021-04-11 NOTE — Progress Notes (Unsigned)
Office: [phone]  /  Fax: [phone]    Date: April 25, 2021   Appointment Start Time: *** Duration: *** minutes Provider: Glennie Isle, Psy.D. Type of Session: Individual Therapy  Location of Patient: {gbptloc:23249} Location of Provider: Provider's home (private office) Type of Contact: Telepsychological Visit via MyChart Video Visit  Session Content: Monica Solis is a 60 y.o. female presenting for a follow-up appointment to address the previously established treatment goal of increasing coping skills. Today's appointment was a telepsychological visit due to COVID-19. Monica Solis provided verbal consent for today's telepsychological appointment and she is aware she is responsible for securing confidentiality on her end of the session. Prior to proceeding with today's appointment, Monica Solis's physical location at the time of this appointment was obtained as well a phone number she could be reached at in the event of technical difficulties. Monica Solis and this provider participated in today's telepsychological service.   This provider conducted a brief check-in and verbally administered the PHQ-9 and GAD-7. *** Monica Solis was receptive to today's appointment as evidenced by openness to sharing, responsiveness to feedback, and {gbreceptiveness:23401}.  Mental Status Examination:  Appearance: {Appearance:22431} Behavior: {Behavior:22445} Mood: {gbmood:21757} Affect: {Affect:22436} Speech: {Speech:22432} Eye Contact: {Eye Contact:22433} Psychomotor Activity: unable to assess Gait: unable to assess Thought Process: {thought process:22448}  Thought Content/Perception: {disturbances:22451} Orientation: {Orientation:22437} Memory/Concentration: {gbcognition:22449} Insight/Judgment: {Insight:22446}  Structured Assessments Results: The Patient Health Questionnaire-9 (PHQ-9) is a self-report measure that assesses symptoms and severity of depression over the course of the last two weeks. Monica Solis obtained  a score of *** suggesting {GBPHQ9SEVERITY:21752}. Monica Solis finds the endorsed symptoms to be {gbphq9difficulty:21754}. [0= Not at all; 1= Several days; 2= More than half the days; 3= Nearly every day] Little interest or pleasure in doing things ***  Feeling down, depressed, or hopeless ***  Trouble falling or staying asleep, or sleeping too much ***  Feeling tired or having little energy ***  Poor appetite or overeating ***  Feeling bad about yourself --- or that you are a failure or have let yourself or your family down ***  Trouble concentrating on things, such as reading the newspaper or watching television ***  Moving or speaking so slowly that other people could have noticed? Or the opposite --- being so fidgety or restless that you have been moving around a lot more than usual ***  Thoughts that you would be better off dead or hurting yourself in some way ***  PHQ-9 Score ***    The Generalized Anxiety Disorder-7 (GAD-7) is a brief self-report measure that assesses symptoms of anxiety over the course of the last two weeks. Monica Solis obtained a score of *** suggesting {gbgad7severity:21753}. Monica Solis finds the endorsed symptoms to be {gbphq9difficulty:21754}. [0= Not at all; 1= Several days; 2= Over half the days; 3= Nearly every day] Feeling nervous, anxious, on edge ***  Not being able to stop or control worrying ***  Worrying too much about different things ***  Trouble relaxing ***  Being so restless that it's hard to sit still ***  Becoming easily annoyed or irritable ***  Feeling afraid as if something awful might happen ***  GAD-7 Score ***   Interventions:  {Interventions:22172}  DSM-5 Diagnosis(es): F32.89 Other Specified Depressive Disorder, Emotional Eating Behaviors  Treatment Goal & Progress: During the initial appointment with this provider, the following treatment goal was established: increase coping skills. Monica Solis has demonstrated progress in her goal as evidenced by  {gbtxprogress:22839}. Monica Solis also {gbtxprogress2:22951}.  Plan: The next appointment will be scheduled in {gbweeks:21758}, which will  be {gbtxmodality:23402}. The next session will focus on {Plan for Next Appointment:23400}.

## 2021-04-20 ENCOUNTER — Ambulatory Visit (INDEPENDENT_AMBULATORY_CARE_PROVIDER_SITE_OTHER): Payer: Self-pay | Admitting: Family Medicine

## 2021-04-24 ENCOUNTER — Ambulatory Visit: Payer: 59 | Admitting: Nurse Practitioner

## 2021-04-25 ENCOUNTER — Telehealth (INDEPENDENT_AMBULATORY_CARE_PROVIDER_SITE_OTHER): Payer: Self-pay | Admitting: Psychology

## 2021-04-25 NOTE — Progress Notes (Signed)
Remote ICD transmission.   

## 2021-05-08 ENCOUNTER — Ambulatory Visit (INDEPENDENT_AMBULATORY_CARE_PROVIDER_SITE_OTHER): Payer: 59

## 2021-05-08 DIAGNOSIS — Z95810 Presence of automatic (implantable) cardiac defibrillator: Secondary | ICD-10-CM

## 2021-05-08 DIAGNOSIS — I5022 Chronic systolic (congestive) heart failure: Secondary | ICD-10-CM

## 2021-05-10 ENCOUNTER — Telehealth: Payer: Self-pay

## 2021-05-10 NOTE — Telephone Encounter (Signed)
Remote ICM transmission received.  Attempted call to patient regarding ICM remote transmission and left detailed message per DPR.  Advised to return call for any fluid symptoms or questions. Next ICM remote transmission scheduled 06/12/2021.

## 2021-05-10 NOTE — Progress Notes (Signed)
EPIC Encounter for ICM Monitoring  Patient Name: Monica Solis is a 60 y.o. female Date: 05/10/2021 Primary Care Physican: Reynold Bowen, MD Primary Cardiologist: Gwenlyn Found Electrophysiologist: Lovena Le  02/21/2021 Weight: 324 lbs   Attempted call to patient and unable to reach.  Left detailed message per DPR regarding transmission. Transmission reviewed.    05/08/2021 HeartLogic HF Index is 3 suggesting normal fluid levels and within normal threshold range.   Prescribed: Hydrochlorothiazide 12.5 mg Take 12.5 mg by mouth daily as needed (fluid).    Labs: 12/27/2020 Creatinine 0.85, BUN 13, Potassium 4.5, Sodium 141, GFR 79 A complete set of results can be found in Results Review.   Recommendations:  Left voice mail with ICM number and encouraged to call if experiencing any fluid symptoms.   Follow-up plan: ICM clinic phone appointment on 06/12/2021.   91 day device clinic remote transmission 07/05/2021.              EP/Cardiology next office visit:  Recall 08/01/2021 with Dr Lovena Le.  Recall 12/08/2021 with Dr. Gwenlyn Found          Copy of ICM check sent to Dr. Lovena Le.   3 month ICM trend: 05/08/2021.    8 day trend:       [name], RN 05/10/2021 10:58 AM

## 2021-05-23 ENCOUNTER — Telehealth (INDEPENDENT_AMBULATORY_CARE_PROVIDER_SITE_OTHER): Payer: 59 | Admitting: Psychology

## 2021-06-01 ENCOUNTER — Ambulatory Visit: Payer: 59 | Admitting: Nurse Practitioner

## 2021-06-02 HISTORY — PX: CHOLECYSTECTOMY: SHX55

## 2021-06-12 ENCOUNTER — Ambulatory Visit (INDEPENDENT_AMBULATORY_CARE_PROVIDER_SITE_OTHER): Payer: 59

## 2021-06-12 DIAGNOSIS — I5022 Chronic systolic (congestive) heart failure: Secondary | ICD-10-CM

## 2021-06-12 DIAGNOSIS — Z95810 Presence of automatic (implantable) cardiac defibrillator: Secondary | ICD-10-CM | POA: Diagnosis not present

## 2021-06-14 DIAGNOSIS — K8010 Calculus of gallbladder with chronic cholecystitis without obstruction: Secondary | ICD-10-CM | POA: Insufficient documentation

## 2021-06-14 DIAGNOSIS — Z09 Encounter for follow-up examination after completed treatment for conditions other than malignant neoplasm: Secondary | ICD-10-CM | POA: Insufficient documentation

## 2021-06-16 ENCOUNTER — Telehealth: Payer: Self-pay

## 2021-06-16 NOTE — Progress Notes (Addendum)
EPIC Encounter for ICM Monitoring  Patient Name: Monica Solis is a 60 y.o. female Date: 06/16/2021 Primary Care Physican: Reynold Bowen, MD Primary Cardiologist: Gwenlyn Found Electrophysiologist: Lovena Le  06/16/2021 Weight: 325 lbs   Spoke with patient and heart failure questions reviewed.  Pt reported hospitalization resulting in gallbladder surgery 8/16-8/23.  She had 116 gallstones.     06/12/2021 HeartLogic HF Index is 5 suggesting normal fluid levels and within normal threshold range.  Thoracic impedance decreaed impedance 8/19-8/27 which correlates with hospitalization.   Prescribed: Hydrochlorothiazide 12.5 mg Take 12.5 mg by mouth daily as needed (fluid).    Labs: 12/27/2020 Creatinine 0.85, BUN 13, Potassium 4.5, Sodium 141, GFR 79 A complete set of results can be found in Results Review.   Recommendations:  No changes and encouraged to call if experiencing any fluid symptoms.   Follow-up plan: ICM clinic phone appointment on 07/24/2021.   91 day device clinic remote transmission 07/05/2021.              EP/Cardiology next office visit:  Recall 08/01/2021 with Dr Lovena Le.  Recall 12/08/2021 with Dr. Gwenlyn Found          Copy of ICM check sent to Dr. Lovena Le.  3 Month Trend    8 Day Data Trend          [name], RN 06/16/2021 2:18 PM

## 2021-06-16 NOTE — Telephone Encounter (Signed)
Remote ICM transmission received.  Attempted call to patient regarding ICM remote transmission and no answer.  

## 2021-07-05 ENCOUNTER — Ambulatory Visit (INDEPENDENT_AMBULATORY_CARE_PROVIDER_SITE_OTHER): Payer: 59

## 2021-07-05 DIAGNOSIS — I428 Other cardiomyopathies: Secondary | ICD-10-CM

## 2021-07-06 LAB — CUP PACEART REMOTE DEVICE CHECK
Battery Remaining Longevity: 102 mo
Battery Remaining Percentage: 100 %
Brady Statistic RA Percent Paced: 0 %
Brady Statistic RV Percent Paced: 2 %
Date Time Interrogation Session: 20220922105200
HighPow Impedance: 79 Ohm
Implantable Lead Implant Date: 20180912
Implantable Lead Implant Date: 20180912
Implantable Lead Implant Date: 20180912
Implantable Lead Location: 753858
Implantable Lead Location: 753859
Implantable Lead Location: 753860
Implantable Lead Model: 293
Implantable Lead Model: 4674
Implantable Lead Model: 7741
Implantable Lead Serial Number: 435131
Implantable Lead Serial Number: 813037
Implantable Lead Serial Number: 920623
Implantable Pulse Generator Implant Date: 20180912
Lead Channel Impedance Value: 576 Ohm
Lead Channel Impedance Value: 590 Ohm
Lead Channel Impedance Value: 749 Ohm
Lead Channel Setting Pacing Amplitude: 2 V
Lead Channel Setting Pacing Amplitude: 2.5 V
Lead Channel Setting Pacing Amplitude: 2.5 V
Lead Channel Setting Pacing Pulse Width: 0.4 ms
Lead Channel Setting Pacing Pulse Width: 0.4 ms
Lead Channel Setting Sensing Sensitivity: 0.5 mV
Lead Channel Setting Sensing Sensitivity: 1 mV
Pulse Gen Serial Number: 193993

## 2021-07-12 NOTE — Progress Notes (Signed)
Remote ICD transmission.   

## 2021-07-24 ENCOUNTER — Ambulatory Visit (INDEPENDENT_AMBULATORY_CARE_PROVIDER_SITE_OTHER): Payer: 59

## 2021-07-24 DIAGNOSIS — I5022 Chronic systolic (congestive) heart failure: Secondary | ICD-10-CM | POA: Diagnosis not present

## 2021-07-24 DIAGNOSIS — Z95810 Presence of automatic (implantable) cardiac defibrillator: Secondary | ICD-10-CM | POA: Diagnosis not present

## 2021-07-26 NOTE — Progress Notes (Signed)
EPIC Encounter for ICM Monitoring  Patient Name: Monica Solis is a 60 y.o. female Date: 07/26/2021 Primary Care Physican: Reynold Bowen, MD Primary Cardiologist: Gwenlyn Found Electrophysiologist: Lovena Le  06/16/2021 Weight: 325 lbs   Spoke with patient and heart failure questions reviewed.  Pt asymptomatic for fluid accumulation and feeling well.   07/23/2021 HeartLogic HF Index is 6 suggesting fluid levels are within normal threshold range.     Prescribed: Hydrochlorothiazide 12.5 mg Take 12.5 mg by mouth daily as needed (fluid).    Labs: 12/27/2020 Creatinine 0.85, BUN 13, Potassium 4.5, Sodium 141, GFR 79 A complete set of results can be found in Results Review.   Recommendations:  No changes and encouraged to call if experiencing any fluid symptoms.   Follow-up plan: ICM clinic phone appointment on 08/28/2021.   91 day device clinic remote transmission 10/04/2021.              EP/Cardiology next office visit:  08/09/2021 with Dr Lovena Le.  Recall 12/08/2021 with Dr. Gwenlyn Found          Copy of ICM check sent to Dr. Lovena Le.  3 Month Trend    8 Day Data Trend          [name], RN 07/26/2021 8:17 AM

## 2021-08-01 ENCOUNTER — Ambulatory Visit (INDEPENDENT_AMBULATORY_CARE_PROVIDER_SITE_OTHER): Payer: 59 | Admitting: Internal Medicine

## 2021-08-01 ENCOUNTER — Other Ambulatory Visit: Payer: Self-pay

## 2021-08-01 VITALS — BP 140/82 | HR 67 | Ht 69.0 in | Wt 336.2 lb

## 2021-08-01 DIAGNOSIS — I5022 Chronic systolic (congestive) heart failure: Secondary | ICD-10-CM

## 2021-08-01 DIAGNOSIS — Z95810 Presence of automatic (implantable) cardiac defibrillator: Secondary | ICD-10-CM | POA: Diagnosis not present

## 2021-08-01 DIAGNOSIS — I454 Nonspecific intraventricular block: Secondary | ICD-10-CM | POA: Diagnosis not present

## 2021-08-01 NOTE — Progress Notes (Signed)
HPI Monica Solis returns today for ongoing evaluation and management of her ICD, s/p BiV insertion. The patient feels better after her device was placed and has more energy. At times her bp has been a little low. No chest pain. She is frustrated by her inability to lose weight. Allergies  Allergen Reactions   Crestor [Rosuvastatin Calcium] Other (See Comments)    Muscle aches    Nexletol [Bempedoic Acid]    Other Swelling    Metal   Benadryl [Diphenhydramine Hcl] Hives   Canagliflozin Other (See Comments)    Loss of bladder control  Loss of bladder control Dysuria Dysuria   Metformin Other (See Comments)    Kidney damage Kidney damage Kidney damage Decreased kidney function   Pravastatin Other (See Comments) and Rash    Muscle ache Muscle ache Muscle ache   Tramadol Other (See Comments) and Rash    "nightmares" "nightmares" constipation     Current Outpatient Medications  Medication Sig Dispense Refill   carvedilol (COREG) 3.125 MG tablet Take 1 tablet (3.125 mg total) by mouth 2 (two) times daily. 180 tablet 3   esomeprazole (NEXIUM) 40 MG capsule Take by mouth daily.  5   fexofenadine (ALLEGRA) 180 MG tablet Take 180 mg by mouth daily as needed for allergies or rhinitis.     gabapentin (NEURONTIN) 100 MG capsule Take 300 mg by mouth at bedtime. TAKE ONE TABLET IN THE MORNING AND TWO TABLETS AT NIGHT.     hydrochlorothiazide (HYDRODIURIL) 12.5 MG tablet Take 1 tablet (12.5 mg total) by mouth daily as needed. 90 tablet 3   insulin aspart (NOVOLOG) 100 UNIT/ML injection Inject 7-10 Units into the skin 3 (three) times daily as needed for high blood sugar. Per SSC 7-10units when cbg above 120     insulin detemir (LEVEMIR) 100 UNIT/ML injection Inject 40 Units into the skin at bedtime.      liraglutide (VICTOZA) 18 MG/3ML SOPN Inject 0.6 mg into the skin daily. 3 mL 0   Menthol, Topical Analgesic, (BENGAY EX) Apply 1 application topically 2 (two) times daily as needed  (pain).     sacubitril-valsartan (ENTRESTO) 24-26 MG Take 1 tablet by mouth 2 (two) times daily. 180 tablet 3   Vitamin D, Ergocalciferol, (DRISDOL) 1.25 MG (50000 UNIT) CAPS capsule Take 1 capsule (50,000 Units total) by mouth every 7 (seven) days. 12 capsule 0   XARELTO 20 MG TABS tablet Take 1 tablet (20 mg total) by mouth daily. 90 tablet 3   Cholecalciferol (VITAMIN D3) 2000 UNITS capsule Take 4,000 Units by mouth daily with supper.  (Patient not taking: Reported on 08/01/2021)     diclofenac sodium (VOLTAREN) 1 % GEL Apply 2 g topically 4 (four) times daily.  (Patient not taking: Reported on 08/01/2021)     levothyroxine (SYNTHROID) 88 MCG tablet Take 88 mcg by mouth daily.     No current facility-administered medications for this visit.   Facility-Administered Medications Ordered in Other Visits  Medication Dose Route Frequency Provider Last Rate Last Admin   technetium tetrofosmin (TC-MYOVIEW) injection 67.2 millicurie  09.4 millicurie Intravenous Once PRN Hilty, Nadean Corwin, MD         Past Medical History:  Diagnosis Date   Abnormal uterine bleeding (AUB)    AICD (automatic cardioverter/defibrillator) present    At risk for sleep apnea    STOP-BANG=  5       SENT TO PCP 10-01-2016   Bilateral swelling of feet  Blood clot in vein    "superficial blood clots in BLE; I had 6-7 since 2000" (06/26/2017)   BMI 45.0-49.9, adult (HCC)    Chewing difficulty    CHF (congestive heart failure) (HCC)    Chronic bronchitis (HCC)    Constipation    Diverticulosis of colon    Edema    Endometrial polyp    Factor 5 Leiden mutation, heterozygous (Monica Solis)    dx 2011   GERD (gastroesophageal reflux disease)    High cholesterol    History of DVT of lower extremity 2000; 2011   "I've had them in both"   History of ectopic pregnancy 1998   Hyperlipidemia    Hypertension    Hypothyroidism    Left bundle branch block (LBBB)    Non-ischemic cardiomyopathy ([place])    Osteoarthritis    "all  over" (06/26/2017)   Other fatigue    Peripheral neuropathy    PONV (postoperative nausea and vomiting)    Shortness of breath    Shortness of breath on exertion    Sleep apnea    Type 2 diabetes mellitus treated with insulin (HCC)    Vitamin D deficiency     ROS:   All systems reviewed and negative except as noted in the HPI.   Past Surgical History:  Procedure Laterality Date   BI-VENTRICULAR IMPLANTABLE CARDIOVERTER DEFIBRILLATOR  (CRT-D)  06/26/2017   BIV ICD INSERTION CRT-D N/A 06/26/2017   Procedure: BIV ICD INSERTION CRT-D;  Surgeon: Evans Lance, MD;  Location: [facility];  Service: Cardiovascular;  Laterality: N/A;   COLONOSCOPY  2013   DILATATION & CURETTAGE/HYSTEROSCOPY WITH MYOSURE N/A 10/05/2016   Procedure: [place];  Surgeon: Anastasio Auerbach, MD;  Location: [facility];  Service: Gynecology;  Laterality: N/A;  requests to follow 7:30am caserequests one hour OR time   DILATION AND CURETTAGE OF UTERUS  12/06/2007   LAPAROSCOPY BILATERAL TUBAL LIGATION WITH FALOPE RINGS/  D&C HYSTEROSCOPY WITH POLYPECTOMY  12/06/2007   and Lysis Adhesions   LEFT HEART CATH AND CORONARY ANGIOGRAPHY N/A 01/03/2017   Procedure: Left Heart Cath and Coronary Angiography;  Surgeon: Lorretta Harp, MD;  Location: [facility];  Service: Cardiovascular;  Laterality: N/A;   TUBAL LIGATION  12/06/2007     Family History  Problem Relation Age of Onset   Hypertension Mother    Heart disease Mother    Alzheimer's disease Mother    Atrial fibrillation Mother    Diabetes Mother    Hyperlipidemia Mother    Stroke Mother    Anxiety disorder Mother    Sleep apnea Mother    Obesity Mother    Heart disease Father    Sudden death Father    Breast cancer Maternal Aunt        30's   Alzheimer's disease Maternal Aunt    Diabetes Maternal Grandmother    Heart disease Maternal Grandmother    Heart disease Paternal  Grandmother    Cancer Maternal Grandfather        Unknown type   Mitral valve prolapse Sister    Heart disease Paternal Grandfather      Social History   Socioeconomic History   Marital status: Married    Spouse name: Chrissie Noa   Number of children: Not on file   Years of education: Not on file   Highest education level: Not on file  Occupational History   Occupation: home maker  Tobacco Use  Smoking status: Former    Packs/day: 0.10    Years: 2.00    Pack years: 0.20    Types: Cigarettes    Quit date: 10/02/1979    Years since quitting: 41.8   Smokeless tobacco: Never  Vaping Use   Vaping Use: Never used  Substance and Sexual Activity   Alcohol use: No    Alcohol/week: 0.0 standard drinks   Drug use: No   Sexual activity: Not Currently    Partners: Male    Birth control/protection: Surgical    Comment: 1st intercourse 32 yo-1 partner-BTL  Other Topics Concern   Not on file  Social History Narrative   Not on file   Social Determinants of Health   Financial Resource Strain: Not on file  Food Insecurity: Not on file  Transportation Needs: Not on file  Physical Activity: Not on file  Stress: Not on file  Social Connections: Not on file  Intimate Partner Violence: Not on file     BP 140/82   Pulse 67   Ht 5\' 9"  (1.753 m)   Wt (!) 336 lb 3.2 oz (152.5 kg)   LMP 08/07/2016   SpO2 97%   BMI 49.65 kg/m   Physical Exam:  Well appearing NAD HEENT: Unremarkable Neck:  No JVD, no thyromegally Lymphatics:  No adenopathy Back:  No CVA tenderness Lungs:  Clear HEART:  Regular rate rhythm, no murmurs, no rubs, no clicks Abd:  soft, positive bowel sounds, no organomegally, no rebound, no guarding Ext:  2 plus pulses, no edema, no cyanosis, no clubbing Skin:  No rashes no nodules Neuro:  CN II through XII intact, motor grossly intact  EKG  DEVICE  Normal device function.  See PaceArt for details.   Assess/Plan:  1. Chronic systolic heart failure - she  is class 2. She is limited by her mobility. She is encouraged to lose weight and increase her physical activity. No change in meds. 2. ICD - her [place] sci biv device is working normally. We will recheck in several months. 3. Obesity - she is pending a consult with a nutritionist. We will follow. Her weight is down 4 lbs in 3 years.  4. Dizziness - she thinks that this is caused by her entresto. I would recommend she modify her salt intake. She is encouraged to discuss with Dr. Gwenlyn Found, her primary cardiologist.   Lavonia Drafts.D.

## 2021-08-01 NOTE — Patient Instructions (Signed)
Medication Instructions:  Your physician recommends that you continue on your current medications as directed. Please refer to the Current Medication list given to you today.  Labwork: None ordered.  Testing/Procedures: None ordered.  Follow-Up: Your physician wants you to follow-up in: one year with Cristopher Peru, MD or one of the following Advanced Practice Providers on your designated Care Team:   Tommye Standard, [place] Legrand Como "Jonni Sanger" Chalmers Cater, [place]  Remote monitoring is used to monitor your ICD from home. This monitoring reduces the number of office visits required to check your device to one time per year. It allows [place] to keep an eye on the functioning of your device to ensure it is working properly. You are scheduled for a device check from home on 08/28/2021. You may send your transmission at any time that day. If you have a wireless device, the transmission will be sent automatically. After your physician reviews your transmission, you will receive a postcard with your next transmission date.  Any Other Special Instructions Will Be Listed Below (If Applicable).  If you need a refill on your cardiac medications before your next appointment, please call your pharmacy.

## 2021-08-09 ENCOUNTER — Encounter: Payer: Self-pay | Admitting: Internal Medicine

## 2021-08-23 ENCOUNTER — Ambulatory Visit (INDEPENDENT_AMBULATORY_CARE_PROVIDER_SITE_OTHER): Payer: 59 | Admitting: Nurse Practitioner

## 2021-08-23 ENCOUNTER — Encounter: Payer: Self-pay | Admitting: Nurse Practitioner

## 2021-08-23 ENCOUNTER — Other Ambulatory Visit: Payer: Self-pay

## 2021-08-23 VITALS — BP 126/76 | Ht 69.0 in | Wt 333.0 lb

## 2021-08-23 DIAGNOSIS — Z780 Asymptomatic menopausal state: Secondary | ICD-10-CM

## 2021-08-23 DIAGNOSIS — Z01419 Encounter for gynecological examination (general) (routine) without abnormal findings: Secondary | ICD-10-CM | POA: Diagnosis not present

## 2021-08-23 NOTE — Progress Notes (Signed)
   [facility] 11-22-1960 [id number]   History:  60 y.o. G1P0010 presents for annual exam. No GYN complaints. Postmenopausal - No HRT, no bleeding. Normal pap and mammogram history. History of DTV, HTN, CHF, DM, hypothyroidism, factor IV Leiden, HLD.  Gynecologic History Patient's last menstrual period was 08/07/2016.   Contraception/Family planning: post menopausal status and tubal ligation Sexually active: Yes  Health Maintenance Last Pap: 12/23/2018. Results were: Normal, 5-year repeat Last mammogram: 04/19/2020. Results were: Normal Last colonoscopy: 2013. Results were: Normal, 10-year recall Last Dexa: Never  Past medical history, past surgical history, family history and social history were all reviewed and documented in the EPIC chart. Married. Retired Music therapist.   ROS:  A ROS was performed and pertinent positives and negatives are included.  Exam:  Vitals:   08/23/21 1006  BP: 126/76  Weight: (!) 333 lb (151 kg)  Height: 5\' 9"  (1.753 m)   Body mass index is 49.18 kg/m.  General appearance:  Normal Thyroid:  Symmetrical, normal in size, without palpable masses or nodularity. Respiratory  Auscultation:  Clear without wheezing or rhonchi Cardiovascular  Auscultation:  Regular rate, without rubs, murmurs or gallops  Edema/varicosities:  Not grossly evident Abdominal  Soft,nontender, without masses, guarding or rebound.  Liver/spleen:  No organomegaly noted  Hernia:  None appreciated  Skin  Inspection:  Grossly normal Breasts: Examined lying and sitting.   Right: Without masses, retractions, nipple discharge or axillary adenopathy.   Left: Without masses, retractions, nipple discharge or axillary adenopathy. Genitourinary   Inguinal/mons:  Normal without inguinal adenopathy  External genitalia:  Normal appearing vulva with no masses, tenderness, or lesions  BUS/Urethra/Skene's glands:  Normal  Vagina:  Normal appearing with normal color and discharge, no  lesions  Cervix:  Normal appearing without discharge or lesions  Uterus:  Difficult to palpate due to body habitus but no gross masses or tenderness  Adnexa/parametria:     Rt: Normal in size, without masses or tenderness.   Lt: Normal in size, without masses or tenderness.  Anus and perineum: Normal  Digital rectal exam: Normal sphincter tone without palpated masses or tenderness  Patient informed chaperone available to be present for breast and pelvic exam. Patient has requested no chaperone to be present. Patient has been advised what will be completed during breast and pelvic exam.   Assessment/Plan:  60 y.o. G1P0010 for annual exam.   Well female exam with routine gynecological exam - Education provided on SBEs, importance of preventative screenings, current guidelines, high calcium diet, regular exercise, and multivitamin daily.  Labs with PCP.   Postmenopausal - no HRT, no bleeding.   Screening for cervical cancer - Normal Pap history.  Will repeat at 5-year interval per guidelines.  Screening for breast cancer - Normal mammogram history. Overdue due to scheduling issues and plans to schedule this soon. Normal breast exam today.  Screening for colon cancer - 2013 colonoscopy. Will repeat at GI's recommended interval.   Screening for osteoporosis - Recommend DXA at age 35.   Return in 1 year for annual.   Tamela Gammon DNP, 10:46 AM 08/23/2021

## 2021-08-28 ENCOUNTER — Ambulatory Visit (INDEPENDENT_AMBULATORY_CARE_PROVIDER_SITE_OTHER): Payer: 59

## 2021-08-28 DIAGNOSIS — Z95810 Presence of automatic (implantable) cardiac defibrillator: Secondary | ICD-10-CM

## 2021-08-28 DIAGNOSIS — I5022 Chronic systolic (congestive) heart failure: Secondary | ICD-10-CM

## 2021-08-30 NOTE — Progress Notes (Signed)
EPIC Encounter for ICM Monitoring  Patient Name: Tyjanae Bartek is a 60 y.o. female Date: 08/30/2021 Primary Care Physican: Reynold Bowen, MD Primary Cardiologist: Gwenlyn Found Electrophysiologist: Lovena Le  06/16/2021 Weight: 325 lbs   Spoke with patient and heart failure questions reviewed.  Pt asymptomatic for fluid accumulation and feeling well.   HeartLogic HF Index is 6 suggesting fluid levels are within normal threshold range.     Prescribed: Hydrochlorothiazide 12.5 mg Take 12.5 mg by mouth daily as needed (fluid).    Labs: 12/27/2020 Creatinine 0.85, BUN 13, Potassium 4.5, Sodium 141, GFR 79 A complete set of results can be found in Results Review.   Recommendations:  No changes and encouraged to call if experiencing any fluid symptoms.   Follow-up plan: ICM clinic phone appointment on 10/02/2021.   91 day device clinic remote transmission 10/04/2021.              EP/Cardiology next office visit:   Recall 12/08/2021 with Dr. Gwenlyn Found          Copy of ICM check sent to Dr. Lovena Le.  3 Month Trend    8 Day Data Trend          [name], RN 08/30/2021 3:12 PM

## 2021-09-27 ENCOUNTER — Encounter: Payer: Self-pay | Admitting: Nurse Practitioner

## 2021-10-02 ENCOUNTER — Ambulatory Visit (INDEPENDENT_AMBULATORY_CARE_PROVIDER_SITE_OTHER): Payer: 59

## 2021-10-02 DIAGNOSIS — Z95810 Presence of automatic (implantable) cardiac defibrillator: Secondary | ICD-10-CM | POA: Diagnosis not present

## 2021-10-02 DIAGNOSIS — I5022 Chronic systolic (congestive) heart failure: Secondary | ICD-10-CM

## 2021-10-04 ENCOUNTER — Ambulatory Visit (INDEPENDENT_AMBULATORY_CARE_PROVIDER_SITE_OTHER): Payer: 59

## 2021-10-04 DIAGNOSIS — I428 Other cardiomyopathies: Secondary | ICD-10-CM | POA: Diagnosis not present

## 2021-10-04 LAB — CUP PACEART REMOTE DEVICE CHECK
Battery Remaining Longevity: 102 mo
Battery Remaining Percentage: 100 %
Brady Statistic RA Percent Paced: 0 %
Brady Statistic RV Percent Paced: 1 %
Date Time Interrogation Session: 20221219044400
HighPow Impedance: 77 Ohm
Implantable Lead Implant Date: 20180912
Implantable Lead Implant Date: 20180912
Implantable Lead Implant Date: 20180912
Implantable Lead Location: 753858
Implantable Lead Location: 753859
Implantable Lead Location: 753860
Implantable Lead Model: 293
Implantable Lead Model: 4674
Implantable Lead Model: 7741
Implantable Lead Serial Number: 435131
Implantable Lead Serial Number: 813037
Implantable Lead Serial Number: 920623
Implantable Pulse Generator Implant Date: 20180912
Lead Channel Impedance Value: 569 Ohm
Lead Channel Impedance Value: 645 Ohm
Lead Channel Impedance Value: 747 Ohm
Lead Channel Setting Pacing Amplitude: 2 V
Lead Channel Setting Pacing Amplitude: 2.5 V
Lead Channel Setting Pacing Amplitude: 2.5 V
Lead Channel Setting Pacing Pulse Width: 0.4 ms
Lead Channel Setting Pacing Pulse Width: 0.4 ms
Lead Channel Setting Sensing Sensitivity: 0.5 mV
Lead Channel Setting Sensing Sensitivity: 1 mV
Pulse Gen Serial Number: 193993

## 2021-10-05 NOTE — Progress Notes (Signed)
EPIC Encounter for ICM Monitoring  Patient Name: Monica Solis is a 60 y.o. female Date: 10/05/2021 Primary Care Physican: Reynold Bowen, MD Primary Cardiologist: Gwenlyn Found Electrophysiologist: Lovena Le  10/05/2021 Weight: 325 lbs   Spoke with patient and heart failure questions reviewed.  Pt asymptomatic for fluid accumulation and feeling well.   HeartLogic HF Index is 1 suggesting fluid levels are within normal threshold range.     Prescribed: Hydrochlorothiazide 12.5 mg Take 12.5 mg by mouth daily as needed (fluid).    Labs: 12/27/2020 Creatinine 0.85, BUN 13, Potassium 4.5, Sodium 141, GFR 79 A complete set of results can be found in Results Review.   Recommendations:  No changes and encouraged to call if experiencing any fluid symptoms.   Follow-up plan: ICM clinic phone appointment on 11/06/2021.   91 day device clinic remote transmission 01/03/2022.              EP/Cardiology next office visit:   12/15/2021 with Dr. Gwenlyn Found          Copy of ICM check sent to Dr. Lovena Le.  3 Month Trend    8 Day Data Trend          [name], RN 10/05/2021 12:10 PM

## 2021-10-11 ENCOUNTER — Telehealth: Payer: Self-pay | Admitting: *Deleted

## 2021-10-11 NOTE — Telephone Encounter (Signed)
Patient called [facility] called her told her she needs diagnostic mammogram and left breast ultrasound and they need an order. Order faxed to [phone]. I asked patient to call tomorrow and let me know when she is scheduled.

## 2021-10-12 NOTE — Telephone Encounter (Signed)
Patient scheduled on 10/30/21 @ 2:30pm for breast imaging.

## 2021-10-13 NOTE — Progress Notes (Signed)
Remote ICD transmission.   

## 2021-10-31 ENCOUNTER — Encounter: Payer: Self-pay | Admitting: Nurse Practitioner

## 2021-11-06 ENCOUNTER — Ambulatory Visit (INDEPENDENT_AMBULATORY_CARE_PROVIDER_SITE_OTHER): Payer: 59

## 2021-11-06 DIAGNOSIS — I5022 Chronic systolic (congestive) heart failure: Secondary | ICD-10-CM | POA: Diagnosis not present

## 2021-11-06 DIAGNOSIS — Z95810 Presence of automatic (implantable) cardiac defibrillator: Secondary | ICD-10-CM

## 2021-11-08 NOTE — Progress Notes (Signed)
EPIC Encounter for ICM Monitoring  Patient Name: Monica Solis is a 61 y.o. female Date: 11/08/2021 Primary Care Physican: Reynold Bowen, MD Primary Cardiologist: Gwenlyn Found Electrophysiologist: Lovena Le  11/08/2021 Weight: 330 lbs   Spoke with patient and heart failure questions reviewed.  Pt asymptomatic for fluid accumulation.  Reports feeling well at this time and voices no complaints.    HeartLogic HF Index is 2 suggesting fluid levels are within normal threshold range.     Prescribed: Hydrochlorothiazide 12.5 mg Take 12.5 mg by mouth daily as needed (fluid).    Labs: 12/27/2020 Creatinine 0.85, BUN 13, Potassium 4.5, Sodium 141, GFR 79 A complete set of results can be found in Results Review.   Recommendations:  No changes and encouraged to call if experiencing any fluid symptoms.   Follow-up plan: ICM clinic phone appointment on 12/11/2021.   91 day device clinic remote transmission 01/03/2022.              EP/Cardiology next office visit:   12/15/2021 with Dr. Gwenlyn Found          Copy of ICM check sent to Dr. Lovena Le.  3 Month Trend    8 Day Data Trend          [name], RN 11/08/2021 8:08 AM

## 2021-12-11 ENCOUNTER — Ambulatory Visit (INDEPENDENT_AMBULATORY_CARE_PROVIDER_SITE_OTHER): Payer: 59

## 2021-12-11 DIAGNOSIS — I5042 Chronic combined systolic (congestive) and diastolic (congestive) heart failure: Secondary | ICD-10-CM

## 2021-12-11 DIAGNOSIS — I5022 Chronic systolic (congestive) heart failure: Secondary | ICD-10-CM | POA: Diagnosis not present

## 2021-12-11 DIAGNOSIS — Z95810 Presence of automatic (implantable) cardiac defibrillator: Secondary | ICD-10-CM

## 2021-12-15 ENCOUNTER — Other Ambulatory Visit: Payer: Self-pay

## 2021-12-15 ENCOUNTER — Ambulatory Visit: Payer: 59 | Admitting: Cardiovascular Disease

## 2021-12-15 ENCOUNTER — Encounter: Payer: Self-pay | Admitting: Cardiovascular Disease

## 2021-12-15 VITALS — BP 110/78 | HR 66 | Ht 69.0 in | Wt 338.0 lb

## 2021-12-15 DIAGNOSIS — Z6841 Body Mass Index (BMI) 40.0 and over, adult: Secondary | ICD-10-CM

## 2021-12-15 DIAGNOSIS — E66813 Obesity, class 3: Secondary | ICD-10-CM

## 2021-12-15 DIAGNOSIS — I454 Nonspecific intraventricular block: Secondary | ICD-10-CM

## 2021-12-15 DIAGNOSIS — I10 Essential (primary) hypertension: Secondary | ICD-10-CM | POA: Diagnosis not present

## 2021-12-15 DIAGNOSIS — I428 Other cardiomyopathies: Secondary | ICD-10-CM

## 2021-12-15 NOTE — Progress Notes (Signed)
Spoke with patient and heart failure questions reviewed.  Pt asymptomatic for fluid accumulation.  Reports feeling well at this time and voices no complaints. No changes and encouraged to call if experiencing any fluid symptoms. 

## 2021-12-15 NOTE — Assessment & Plan Note (Signed)
History of hyperlipidemia not on statin therapy followed by her PCP.  Her most recent lipid profile performed 10/25/2021 revealed total cholesterol 211, LDL 133 and HDL 42. ?

## 2021-12-15 NOTE — Patient Instructions (Signed)

## 2021-12-15 NOTE — Assessment & Plan Note (Signed)
History of hypertension a blood pressure measured today at 110/78.  She is on carvedilol and Entresto. ?

## 2021-12-15 NOTE — Assessment & Plan Note (Signed)
History of nonischemic cardiomyopathy status post cardiac catheterization by myself performed radially 01/03/2017 revealing normal coronary arteries with an EF in the 30 to 35% range.  She had class II-III congestive heart failure along with a left bundle branch block.  After optimizing her pharmacology for LV dysfunction her EF was unchanged.  I ultimately referred her to Dr. Crissie Sickles who successfully placed a BiV ICD (CRT) device 06/26/2017.  Follow-up echo performed 12/16/2017 revealed normalization of LV function with an EF in the 55 to 60% range.  She is completely asymptomatic at the current time.  She remains on carvedilol and Entresto. ?

## 2021-12-15 NOTE — Progress Notes (Signed)
EPIC Encounter for ICM Monitoring  Patient Name: Monica Solis is a 61 y.o. female Date: 12/15/2021 Primary Care Physican: Reynold Bowen, MD Primary Cardiologist: Gwenlyn Found Electrophysiologist: Lovena Le  11/08/2021 Weight: 330 lbs   Transmission reviewed.   HeartLogic HF Index is 3 suggesting fluid levels are within normal threshold range.     Prescribed: Hydrochlorothiazide 12.5 mg Take 12.5 mg by mouth daily as needed (fluid).    Labs: 12/27/2020 Creatinine 0.85, BUN 13, Potassium 4.5, Sodium 141, GFR 79 A complete set of results can be found in Results Review.   Recommendations:  No changes   Follow-up plan: ICM clinic phone appointment on 01/15/2022.   91 day device clinic remote transmission 01/03/2022.              EP/Cardiology next office visit:   12/15/2021 with Dr. Gwenlyn Found          Copy of ICM check sent to Dr. Lovena Le.   3 month ICM trend: 12/11/2021.    12-14 Month ICM trend:     [name], RN 12/15/2021 8:44 AM

## 2021-12-15 NOTE — Assessment & Plan Note (Signed)
Chronic. 

## 2021-12-15 NOTE — Assessment & Plan Note (Signed)
History of morbid obesity with a BMI of 50.  She initially was entertaining a Roux-en-Y bariatric surgical procedure but ultimately decided not to pursue this.  I did send her [facility] the [facility] center where she was treated for several months but ultimately decided not to go and ended up having chronic gallbladder disease requiring cholecystectomy.  She wishes to return. ?

## 2021-12-15 NOTE — Progress Notes (Signed)
12/15/2021 Marcell Pfeifer   08-10-1961  [id number]  Primary Physician Reynold Bowen, MD Primary Cardiologist: Lorretta Harp MD [name], [place]  HPI:  Monica Solis is a 61 y.o.  severely overweight married Caucasian female with no children who was referred by her general surgeon, Dr. Kieth Brightly,  for preoperative coronary vessel clearance prior to elective Roux-en-Y bariatric surgery. I last saw her in the office 12/13/2020.  Her risk factors for heart disease include treated diabetes, and hypertension. She does have a strong family history of heart disease with a father who died at age 70 of a myocardial infarction and brother at age 51. She has never had a heart attack or stroke. She denies chest pain or shortness of breath. She does have factor V Leiden deficiency has had DVT and pulmonary emboli in the past (2011). I performed a pharmacologic Myoview stress test that showed no evidence of ischemia but gated SPECT showed an ejection fraction of 35%. This was confirmed by 2-D echocardiography. She does now admit to episodes of chest burning which she has thought was reflux in the past. She underwent outpatien are without COVID-19 okay on left I believe 6 months ago I think we arrange at that way your bariatric surgery and on I know we we did a t coronary angiography. The right radial approach by myself 01/03/17 revealing normal coronary arteries with an EF in the 30-35% range. She does have symptoms of congestive heart failure as well, class II to class III, and left bundle-branch block. Since I saw her back in 3 months ago we have optimized her heart failure pharmacotherapy for her EF by 2-D echo performed 05/17/17 remained depressed at 30-35%   I referred her to Dr. Crissie Sickles for consideration of IV ICD implantation for CRT which he did successfully on 06/26/17.  Follow-up 2D echo performed 12/16/2017 revealed normal ejection fraction.   Since I spoke to her a year ago she continues to do  well.  Her EF in 2019 normalized up to 55 to 60%.  She no longer has congestive heart failure symptoms.  She does have obstructive sleep apnea but is intolerant to CPAP.  She occasionally gets dizzy in the morning and is somewhat hypotensive.  She saw Dr. Lovena Le for follow-up who recommended liberalization of her salt intake.   Current Meds  Medication Sig   carvedilol (COREG) 3.125 MG tablet Take 1 tablet (3.125 mg total) by mouth 2 (two) times daily.   Cholecalciferol (VITAMIN D3) 2000 UNITS capsule Take 4,000 Units by mouth daily with supper.   diclofenac sodium (VOLTAREN) 1 % GEL Apply 2 g topically 4 (four) times daily.   esomeprazole (NEXIUM) 40 MG capsule Take by mouth daily.   fexofenadine (ALLEGRA) 180 MG tablet Take 180 mg by mouth daily as needed for allergies or rhinitis.   gabapentin (NEURONTIN) 100 MG capsule Take 300 mg by mouth at bedtime. TAKE ONE TABLET IN THE MORNING AND TWO TABLETS AT NIGHT.   hydrochlorothiazide (HYDRODIURIL) 12.5 MG tablet Take 1 tablet (12.5 mg total) by mouth daily as needed.   insulin aspart (NOVOLOG) 100 UNIT/ML injection Inject 7-10 Units into the skin 3 (three) times daily as needed for high blood sugar. Per SSC 7-10units when cbg above 120   insulin detemir (LEVEMIR) 100 UNIT/ML injection Inject 40 Units into the skin at bedtime.    levothyroxine (SYNTHROID) 88 MCG tablet Take 88 mcg by mouth daily.   liraglutide (VICTOZA) 18 MG/3ML SOPN Inject  0.6 mg into the skin daily.   Menthol, Topical Analgesic, (BENGAY EX) Apply 1 application topically 2 (two) times daily as needed (pain).   sacubitril-valsartan (ENTRESTO) 24-26 MG Take 1 tablet by mouth 2 (two) times daily.   XARELTO 20 MG TABS tablet Take 1 tablet (20 mg total) by mouth daily.     Allergies  Allergen Reactions   Crestor [Rosuvastatin Calcium] Other (See Comments)    Muscle aches    Nexletol [Bempedoic Acid]    Other Swelling    Metal   Benadryl [Diphenhydramine Hcl] Hives    Canagliflozin Other (See Comments)    Loss of bladder control  Loss of bladder control Dysuria Dysuria   Metformin Other (See Comments)    Kidney damage Kidney damage Kidney damage Decreased kidney function   Pravastatin Other (See Comments) and Rash    Muscle ache Muscle ache Muscle ache   Tramadol Other (See Comments) and Rash    "nightmares" "nightmares" constipation    Social History   Socioeconomic History   Marital status: Married    Spouse name: Chrissie Noa   Number of children: Not on file   Years of education: Not on file   Highest education level: Not on file  Occupational History   Occupation: home maker  Tobacco Use   Smoking status: Former    Packs/day: 0.10    Years: 2.00    Pack years: 0.20    Types: Cigarettes    Quit date: 10/02/1979    Years since quitting: 42.2   Smokeless tobacco: Never  Vaping Use   Vaping Use: Never used  Substance and Sexual Activity   Alcohol use: No    Alcohol/week: 0.0 standard drinks   Drug use: No   Sexual activity: Not Currently    Partners: Male    Birth control/protection: Surgical    Comment: 1st intercourse 32 yo-1 partner-BTL  Other Topics Concern   Not on file  Social History Narrative   Not on file   Social Determinants of Health   Financial Resource Strain: Not on file  Food Insecurity: Not on file  Transportation Needs: Not on file  Physical Activity: Not on file  Stress: Not on file  Social Connections: Not on file  Intimate Partner Violence: Not on file     Review of Systems: General: negative for chills, fever, night sweats or weight changes.  Cardiovascular: negative for chest pain, dyspnea on exertion, edema, orthopnea, palpitations, paroxysmal nocturnal dyspnea or shortness of breath Dermatological: negative for rash Respiratory: negative for cough or wheezing Urologic: negative for hematuria Abdominal: negative for nausea, vomiting, diarrhea, bright red blood per rectum, melena, or  hematemesis Neurologic: negative for visual changes, syncope, or dizziness All other systems reviewed and are otherwise negative except as noted above.    Blood pressure 110/78, pulse 66, height 5\' 9"  (1.753 m), weight (!) 338 lb (153.3 kg), last menstrual period 08/07/2016.  General appearance: alert and no distress Neck: no adenopathy, no carotid bruit, no JVD, supple, symmetrical, trachea midline, and thyroid not enlarged, symmetric, no tenderness/mass/nodules Lungs: clear to auscultation bilaterally Heart: regular rate and rhythm, S1, S2 normal, no murmur, click, rub or gallop Extremities: extremities normal, atraumatic, no cyanosis or edema Pulses: 2+ and symmetric Skin: Skin color, texture, turgor normal. No rashes or lesions Neurologic: Grossly normal  EKG atrial sensed, ventricular paced rhythm at 66.  I personally reviewed this EKG.  ASSESSMENT AND PLAN:   BBB (bundle branch block) Chronic  Obesity History of morbid  obesity with a BMI of 50.  She initially was entertaining a Roux-en-Y bariatric surgical procedure but ultimately decided not to pursue this.  I did send her to the [facility] center where she was treated for several months but ultimately decided not to go and ended up having chronic gallbladder disease requiring cholecystectomy.  She wishes to return.  Primary hypertension History of hypertension a blood pressure measured today at 110/78.  She is on carvedilol and Entresto.  Hyperlipidemia History of hyperlipidemia not on statin therapy followed by her PCP.  Her most recent lipid profile performed 10/25/2021 revealed total cholesterol 211, LDL 133 and HDL 42.  Non-ischemic cardiomyopathy ([place]) History of nonischemic cardiomyopathy status post cardiac catheterization by myself performed radially 01/03/2017 revealing normal coronary arteries with an EF in the 30 to 35% range.  She had class II-III congestive heart failure along with a left bundle branch block.   After optimizing her pharmacology for LV dysfunction her EF was unchanged.  I ultimately referred her to Dr. Crissie Sickles who successfully placed a BiV ICD (CRT) device 06/26/2017.  Follow-up echo performed 12/16/2017 revealed normalization of LV function with an EF in the 55 to 60% range.  She is completely asymptomatic at the current time.  She remains on carvedilol and Entresto.     Lorretta Harp MD FACP,FACC,FAHA, [facility] 12/15/2021 10:12 AM

## 2021-12-22 ENCOUNTER — Other Ambulatory Visit: Payer: Self-pay | Admitting: Cardiovascular Disease

## 2022-01-03 ENCOUNTER — Ambulatory Visit (INDEPENDENT_AMBULATORY_CARE_PROVIDER_SITE_OTHER): Payer: 59

## 2022-01-03 DIAGNOSIS — I454 Nonspecific intraventricular block: Secondary | ICD-10-CM | POA: Diagnosis not present

## 2022-01-03 LAB — CUP PACEART REMOTE DEVICE CHECK
Battery Remaining Longevity: 96 mo
Battery Remaining Percentage: 100 %
Brady Statistic RA Percent Paced: 0 %
Brady Statistic RV Percent Paced: 1 %
Date Time Interrogation Session: 20230322005100
HighPow Impedance: 76 Ohm
Implantable Lead Implant Date: 20180912
Implantable Lead Implant Date: 20180912
Implantable Lead Implant Date: 20180912
Implantable Lead Location: 753858
Implantable Lead Location: 753859
Implantable Lead Location: 753860
Implantable Lead Model: 293
Implantable Lead Model: 4674
Implantable Lead Model: 7741
Implantable Lead Serial Number: 435131
Implantable Lead Serial Number: 813037
Implantable Lead Serial Number: 920623
Implantable Pulse Generator Implant Date: 20180912
Lead Channel Impedance Value: 606 Ohm
Lead Channel Impedance Value: 617 Ohm
Lead Channel Impedance Value: 776 Ohm
Lead Channel Setting Pacing Amplitude: 2 V
Lead Channel Setting Pacing Amplitude: 2.5 V
Lead Channel Setting Pacing Amplitude: 2.5 V
Lead Channel Setting Pacing Pulse Width: 0.4 ms
Lead Channel Setting Pacing Pulse Width: 0.4 ms
Lead Channel Setting Sensing Sensitivity: 0.5 mV
Lead Channel Setting Sensing Sensitivity: 1 mV
Pulse Gen Serial Number: 193993

## 2022-01-15 ENCOUNTER — Ambulatory Visit (INDEPENDENT_AMBULATORY_CARE_PROVIDER_SITE_OTHER): Payer: 59

## 2022-01-15 DIAGNOSIS — I5022 Chronic systolic (congestive) heart failure: Secondary | ICD-10-CM | POA: Diagnosis not present

## 2022-01-15 DIAGNOSIS — Z95810 Presence of automatic (implantable) cardiac defibrillator: Secondary | ICD-10-CM | POA: Diagnosis not present

## 2022-01-16 ENCOUNTER — Telehealth: Payer: Self-pay

## 2022-01-16 NOTE — Progress Notes (Signed)
EPIC Encounter for ICM Monitoring ? ?Patient Name: Monica Solis is a 61 y.o. female ?Date: 01/16/2022 ?Primary Care Physican: Reynold Bowen, MD ?Primary Cardiologist: Gwenlyn Found ?Electrophysiologist: Lovena Le  ?11/08/2021 Weight: 330 lbs ?  ?Attempted call to patient and unable to reach.   Transmission reviewed.   ?  ?HeartLogic HF Index is 0 suggesting fluid levels are within normal threshold range.   ?  ?Prescribed: Hydrochlorothiazide 12.5 mg Take 12.5 mg by mouth daily as needed (fluid).  ?  ?Labs: ?12/27/2020 Creatinine 0.85, BUN 13, Potassium 4.5, Sodium 141, GFR 79 ?A complete set of results can be found in Results Review. ?  ?Recommendations:  Unable to reach.   ?  ?Follow-up plan: ICM clinic phone appointment on 02/19/2022.   91 day device clinic remote transmission 04/09/2022.     ?  ?       EP/Cardiology next office visit:  Recall 07/29/2022 with Oda Kilts, PA or Tommye Standard, [place].  Recall 12/10/2022 with Dr Gwenlyn Found. ?  ?       Copy of ICM check sent to Dr. Lovena Le. ? ?3 Month Trend ? ? ? ?8 Day Data Trend ?       ? ? ?[name], RN ?01/16/2022 ?4:33 PM ?

## 2022-01-16 NOTE — Progress Notes (Signed)
Remote ICD transmission.   

## 2022-01-16 NOTE — Telephone Encounter (Signed)
Remote ICM transmission received.  Attempted call to patient regarding ICM remote transmission and no answer or voice mail option.  

## 2022-01-30 ENCOUNTER — Encounter: Payer: Self-pay | Admitting: Gastroenterology

## 2022-02-19 ENCOUNTER — Ambulatory Visit (INDEPENDENT_AMBULATORY_CARE_PROVIDER_SITE_OTHER): Payer: 59

## 2022-02-19 DIAGNOSIS — Z95810 Presence of automatic (implantable) cardiac defibrillator: Secondary | ICD-10-CM

## 2022-02-19 DIAGNOSIS — I5022 Chronic systolic (congestive) heart failure: Secondary | ICD-10-CM | POA: Diagnosis not present

## 2022-02-21 ENCOUNTER — Other Ambulatory Visit: Payer: Self-pay | Admitting: Gastroenterology

## 2022-02-21 ENCOUNTER — Encounter: Payer: Self-pay | Admitting: Gastroenterology

## 2022-02-21 ENCOUNTER — Ambulatory Visit: Payer: 59 | Admitting: Gastroenterology

## 2022-02-21 VITALS — BP 118/78 | HR 58 | Ht 69.0 in | Wt 335.8 lb

## 2022-02-21 DIAGNOSIS — R194 Change in bowel habit: Secondary | ICD-10-CM

## 2022-02-21 DIAGNOSIS — R1011 Right upper quadrant pain: Secondary | ICD-10-CM

## 2022-02-21 DIAGNOSIS — R1032 Left lower quadrant pain: Secondary | ICD-10-CM | POA: Diagnosis not present

## 2022-02-21 MED ORDER — DICYCLOMINE HCL 10 MG PO CAPS: 10.0000 mg | ORAL_CAPSULE | Freq: Three times a day (TID) | ORAL | 3 refills | Status: DC

## 2022-02-21 NOTE — Patient Instructions (Addendum)
It was my pleasure to provide care to you today. Based on our discussion, I am providing you with my recommendations below: ? ?RECOMMENDATION(S):  ? ?I recommend that you use a daily dose of Benefiber as a way to minimize diarrhea and constipation. ? ?I have recommended an upper endoscopy and a colonoscopy.  ? ?We discussed a trial of dicyclomine taken prior to meals to minimize pain and running to the bathroom.   ? ?PROCEDURE: ? ?I am recommending that you have a(n) upper endoscopy and colonoscopy completed. Due to availability at the hospital, my staff did not schedule the procedure(s) today. When we have a date available we will call to schedule your procedures. ? ?NOTE:  ? ?At the time of scheduling your procedure, you may also be scheduled for a pre-visit with my nurse. During this appointment, you will be provided with your prep instructions. ? ?FOLLOW UP: ? ?After your procedure, you will receive a call from my office staff regarding my recommendation for follow up. ? ?BMI: ? ?If you are age 61 or older, your body mass index should be between 23-30. Your Body mass index is 49.59 kg/m?[name] If this is out of the aforementioned range listed, please consider follow up with your Primary Care Provider. ? ?If you are age 62 or younger, your body mass index should be between 19-25. Your Body mass index is 49.59 kg/m?[name] If this is out of the aformentioned range listed, please consider follow up with your Primary Care Provider.  ? ?MY CHART: ? ?The [facility] providers would like to encourage you to use [facility] to communicate with providers for non-urgent requests or questions.  Due to long hold times on the telephone, sending your provider a message by [facility] may be a faster and more efficient way to get a response.  Please allow 48 business hours for a response.  Please remember that this is for non-urgent requests.  ? ?Thank you for trusting me with your gastrointestinal care!   ? ?Thornton Park, MD, MPH ? ?

## 2022-02-21 NOTE — Progress Notes (Signed)
? ?Referring Provider: Reynold Bowen, MD ?Primary Care Physician:  Reynold Bowen, MD ? ? ?Reason for Consultation: Urgency after gallbladder surgery ? ? ?IMPRESSION:  ?Right upper abdominal pain  ?Left lower abdominal pain ?Intermittent postprandial diarrhea ?Alternating diarrhea and constipation ?Extensive sigmoid diverticulosis with previous description of muscular hypertrophy on CT scan ?Prior colonoscopy 2014 ?History of ileitis on prior colonoscopy  ?   - findings of procedure not available, only pathology result ?   - no recent small bowel abnormalities on prior CT scan ?Occasional NSAIDs used to treat abdominal pain ?Recent cholecystectomy for gallstones ? ?Etiology of symptoms is unclear. Multiple processes may be involved. Although post-cholecystectomy changes may be contributing to some of her symptoms, I'm afraid that bile acid binders will result in severe constipation.  ? ?PLAN: ?- EGD with esophageal, gastric, and duodenal biopsies and Colonoscopy with evaluation of the TI at the hospital ?- Trial of Metamucil or Benefiber daily ?- Trial of dicyclomine 10 mg QID PRN ?- Avoid NSAIDs  ?- Obtain colonoscopy report from [facility] in 2014 ?- Low threshold for CT scan of the abd/pelvis ? ?HPI: Monica Solis is a 61 y.o. female referred by Dr. Forde Dandy for further evaluation of GI urgency after gallbladder surgery.  The history is obtained through the patient, review of her electronic health record, and records provided by Dr. Forde Dandy.  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 ? ?She was first diagnosed with diverticulitis 3/13. Has had multiple episodes over the years requiring  antibiotics. ? ?Participated in Healthy Weight program but she was unable to tolerate the foods because it would trigger her left-sided abdominal pain that she initially thought might be related to her diverticulitis.  ? ?Ultimately found to have symptomatic gallstones. She had a cholecystectomy for gallstones at [facility] 05/2021.  She reports a weeklong hospitalization and had removal of 116 stones.  The recovery was difficult for her.  ? ?She has continued to have intermittent soreness in the RUQ following the surgery.  ? ?She now reports severe alternating days of diarrhea followed by days of constipation with associated abdominal discomfort with both RUQ pain and LLQ pain. There is frequent post-prandial diarrhea.  Moving to soft foods helps the symptoms. Using a heating pad and BenGay provides some relief. ? ?Symptoms are effected by position. She has a difficulty finding a comfortable position to lie in at night because of the discomfort. No association in symptoms with eating or defecation.  ? ?No blood in the stool. She may have some mucous. No weight loss.  ? ?Colonoscopy in 2014 in [facility].  The procedure note is not available but results in care everywhere show acute ileitis. ? ?Mother required an ostomy related to diverticulitis. There is no known family history of colon cancer or polyps. No family history of stomach cancer or other GI malignancy. No family history of inflammatory bowel disease or celiac.  ? ?She is concerned that it could be her liver or pancreas.  ? ?Will only use ibuprofen with severe pain, otherwise she tries to use acetaminophen. ? ?Prior abdominal imaging: ?- CT of the abdomen and pelvis with contrast 12/02/2015 for abdominal pain showed muscular hypertrophy from chronic diverticulosis with extensive sigmoid diverticulosis ?- Upper GI series 10/18/2016 during consideration  for bariatric surgery: Normal ? ? ?Past Medical History:  ?Diagnosis Date  ? Abnormal uterine bleeding  (AUB)   ? AICD (automatic cardioverter/defibrillator) present   ? At risk for sleep apnea   ? STOP-BANG=  5       SENT TO PCP 10-01-2016  ? Bilateral swelling of feet   ? Blood clot in vein   ? "superficial blood clots in BLE; I had 6-7 since 2000" (06/26/2017)  ? BMI 45.0-49.9, adult ([place])   ? Chewing difficulty   ? CHF (congestive heart failure) ([place])   ? Chronic bronchitis ([place])   ? Constipation   ? Diverticulosis of colon   ? Edema   ? Endometrial polyp   ? Factor 5 Leiden mutation, heterozygous ([place])   ? dx 2011  ? GERD (gastroesophageal reflux disease)   ? High cholesterol   ? History of DVT of lower extremity 2000; 2011  ? "I've had them in both"  ? History of ectopic pregnancy 1998  ? Hyperlipidemia   ? Hypertension   ? Hypothyroidism   ? Left bundle branch block (LBBB)   ? Non-ischemic cardiomyopathy ([place])   ? Osteoarthritis   ? "all over" (06/26/2017)  ? Other fatigue   ? Peripheral neuropathy   ? PONV (postoperative nausea and vomiting)   ? Shortness of breath   ? Shortness of breath on exertion   ? Sleep apnea   ? Type 2 diabetes mellitus treated with insulin ([place])   ? Vitamin D deficiency   ? ? ?Past Surgical History:  ?Procedure Laterality Date  ? BI-VENTRICULAR IMPLANTABLE CARDIOVERTER DEFIBRILLATOR  (CRT-D)  06/26/2017  ? BIV ICD INSERTION CRT-D N/A 06/26/2017  ? Procedure: BIV ICD INSERTION CRT-D;  Surgeon: Evans Lance, MD;  Location: [place] [facility];  Service: Cardiovascular;  Laterality: N/A;  ? CHOLECYSTECTOMY N/A   ? COLONOSCOPY  2013  ? DILATATION & CURETTAGE/HYSTEROSCOPY WITH MYOSURE N/A 10/05/2016  ? Procedure: [place];  Surgeon: Anastasio Auerbach, MD;  Location: [place];  Service: Gynecology;  Laterality: N/A;  requests to follow 7:30am caserequests one hour OR time  ? DILATION AND CURETTAGE OF UTERUS  12/06/2007  ? LAPAROSCOPY BILATERAL TUBAL LIGATION WITH FALOPE RINGS/  D&C HYSTEROSCOPY WITH POLYPECTOMY  12/06/2007  ? and  Lysis Adhesions  ? LEFT HEART CATH AND CORONARY ANGIOGRAPHY N/A 01/03/2017  ? Procedure: Left Heart Cath and Coronary Angiography;  Surgeon: Lorretta Harp, MD;  Location: [place] [facility];  Service: Cardiovascular;  Laterality: N/A;  ? TUBAL LIGATION  12/06/2007  ? ?Current Outpatient Medications  ?Medication Sig Dispense Refill  ? carvedilol (COREG) 3.125 MG tablet TAKE 1 TABLET BY MOUTH 2 TIMES DAILY. 180 tablet 6  ? Cholecalciferol (VITAMIN D3) 2000 UNITS capsule Take 4,000 Units by mouth daily with supper.    ? dicyclomine (BENTYL) 10 MG capsule Take 1 capsule (10 mg total) by mouth 3 (three) times daily before meals. 90 capsule 3  ? esomeprazole (NEXIUM) 40 MG capsule Take by mouth daily.  5  ? fexofenadine (ALLEGRA) 180 MG tablet Take 180 mg by mouth daily as needed for allergies or rhinitis.    ? gabapentin (NEURONTIN) 100 MG capsule Take 300 mg by mouth See admin instructions. TAKE ONE TABLET IN THE MORNING AND TWO TABLETS AT NIGHT.    ? hydrochlorothiazide (HYDRODIURIL) 12.5 MG tablet TAKE 1 TABLET BY MOUTH DAILY AS NEEDED. (Patient taking differently: Take 12.5 mg by mouth daily.) 90 tablet  3  ? insulin aspart (NOVOLOG) 100 UNIT/ML injection Inject 7-10 Units into the skin 3 (three) times daily as needed for high blood sugar. Per SSC 7-10units when cbg above 120    ? insulin detemir (LEVEMIR) 100 UNIT/ML injection Inject 40 Units into the skin at bedtime.     ? levothyroxine (SYNTHROID) 88 MCG tablet Take 88 mcg by mouth daily.    ? Menthol, Topical Analgesic, (BENGAY EX) Apply 1 application topically 2 (two) times daily as needed (pain).    ? MULTIPLE VITAMINS-IRON PO Take 1 tablet by mouth daily.    ? sacubitril-valsartan (ENTRESTO) 24-26 MG TAKE 1 TABLET BY MOUTH TWICE A DAY 180 tablet 6  ? XARELTO 20 MG TABS tablet Take 1 tablet (20 mg total) by mouth daily. (Patient taking differently: Take 20 mg by mouth daily. Managed by Dr. Forde Dandy) 90 tablet 3  ? ?No current facility-administered medications  for this visit.  ? ?Facility-Administered Medications Ordered in Other Visits  ?Medication Dose Route Frequency Provider Last Rate Last Admin  ? technetium tetrofosmin (TC-MYOVIEW) injection 13.2 millicurie  3

## 2022-02-21 NOTE — Progress Notes (Signed)
EPIC Encounter for ICM Monitoring ? ?Patient Name: Monica Solis is a 61 y.o. female ?Date: 02/21/2022 ?Primary Care Physican: Reynold Bowen, MD ?Primary Cardiologist: Gwenlyn Found ?Electrophysiologist: Lovena Le  ?02/21/2022 Weight: 330 - 335 lbs ?  ?Spoke with patient and heart failure questions reviewed.  Pt asymptomatic for fluid accumulation but did have weight gain around 4/28. ?  ?HeartLogic HF Index is 3 suggesting fluid levels are within normal threshold range.   ?  ?Prescribed: Hydrochlorothiazide 12.5 mg Take 12.5 mg by mouth daily as needed (fluid).  ?  ?Labs: ?12/27/2020 Creatinine 0.85, BUN 13, Potassium 4.5, Sodium 141, GFR 79 ?A complete set of results can be found in Results Review. ?  ?Recommendations:  No changes and encouraged to call if experiencing any fluid symptoms. ?  ?Follow-up plan: ICM clinic phone appointment on 03/26/2022.   91 day device clinic remote transmission 04/09/2022.     ?  ?       EP/Cardiology next office visit:  Recall 07/29/2022 with Oda Kilts, PA or Tommye Standard, [place].  Recall 12/10/2022 with Dr Gwenlyn Found. ?  ?       Copy of ICM check sent to Dr. Lovena Le. ? ?3 Month Trend ? ? ? ?8 Day Data Trend ?       ? ? ?Monica Billings, RN ?02/21/2022 ?3:21 PM ?

## 2022-03-16 ENCOUNTER — Telehealth: Payer: Self-pay

## 2022-03-16 NOTE — Telephone Encounter (Signed)
Left message on Husband's cell for patient to call back to schedule colon with Dr. Tarri Glenn at [facility]. The main number provided was not in service.

## 2022-03-19 ENCOUNTER — Other Ambulatory Visit: Payer: Self-pay

## 2022-03-19 DIAGNOSIS — R194 Change in bowel habit: Secondary | ICD-10-CM

## 2022-03-19 DIAGNOSIS — R1011 Right upper quadrant pain: Secondary | ICD-10-CM

## 2022-03-19 DIAGNOSIS — R1032 Left lower quadrant pain: Secondary | ICD-10-CM

## 2022-03-19 MED ORDER — NA SULFATE-K SULFATE-MG SULF 17.5-3.13-1.6 GM/177ML PO SOLN: 1.0000 | Freq: Once | ORAL | 0 refills | Status: AC

## 2022-03-19 NOTE — Telephone Encounter (Signed)
Patient has been scheduled for endo colon at [facility] with Dr. Tarri Glenn on 05/21/22 at 8:30 am. Pre visit is scheduled for 05/07/22 at 10:00 am. Patient states she is diabetic & on Xarelto prescribed by Dr. Forde Dandy (PCP). Clearance letter has been sent to PCP. Amb ref & hospital orders placed. Prep sent to pharmacy.

## 2022-03-26 ENCOUNTER — Ambulatory Visit (INDEPENDENT_AMBULATORY_CARE_PROVIDER_SITE_OTHER): Payer: 59

## 2022-03-26 DIAGNOSIS — Z95810 Presence of automatic (implantable) cardiac defibrillator: Secondary | ICD-10-CM | POA: Diagnosis not present

## 2022-03-26 DIAGNOSIS — I5022 Chronic systolic (congestive) heart failure: Secondary | ICD-10-CM | POA: Diagnosis not present

## 2022-03-28 NOTE — Progress Notes (Signed)
EPIC Encounter for ICM Monitoring  Patient Name: Monica Solis is a 61 y.o. female Date: 03/28/2022 Primary Care Physican: Reynold Bowen, MD Primary Cardiologist: Gwenlyn Found Electrophysiologist: Lovena Le  02/21/2022 Weight: 330 - 335 lbs   Spoke with patient and heart failure questions reviewed.  Pt asymptomatic for fluid accumulation.   HeartLogic HF Index is 2 suggesting fluid levels are within normal threshold range.     Prescribed: Hydrochlorothiazide 12.5 mg Take 12.5 mg by mouth daily as needed (fluid).    Labs: 12/27/2020 Creatinine 0.85, BUN 13, Potassium 4.5, Sodium 141, GFR 79 A complete set of results can be found in Results Review.   Recommendations:  No changes and encouraged to call if experiencing any fluid symptoms.   Follow-up plan: ICM clinic phone appointment on 04/30/2022.   91 day device clinic remote transmission 04/09/2022.              EP/Cardiology next office visit:  Recall 07/29/2022 with Oda Kilts, PA or Tommye Standard, [place].  Recall 12/10/2022 with Dr Gwenlyn Found.          Copy of ICM check sent to Dr. Lovena Le.  3 Month Trend    8 Day Data Trend          [name], RN 03/28/2022 9:08 AM

## 2022-04-04 ENCOUNTER — Ambulatory Visit (INDEPENDENT_AMBULATORY_CARE_PROVIDER_SITE_OTHER): Payer: 59

## 2022-04-04 DIAGNOSIS — I428 Other cardiomyopathies: Secondary | ICD-10-CM

## 2022-04-04 NOTE — Telephone Encounter (Signed)
Good morning- I do not see a Xarelto hold on this pt- DO you have one that has not been scanned?  Thx  marie PV

## 2022-04-04 NOTE — Telephone Encounter (Signed)
Wonderful thank you.

## 2022-04-06 LAB — CUP PACEART REMOTE DEVICE CHECK
Battery Remaining Longevity: 90 mo
Battery Remaining Percentage: 98 %
Brady Statistic RA Percent Paced: 0 %
Brady Statistic RV Percent Paced: 1 %
Date Time Interrogation Session: 20230622151700
HighPow Impedance: 80 Ohm
Implantable Lead Implant Date: 20180912
Implantable Lead Implant Date: 20180912
Implantable Lead Implant Date: 20180912
Implantable Lead Location: 753858
Implantable Lead Location: 753859
Implantable Lead Location: 753860
Implantable Lead Model: 293
Implantable Lead Model: 4674
Implantable Lead Model: 7741
Implantable Lead Serial Number: 435131
Implantable Lead Serial Number: 813037
Implantable Lead Serial Number: 920623
Implantable Pulse Generator Implant Date: 20180912
Lead Channel Impedance Value: 572 Ohm
Lead Channel Impedance Value: 666 Ohm
Lead Channel Impedance Value: 808 Ohm
Lead Channel Setting Pacing Amplitude: 2 V
Lead Channel Setting Pacing Amplitude: 2.5 V
Lead Channel Setting Pacing Amplitude: 2.5 V
Lead Channel Setting Pacing Pulse Width: 0.4 ms
Lead Channel Setting Pacing Pulse Width: 0.4 ms
Lead Channel Setting Sensing Sensitivity: 0.5 mV
Lead Channel Setting Sensing Sensitivity: 1 mV
Pulse Gen Serial Number: 193993

## 2022-04-11 NOTE — Telephone Encounter (Signed)
Monica Solis,  Did you ever get the Xarelto hold on this pt?  Lelan Pons PV

## 2022-04-16 NOTE — Progress Notes (Signed)
Remote ICD transmission.   

## 2022-04-27 ENCOUNTER — Telehealth: Payer: Self-pay

## 2022-04-27 ENCOUNTER — Telehealth: Payer: Self-pay | Admitting: *Deleted

## 2022-04-27 NOTE — Telephone Encounter (Signed)
[facility] Pre-operative Risk Assessment     Request for surgical clearance:     Endoscopy Procedure  What type of surgery is being performed?     EGD  When is this surgery scheduled?     05/21/22  What type of clearance is required ?   Pharmacy  Are there any medications that need to be held prior to surgery and how long? Xarelto   Practice name and name of physician performing surgery?      [facility]  What is your office phone and fax number?      Phone- [phone]  [phone]  Anesthesia type (None, local, MAC, general) ?       MAC

## 2022-04-27 NOTE — Telephone Encounter (Signed)
   Pre-operative Risk Assessment    Patient Name: Monica Solis  DOB: 1961-02-21 MRN: [id number]      Request for Surgical Clearance    Procedure:   EGD/COLONOSCOPY  Date of Surgery:  Clearance 05/21/22                                 Surgeon:  DR. Thornton Park Surgeon's Group or Practice Name:   Phone number:  [phone] Fax number:  [phone]   Type of Clearance Requested:   - Medical  - Pharmacy:  Hold Rivaroxaban (Xarelto) NOTES ON MEDICATION LIST [place], PCP   Type of Anesthesia:   PROPOFOL   Additional requests/questions:    Jiles Prows   04/27/2022, 2:21 PM

## 2022-04-30 ENCOUNTER — Ambulatory Visit (INDEPENDENT_AMBULATORY_CARE_PROVIDER_SITE_OTHER): Payer: 59

## 2022-04-30 DIAGNOSIS — Z95810 Presence of automatic (implantable) cardiac defibrillator: Secondary | ICD-10-CM | POA: Diagnosis not present

## 2022-04-30 DIAGNOSIS — I5022 Chronic systolic (congestive) heart failure: Secondary | ICD-10-CM | POA: Diagnosis not present

## 2022-05-01 ENCOUNTER — Telehealth: Payer: Self-pay | Admitting: *Deleted

## 2022-05-01 NOTE — Telephone Encounter (Signed)
   Name: Monica Solis  DOB: 1961-06-06  MRN: [id number]  Primary Cardiologist: Quay Burow, MD   Preoperative team, please contact this patient and set up a phone call appointment for further preoperative risk assessment. Please obtain consent and complete medication review. Thank you for your help.  Hx of She does have factor V Leiden deficiency has had DVT and pulmonary emboli in the past (2011). Please review anticoagulation during phone visit. Likely clearance from PCP.     [place], [place] 05/01/2022, 8:46 AM [place] [address] [place], [place] [address]

## 2022-05-01 NOTE — Telephone Encounter (Signed)
Pt agreeable to plan for the tele visit for the pre op clearance 05/08/22 @ 10 am. Med rec and consent are done.

## 2022-05-01 NOTE — Telephone Encounter (Signed)
Pt agreeable to plan for the tele visit for the pre op clearance 05/08/22 @ 10 am. Med rec and consent are done.     Patient Consent for Virtual Visit        Monica Solis has provided verbal consent on 05/01/2022 for a virtual visit (video or telephone).   CONSENT FOR VIRTUAL VISIT FOR:  Monica Solis  By participating in this virtual visit I agree to the following:  I hereby voluntarily request, consent and authorize [facility] and its employed or contracted physicians, physician assistants, nurse practitioners or other licensed health care professionals (the Practitioner), to provide me with telemedicine health care services (the "Services") as deemed necessary by the treating Practitioner. I acknowledge and consent to receive the Services by the Practitioner via telemedicine. I understand that the telemedicine visit will involve communicating with the Practitioner through live audiovisual communication technology and the disclosure of certain medical information by electronic transmission. I acknowledge that I have been given the opportunity to request an in-person assessment or other available alternative prior to the telemedicine visit and am voluntarily participating in the telemedicine visit.  I understand that I have the right to withhold or withdraw my consent to the use of telemedicine in the course of my care at any time, without affecting my right to future care or treatment, and that the Practitioner or I may terminate the telemedicine visit at any time. I understand that I have the right to inspect all information obtained and/or recorded in the course of the telemedicine visit and may receive copies of available information for a reasonable fee.  I understand that some of the potential risks of receiving the Services via telemedicine include:  Delay or interruption in medical evaluation due to technological equipment failure or disruption; Information transmitted may not be  sufficient (e.g. poor resolution of images) to allow for appropriate medical decision making by the Practitioner; and/or  In rare instances, security protocols could fail, causing a breach of personal health information.  Furthermore, I acknowledge that it is my responsibility to provide information about my medical history, conditions and care that is complete and accurate to the best of my ability. I acknowledge that Practitioner's advice, recommendations, and/or decision may be based on factors not within their control, such as incomplete or inaccurate data provided by me or distortions of diagnostic images or specimens that may result from electronic transmissions. I understand that the practice of medicine is not an exact science and that Practitioner makes no warranties or guarantees regarding treatment outcomes. I acknowledge that a copy of this consent can be made available to me via my patient portal ([facility]), or I can request a printed copy by calling the office of [facility].    I understand that my insurance will be billed for this visit.   I have read or had this consent read to me. I understand the contents of this consent, which adequately explains the benefits and risks of the Services being provided via telemedicine.  I have been provided ample opportunity to ask questions regarding this consent and the Services and have had my questions answered to my satisfaction. I give my informed consent for the services to be provided through the use of telemedicine in my medical care

## 2022-05-04 ENCOUNTER — Telehealth: Payer: Self-pay

## 2022-05-04 NOTE — Progress Notes (Signed)
EPIC Encounter for ICM Monitoring  Patient Name: Monica Solis is a 61 y.o. female Date: 05/04/2022 Primary Care Physican: Reynold Bowen, MD Primary Cardiologist: Gwenlyn Found Electrophysiologist: Lovena Le  02/21/2022 Weight: 330 - 335 lbs   Attempted call to patient and unable to reach.  Left d shopetailed message per DPR regarding transmission. Transmission reviewed.    HeartLogic HF Index is 4 suggesting fluid levels are within normal threshold range.     Prescribed: Hydrochlorothiazide 12.5 mg Take 12.5 mg by mouth daily as needed (fluid).    Labs: 12/27/2020 Creatinine 0.85, BUN 13, Potassium 4.5, Sodium 141, GFR 79 A complete set of results can be found in Results Review.   Recommendations:  Left voice mail with ICM number and encouraged to call if experiencing any fluid symptoms.   Follow-up plan: ICM clinic phone appointment on 06/04/2022.   91 day device clinic remote transmission 07/04/2022.              EP/Cardiology next office visit:  Recall 07/29/2022 with Oda Kilts, PA or Tommye Standard, [place].  Recall 12/10/2022 with Dr Gwenlyn Found.          Copy of ICM check sent to Dr. Lovena Le.  3 Month Trend    8 Day Data Trend          [name], RN 05/04/2022 7:46 AM

## 2022-05-04 NOTE — Telephone Encounter (Signed)
Remote ICM transmission received.  Attempted call to patient regarding ICM remote transmission and left detailed message per DPR.  Advised to return call for any fluid symptoms or questions. Next ICM remote transmission scheduled 06/04/2022.

## 2022-05-07 ENCOUNTER — Telehealth: Payer: Self-pay | Admitting: Gastroenterology

## 2022-05-07 ENCOUNTER — Ambulatory Visit (AMBULATORY_SURGERY_CENTER): Payer: Self-pay | Admitting: *Deleted

## 2022-05-07 VITALS — Ht 69.0 in | Wt 345.0 lb

## 2022-05-07 DIAGNOSIS — R194 Change in bowel habit: Secondary | ICD-10-CM

## 2022-05-07 DIAGNOSIS — R1011 Right upper quadrant pain: Secondary | ICD-10-CM

## 2022-05-07 DIAGNOSIS — R1032 Left lower quadrant pain: Secondary | ICD-10-CM

## 2022-05-07 NOTE — Progress Notes (Addendum)
Patient is here in-person for PV. Patient denies any allergies to eggs or soy. Patient denies any problems with anesthesia/sedation. Patient is not on any oxygen at home. Patient is not taking any diet/weight loss medications. Patient is taking xarelto-she will have phone visit tomorrow with cardiology to get xarelto clearance. Went over procedure prep instructions with the patient. Patient is aware of our care-partner policy. Patient has suprep at home.

## 2022-05-07 NOTE — Telephone Encounter (Signed)
Patient opened her prep box and explained what was inside and the prep name-she has the generic suprep at home. Pt aware.

## 2022-05-08 ENCOUNTER — Telehealth: Payer: Self-pay | Admitting: *Deleted

## 2022-05-08 ENCOUNTER — Telehealth: Payer: Self-pay

## 2022-05-08 ENCOUNTER — Ambulatory Visit (INDEPENDENT_AMBULATORY_CARE_PROVIDER_SITE_OTHER): Payer: 59 | Admitting: Nurse Practitioner

## 2022-05-08 DIAGNOSIS — Z01810 Encounter for preprocedural cardiovascular examination: Secondary | ICD-10-CM | POA: Diagnosis not present

## 2022-05-08 NOTE — Telephone Encounter (Signed)
Unable to reach Tisha, Dr. Baldwin Crown CMA regarding clearance. Will try again at a later time.

## 2022-05-08 NOTE — Progress Notes (Signed)
Virtual Visit via Telephone Note   Because of Ceana Palmateer's co-morbid illnesses, she is at least at moderate risk for complications without adequate follow up.  This format is felt to be most appropriate for this patient at this time.  The patient did not have access to video technology/had technical difficulties with video requiring transitioning to audio format only (telephone).  All issues noted in this document were discussed and addressed.  No physical exam could be performed with this format.  Please refer to the patient's chart for her consent to telehealth for [facility].  Evaluation Performed:  Preoperative cardiovascular risk assessment _____________   Date:  05/08/2022   Patient ID:  Monica Solis, DOB 06/13/1961, MRN [id number] Patient Location:  Home Provider location:   Office  Primary Care Provider:  Reynold Bowen, MD Primary Cardiologist:  Quay Burow, MD  Chief Complaint / Patient Profile   61 y.o. y/o female with a h/o NICM s/p BiV ICD (CRT), BBB, hypertension, hyperlipidemia, DVT, factor V Leiden mutation, type 2 diabetes, hypothyroidism, and obesity who is pending EGD/colonoscopy on 05/21/2022 with Dr. Thornton Park and presents today for telephonic preoperative cardiovascular risk assessment.  Past Medical History    Past Medical History:  Diagnosis Date   Abnormal uterine bleeding (AUB)    AICD (automatic cardioverter/defibrillator) present    At risk for sleep apnea    STOP-BANG=  5       SENT TO PCP 10-01-2016   Bilateral swelling of feet    Blood clot in vein    "superficial blood clots in BLE; I had 6-7 since 2000" (06/26/2017)   BMI 45.0-49.9, adult (HCC)    Chewing difficulty    CHF (congestive heart failure) (HCC)    Chronic bronchitis (HCC)    Constipation    Diverticulosis of colon    Edema    Endometrial polyp    Factor 5 Leiden mutation, heterozygous ([place])    dx 2011   GERD (gastroesophageal reflux disease)    High cholesterol     History of DVT of lower extremity 2000; 2011   "I've had them in both"   History of ectopic pregnancy 1998   Hyperlipidemia    Hypertension    Hypothyroidism    Left bundle branch block (LBBB)    Non-ischemic cardiomyopathy ([place])    Osteoarthritis    "all over" (06/26/2017)   Other fatigue    Peripheral neuropathy    PONV (postoperative nausea and vomiting)    Shortness of breath    Shortness of breath on exertion    Sleep apnea    no CPAP   Type 2 diabetes mellitus treated with insulin ([place])    Vitamin D deficiency    Past Surgical History:  Procedure Laterality Date   BI-VENTRICULAR IMPLANTABLE CARDIOVERTER DEFIBRILLATOR  (CRT-D)  06/26/2017   BIV ICD INSERTION CRT-D N/A 06/26/2017   Procedure: BIV ICD INSERTION CRT-D;  Surgeon: Evans Lance, MD;  Location: [place] [facility];  Service: Cardiovascular;  Laterality: N/A;   CHOLECYSTECTOMY N/A 06/02/2021   COLONOSCOPY  2013   DILATATION & CURETTAGE/HYSTEROSCOPY WITH MYOSURE N/A 10/05/2016   Procedure: [place];  Surgeon: Anastasio Auerbach, MD;  Location: [place];  Service: Gynecology;  Laterality: N/A;  requests to follow 7:30am caserequests one hour OR time   DILATION AND CURETTAGE OF UTERUS  12/06/2007   LAPAROSCOPY BILATERAL TUBAL LIGATION WITH FALOPE RINGS/  D&C HYSTEROSCOPY WITH POLYPECTOMY  12/06/2007   and  Lysis Adhesions   LEFT HEART CATH AND CORONARY ANGIOGRAPHY N/A 01/03/2017   Procedure: Left Heart Cath and Coronary Angiography;  Surgeon: Lorretta Harp, MD;  Location: [place] [facility];  Service: Cardiovascular;  Laterality: N/A;   TUBAL LIGATION  12/06/2007    Allergies  Allergies  Allergen Reactions   Crestor [Rosuvastatin Calcium] Other (See Comments)    Muscle aches    Nexletol [Bempedoic Acid]    Other Hives    Shingles vaccine   Benadryl [Diphenhydramine Hcl] Hives   Canagliflozin Other (See Comments)    Loss of bladder control     Metformin Other (See Comments)    Kidney damage    Pravastatin Rash and Other (See Comments)    Muscle ache   Tramadol Rash and Other (See Comments)    "nightmares"    Victoza [Liraglutide] Other (See Comments)    "Fainting" "Fainting" Hypotension,     History of Present Illness    Monica Solis is a 61 y.o. female who presents via audio/video conferencing for a telehealth visit today.  Pt was last seen in cardiology clinic on 12/15/2021 by Dr Gwenlyn Found. At that time Shemekia Patane was doing well. The patient is now pending procedure as outlined above. Since her last visit, she has been stable from a cardiac standpoint.  Occasional dizziness with positional changes, however, this is not new. She denies chest pain, palpitations, dyspnea, pnd, orthopnea, n, v, syncope, edema, weight gain, or early satiety. All other systems reviewed and are otherwise negative except as noted above.   Home Medications    Prior to Admission medications   Medication Sig Start Date End Date Taking? Authorizing Provider  carvedilol (COREG) 3.125 MG tablet TAKE 1 TABLET BY MOUTH 2 TIMES DAILY. 12/22/21   Lorretta Harp, MD  Cholecalciferol (VITAMIN D3) 2000 UNITS capsule Take 4,000 Units by mouth daily with supper.    [provider]  dicyclomine (BENTYL) 10 MG capsule TAKE 1 CAPSULE (10 MG TOTAL) BY MOUTH 3 (THREE) TIMES DAILY BEFORE MEALS. 02/22/22   Thornton Park, MD  esomeprazole (NEXIUM) 40 MG capsule Take by mouth daily. 04/03/18   [provider]  fexofenadine (ALLEGRA) 180 MG tablet Take 180 mg by mouth daily as needed for allergies or rhinitis.    [provider]  gabapentin (NEURONTIN) 100 MG capsule Take 300 mg by mouth See admin instructions. TAKE ONE TABLET IN THE MORNING AND TWO TABLETS AT NIGHT. 09/08/16   [provider]  hydrochlorothiazide (HYDRODIURIL) 12.5 MG tablet TAKE 1 TABLET BY MOUTH DAILY AS NEEDED. Patient taking differently: Take 12.5 mg by mouth  daily. 12/22/21   Lorretta Harp, MD  insulin aspart (NOVOLOG) 100 UNIT/ML injection Inject 7-10 Units into the skin 3 (three) times daily as needed for high blood sugar. Per SSC 7-10units when cbg above 120    [provider]  insulin detemir (LEVEMIR) 100 UNIT/ML injection Inject 40 Units into the skin at bedtime.     [provider]  levothyroxine (SYNTHROID) 88 MCG tablet Take 88 mcg by mouth daily. 05/17/21   [provider]  Menthol, Topical Analgesic, (BENGAY EX) Apply 1 application topically 2 (two) times daily as needed (pain).    [provider]  Multiple Vitamin (MULTIVITAMIN) tablet Take 1 tablet by mouth daily.    [provider]  MULTIPLE VITAMINS-IRON PO Take 1 tablet by mouth daily.    [provider]  sacubitril-valsartan (ENTRESTO) 24-26 MG TAKE 1 TABLET BY MOUTH TWICE  A DAY 12/22/21   Lorretta Harp, MD  XARELTO 20 MG TABS tablet Take 1 tablet (20 mg total) by mouth daily. Patient taking differently: Take 20 mg by mouth daily. Managed by Dr. Forde Dandy 12/13/20   Lorretta Harp, MD    Physical Exam    Vital Signs:  Avaeh Ewer does not have vital signs available for review today.   Given telephonic nature of communication, physical exam is limited. AAOx3. NAD. Normal affect.  Speech and respirations are unlabored.  Accessory Clinical Findings    None  Assessment & Plan    1.  Preoperative Cardiovascular Risk Assessment:  According to the Revised Cardiac Risk Index (RCRI), her Perioperative Risk of Major Cardiac Event is (%): 6.6. Her Functional Capacity in METs is: 5.38 according to the Duke Activity Status Index (DASI).Therefore, based on ACC/AHA guidelines, patient would be at acceptable risk for the planned procedure without further cardiovascular testing.   Patient on Xarelto for history of DVT with factor V Leiden mutation, not managed by cardiology. Recommendations for holding Xarelto prior to procedure  should come from managing provider (PCP).  A copy of this note will be routed to requesting surgeon.  Time:   Today, I have spent 7 minutes with the patient with telehealth technology discussing medical history, symptoms, and management plan.     Lenna Sciara, NP  05/08/2022, 10:19 AM

## 2022-05-08 NOTE — Telephone Encounter (Signed)
Monica Solis,  Please see cardiology visit today. We need Xarelto hold from her PCP. Patient had her PV yesterday. Thank you, [name] PV

## 2022-05-09 NOTE — Telephone Encounter (Signed)
Left detailed message for Tisha with Dr. Baldwin Crown office.

## 2022-05-09 NOTE — Telephone Encounter (Signed)
Received call from [place] from PCP office. She is going to follow up with Dr. Forde Dandy in regards to the cardiologist note recommending that clearance come from PCP, and will call back tomorrow.

## 2022-05-10 NOTE — Telephone Encounter (Signed)
Received fax from [facility] stating that patient "only needs to hold dose the day before procedure. Can resume dose day after procedure" in regards to Xarelto clearance. Spoke with patient & she is aware.

## 2022-05-10 NOTE — Telephone Encounter (Signed)
Per Monica Solis with PCP office, she is going to fax over Dr. Baldwin Crown recommendations for Xarelto hold.

## 2022-05-14 ENCOUNTER — Encounter (HOSPITAL_COMMUNITY): Payer: Self-pay | Admitting: Gastroenterology

## 2022-05-20 NOTE — Anesthesia Preprocedure Evaluation (Addendum)
Anesthesia Evaluation  Patient identified by MRN, date of birth, ID band Patient awake    Reviewed: Allergy & Precautions, NPO status , Patient's Chart, lab work & pertinent test results  Airway Mallampati: II  TM Distance: >3 FB Neck ROM: Full    Dental no notable dental hx. (+) Edentulous Upper, Edentulous Lower   Pulmonary sleep apnea , former smoker,    Pulmonary exam normal breath sounds clear to auscultation       Cardiovascular hypertension, +CHF  Normal cardiovascular exam+ dysrhythmias + Cardiac Defibrillator  Rhythm:Regular Rate:Normal  fraction was in the  range of 55% to 60%   Neuro/Psych    GI/Hepatic GERD  ,  Endo/Other  diabetesHypothyroidism Morbid obesity  Renal/GU      Musculoskeletal  (+) Arthritis ,   Abdominal (+) + obese,   Peds  Hematology   Anesthesia Other Findings ALL: see list  Reproductive/Obstetrics                            Anesthesia Physical Anesthesia Plan  ASA: 4  Anesthesia Plan: MAC   Post-op Pain Management: Minimal or no pain anticipated   Induction: Intravenous  PONV Risk Score and Plan: Treatment may vary due to age or medical condition, Propofol infusion and TIVA  Airway Management Planned: Natural Airway, Nasal Cannula and Simple Face Mask  Additional Equipment: None  Intra-op Plan:   Post-operative Plan:   Informed Consent: I have reviewed the patients History and Physical, chart, labs and discussed the procedure including the risks, benefits and alternatives for the proposed anesthesia with the patient or authorized representative who has indicated his/her understanding and acceptance.     Dental advisory given  Plan Discussed with:   Anesthesia Plan Comments: (RUQ/LUQ pain and Change in Bowel habits)       Anesthesia Quick Evaluation

## 2022-05-21 ENCOUNTER — Encounter (HOSPITAL_COMMUNITY)
Admission: RE | Disposition: A | Discharge status: Home / Self Care | Payer: Self-pay | Source: Home / Self Care | Attending: Gastroenterology

## 2022-05-21 ENCOUNTER — Other Ambulatory Visit: Payer: Self-pay

## 2022-05-21 ENCOUNTER — Ambulatory Visit (HOSPITAL_COMMUNITY): Payer: 59 | Admitting: Certified Registered Nurse Anesthetist

## 2022-05-21 ENCOUNTER — Ambulatory Visit (HOSPITAL_BASED_OUTPATIENT_CLINIC_OR_DEPARTMENT_OTHER): Payer: 59 | Admitting: Certified Registered Nurse Anesthetist

## 2022-05-21 ENCOUNTER — Ambulatory Visit (HOSPITAL_COMMUNITY)
Admission: RE | Admit: 2022-05-21 | Discharge: 2022-05-21 | Disposition: A | Discharge status: Home / Self Care | Payer: 59 | Attending: Gastroenterology | Admitting: Gastroenterology

## 2022-05-21 ENCOUNTER — Encounter (HOSPITAL_COMMUNITY): Payer: Self-pay | Admitting: Gastroenterology

## 2022-05-21 DIAGNOSIS — K5730 Diverticulosis of large intestine without perforation or abscess without bleeding: Secondary | ICD-10-CM | POA: Diagnosis not present

## 2022-05-21 DIAGNOSIS — R1032 Left lower quadrant pain: Secondary | ICD-10-CM | POA: Diagnosis not present

## 2022-05-21 DIAGNOSIS — D125 Benign neoplasm of sigmoid colon: Secondary | ICD-10-CM | POA: Diagnosis not present

## 2022-05-21 DIAGNOSIS — K317 Polyp of stomach and duodenum: Secondary | ICD-10-CM | POA: Insufficient documentation

## 2022-05-21 DIAGNOSIS — R194 Change in bowel habit: Secondary | ICD-10-CM

## 2022-05-21 DIAGNOSIS — K6389 Other specified diseases of intestine: Secondary | ICD-10-CM | POA: Diagnosis not present

## 2022-05-21 DIAGNOSIS — K3189 Other diseases of stomach and duodenum: Secondary | ICD-10-CM | POA: Diagnosis not present

## 2022-05-21 DIAGNOSIS — Z87891 Personal history of nicotine dependence: Secondary | ICD-10-CM | POA: Diagnosis not present

## 2022-05-21 DIAGNOSIS — K633 Ulcer of intestine: Secondary | ICD-10-CM | POA: Diagnosis not present

## 2022-05-21 DIAGNOSIS — Z6841 Body Mass Index (BMI) 40.0 and over, adult: Secondary | ICD-10-CM | POA: Insufficient documentation

## 2022-05-21 DIAGNOSIS — I509 Heart failure, unspecified: Secondary | ICD-10-CM | POA: Diagnosis not present

## 2022-05-21 DIAGNOSIS — K5900 Constipation, unspecified: Secondary | ICD-10-CM | POA: Diagnosis not present

## 2022-05-21 DIAGNOSIS — K529 Noninfective gastroenteritis and colitis, unspecified: Secondary | ICD-10-CM

## 2022-05-21 DIAGNOSIS — R197 Diarrhea, unspecified: Secondary | ICD-10-CM | POA: Diagnosis not present

## 2022-05-21 DIAGNOSIS — G4730 Sleep apnea, unspecified: Secondary | ICD-10-CM | POA: Insufficient documentation

## 2022-05-21 DIAGNOSIS — D122 Benign neoplasm of ascending colon: Secondary | ICD-10-CM

## 2022-05-21 DIAGNOSIS — R1011 Right upper quadrant pain: Secondary | ICD-10-CM | POA: Diagnosis not present

## 2022-05-21 DIAGNOSIS — I110 Hypertensive heart disease with heart failure: Secondary | ICD-10-CM | POA: Insufficient documentation

## 2022-05-21 DIAGNOSIS — E119 Type 2 diabetes mellitus without complications: Secondary | ICD-10-CM | POA: Insufficient documentation

## 2022-05-21 DIAGNOSIS — Z95810 Presence of automatic (implantable) cardiac defibrillator: Secondary | ICD-10-CM | POA: Diagnosis not present

## 2022-05-21 DIAGNOSIS — K635 Polyp of colon: Secondary | ICD-10-CM | POA: Diagnosis not present

## 2022-05-21 DIAGNOSIS — Z9049 Acquired absence of other specified parts of digestive tract: Secondary | ICD-10-CM | POA: Insufficient documentation

## 2022-05-21 HISTORY — PX: BIOPSY: SHX5522

## 2022-05-21 HISTORY — PX: POLYPECTOMY: SHX5525

## 2022-05-21 HISTORY — PX: ESOPHAGOGASTRODUODENOSCOPY (EGD) WITH PROPOFOL: SHX5813

## 2022-05-21 HISTORY — PX: COLONOSCOPY WITH PROPOFOL: SHX5780

## 2022-05-21 LAB — GLUCOSE, CAPILLARY: Glucose-Capillary: 160 mg/dL — ABNORMAL HIGH (ref 70–99)

## 2022-05-21 SURGERY — ESOPHAGOGASTRODUODENOSCOPY (EGD) WITH PROPOFOL
Anesthesia: Monitor Anesthesia Care

## 2022-05-21 MED ORDER — LIDOCAINE 2% (20 MG/ML) 5 ML SYRINGE
INTRAMUSCULAR | Status: DC | PRN
  Administered 2022-05-21: 60 mg via INTRAVENOUS

## 2022-05-21 MED ORDER — SODIUM CHLORIDE 0.9 % IV SOLN: INTRAVENOUS | Status: DC

## 2022-05-21 MED ORDER — PROPOFOL 500 MG/50ML IV EMUL
INTRAVENOUS | Status: DC | PRN
  Administered 2022-05-21: 100 ug/kg/min via INTRAVENOUS

## 2022-05-21 MED ORDER — DEXMEDETOMIDINE (PRECEDEX) IN NS 20 MCG/5ML (4 MCG/ML) IV SYRINGE
PREFILLED_SYRINGE | INTRAVENOUS | Status: DC | PRN
  Administered 2022-05-21 (×2): 4 ug via INTRAVENOUS

## 2022-05-21 MED ORDER — LACTATED RINGERS IV SOLN: INTRAVENOUS | Status: DC

## 2022-05-21 MED ORDER — DEXMEDETOMIDINE HCL IN NACL 80 MCG/20ML IV SOLN
INTRAVENOUS | Status: AC
  Filled 2022-05-21: qty 20

## 2022-05-21 MED ORDER — PROPOFOL 10 MG/ML IV BOLUS
INTRAVENOUS | Status: DC | PRN
  Administered 2022-05-21 (×3): 20 mg via INTRAVENOUS

## 2022-05-21 SURGICAL SUPPLY — 25 items

## 2022-05-21 NOTE — Op Note (Signed)
[facility] Patient Name: Monica Solis Procedure Date: 05/21/2022 MRN: [id number] Attending MD: Thornton Park MD, MD Date of Birth: 1961/06/16 CSN: [id number] Age: 61 Admit Type: Outpatient Procedure:                Colonoscopy Indications:              Abdominal pain Providers:                Thornton Park MD, MD, Allayne Gitelman, RN, Darliss Cheney, Technician Referring MD:             Thornton Park MD, MD Medicines:                Monitored Anesthesia Care Complications:            No immediate complications. Estimated Blood Loss:     Estimated blood loss was minimal. Procedure:                Pre-Anesthesia Assessment:                           - Prior to the procedure, a History and Physical                            was performed, and patient medications and                            allergies were reviewed. The patient's tolerance of                            previous anesthesia was also reviewed. The risks                            and benefits of the procedure and the sedation                            options and risks were discussed with the patient.                            All questions were answered, and informed consent                            was obtained. Prior Anticoagulants: The patient has                            taken Xarelto (rivaroxaban), last dose was 2 days                            prior to procedure. ASA Grade Assessment: III - A                            patient with severe systemic disease. After  reviewing the risks and benefits, the patient was                            deemed in satisfactory condition to undergo the                            procedure.                           After obtaining informed consent, the colonoscope                            was passed under direct vision. Throughout the                            procedure, the patient's blood pressure,  pulse, and                            oxygen saturations were monitored continuously. The                            [id number]-[id number] ([id number]) Olympus colonoscope was                            introduced through the anus and advanced to the 3                            cm into the ileum. A second forward view of the                            right colon was performed. The colonoscopy was                            performed without difficulty. The patient tolerated                            the procedure well. The quality of the bowel                            preparation was good. The terminal ileum, ileocecal                            valve, appendiceal orifice, and rectum were                            photographed. Scope In: 7:53:22 AM Scope Out: 8:11:27 AM Scope Withdrawal Time: 0 hours 14 minutes 36 seconds  Total Procedure Duration: 0 hours 18 minutes 5 seconds  Findings:      The perianal and digital rectal examinations were normal.      Multiple small and large-mouthed diverticula were found in the sigmoid       colon.      A localized area of mildly altered vascular and erythematous mucosa was       found in the sigmoid colon. Biopsies were taken  with a cold forceps for       histology. Estimated blood loss was minimal.      Two sessile polyps were found in the ascending colon. The polyps were 4       to 8 mm in size. These polyps were removed with a cold snare. Resection       and retrieval were complete. Estimated blood loss was minimal.      The exam was otherwise without abnormality on direct and retroflexion       views. Impression:               - Diverticulosis in the sigmoid colon.                           - Altered vascular and erythematous mucosa in the                            sigmoid colon. Biopsied.                           - Two 4 to 8 mm polyps in the ascending colon,                            removed with a cold snare. Resected and retrieved.                            - The examination was otherwise normal on direct                            and retroflexion views. Moderate Sedation:      Not Applicable - Patient had care per Anesthesia. Recommendation:           - Patient has a contact number available for                            emergencies. The signs and symptoms of potential                            delayed complications were discussed with the                            patient. Return to normal activities tomorrow.                            Written discharge instructions were provided to the                            patient.                           - High fiber diet.                           - Continue present medications.                           - Await pathology results.                           -  Repeat colonoscopy date to be determined after                            pending pathology results are reviewed for                            surveillance.                           - Resume Xarelto (rivaroxaban) at prior dose in 2                            days.                           - Emerging evidence supports eating a diet of                            fruits, vegetables, grains, calcium, and yogurt                            while reducing red meat and alcohol may reduce the                            risk of colon cancer. Procedure Code(s):        --- Professional ---                           [phone], Colonoscopy, flexible; with removal of                            tumor(s), polyp(s), or other lesion(s) by snare                            technique                           45380, 44, Colonoscopy, flexible; with biopsy,                            single or multiple Diagnosis Code(s):        --- Professional ---                           K63.89, Other specified diseases of intestine                           K63.5, Polyp of colon                           R10.9, Unspecified abdominal pain                            K57.30, Diverticulosis of large intestine without                            perforation or abscess  without bleeding CPT copyright 2019 American Medical Association. All rights reserved. The codes documented in this report are preliminary and upon coder review may  be revised to meet current compliance requirements. Thornton Park MD, MD 05/21/2022 8:22:47 AM This report has been signed electronically. Number of Addenda: 0

## 2022-05-21 NOTE — Discharge Instructions (Signed)
YOU HAD AN ENDOSCOPIC PROCEDURE TODAY: Refer to the procedure report and other information in the discharge instructions given to you for any specific questions about what was found during the examination. If this information does not answer your questions, please call [place] office at [phone] to clarify.  ° °YOU SHOULD EXPECT: Some feelings of bloating in the abdomen. Passage of more gas than usual. Walking can help get rid of the air that was put into your GI tract during the procedure and reduce the bloating. If you had a lower endoscopy (such as a colonoscopy or flexible sigmoidoscopy) you may notice spotting of blood in your stool or on the toilet paper. Some abdominal soreness may be present for a day or two, also. ° °DIET: Your first meal following the procedure should be a light meal and then it is ok to progress to your normal diet. A half-sandwich or bowl of soup is an example of a good first meal. Heavy or fried foods are harder to digest and may make you feel nauseous or bloated. Drink plenty of fluids but you should avoid alcoholic beverages for 24 hours. If you had a esophageal dilation, please see attached instructions for diet.   ° °ACTIVITY: Your care partner should take you home directly after the procedure. You should plan to take it easy, moving slowly for the rest of the day. You can resume normal activity the day after the procedure however YOU SHOULD NOT DRIVE, use power tools, machinery or perform tasks that involve climbing or major physical exertion for 24 hours (because of the sedation medicines used during the test).  ° °SYMPTOMS TO REPORT IMMEDIATELY: °A gastroenterologist can be reached at any hour. Please call [phone]  for any of the following symptoms:  °Following lower endoscopy (colonoscopy, flexible sigmoidoscopy) °Excessive amounts of blood in the stool  °Significant tenderness, worsening of abdominal pains  °Swelling of the abdomen that is new, acute  °Fever of 100° or  higher  °Following upper endoscopy (EGD, EUS, ERCP, esophageal dilation) °Vomiting of blood or coffee ground material  °New, significant abdominal pain  °New, significant chest pain or pain under the shoulder blades  °Painful or persistently difficult swallowing  °New shortness of breath  °Black, tarry-looking or red, bloody stools ° °FOLLOW UP:  °If any biopsies were taken you will be contacted by phone or by letter within the next 1-3 weeks. Call [phone]  if you have not heard about the biopsies in 3 weeks.  °Please also call with any specific questions about appointments or follow up tests. ° °

## 2022-05-21 NOTE — H&P (Signed)
Referring Provider: Thornton Park, MD Primary Care Physician:  Reynold Bowen, MD  Indication for Upper Endoscopy:  Abdominal pain Indication for Colonoscopy:  Abdominal pain, altered bowel habits  IMPRESSION:  Right upper abdominal pain  Left lower abdominal pain Intermittent postprandial diarrhea Alternating diarrhea and constipation Extensive sigmoid diverticulosis with previous description of muscular hypertrophy on CT scan Prior colonoscopy 2014 History of ileitis on prior colonoscopy     - findings of procedure not available, only pathology result    - no recent small bowel abnormalities on prior CT scan Occasional NSAIDs used to treat abdominal pain Recent cholecystectomy for gallstones  PLAN: EGD with esophageal, gastric, and duodenal biopsies and Colonoscopy with evaluation of the TI   HPI: Monica Solis is a 61 y.o. female presents for endoscopic evaluation of abdominal pain.  She was first diagnosed with diverticulitis 3/13. Has had multiple episodes over the years requiring antibiotics.   Participated in Healthy Weight program but she was unable to tolerate the foods because it would trigger her left-sided abdominal pain that she initially thought might be related to her diverticulitis.    Ultimately found to have symptomatic gallstones. She had a cholecystectomy for gallstones at [facility] 05/2021.  She reports a weeklong hospitalization and had removal of 116 stones.  The recovery was difficult for her.    She has continued to have intermittent soreness in the RUQ following the surgery.    She now reports severe alternating days of diarrhea followed by days of constipation with associated abdominal discomfort with both RUQ pain and LLQ pain. There is frequent post-prandial diarrhea.  Moving to soft foods helps the symptoms. Using a heating pad and BenGay provides some relief.   Symptoms are effected by position. She has a difficulty finding a comfortable  position to lie in at night because of the discomfort. No association in symptoms with eating or defecation.    No blood in the stool. She may have some mucous. No weight loss.    Colonoscopy in 2014 in [facility].  The procedure note is not available but results in care everywhere show acute ileitis.   Mother required an ostomy related to diverticulitis. There is no known family history of colon cancer or polyps. No family history of stomach cancer or other GI malignancy. No family history of inflammatory bowel disease or celiac.    She is concerned that it could be her liver or pancreas.    Will only use ibuprofen with severe pain, otherwise she tries to use acetaminophen.   Prior abdominal imaging: - CT of the abdomen and pelvis with contrast 12/02/2015 for abdominal pain showed muscular hypertrophy from chronic diverticulosis with extensive sigmoid diverticulosis - Upper GI series 10/18/2016 during consideration for bariatric surgery: Normal   Past Medical History:  Diagnosis Date   Abnormal uterine bleeding (AUB)    AICD (automatic cardioverter/defibrillator) present    At risk for sleep apnea    STOP-BANG=  5       SENT TO PCP 10-01-2016   Bilateral swelling of feet    Blood clot in vein    "superficial blood clots in BLE; I had 6-7 since 2000" (06/26/2017)   BMI 45.0-49.9, adult (HCC)    Chewing difficulty    CHF (congestive heart failure) (HCC)    Chronic bronchitis (HCC)    Constipation    Diverticulosis of colon    Edema    Endometrial polyp    Factor 5 Leiden mutation, heterozygous ([place])  dx 2011   GERD (gastroesophageal reflux disease)    High cholesterol    History of DVT of lower extremity 2000; 2011   "I've had them in both"   History of ectopic pregnancy 1998   Hyperlipidemia    Hypertension    Hypothyroidism    Left bundle branch block (LBBB)    Non-ischemic cardiomyopathy ([place])    Osteoarthritis    "all over" (06/26/2017)   Other fatigue    Peripheral  neuropathy    PONV (postoperative nausea and vomiting)    Shortness of breath    Shortness of breath on exertion    Sleep apnea    no CPAP   Type 2 diabetes mellitus treated with insulin ([place])    Vitamin D deficiency     Past Surgical History:  Procedure Laterality Date   BI-VENTRICULAR IMPLANTABLE CARDIOVERTER DEFIBRILLATOR  (CRT-D)  06/26/2017   BIV ICD INSERTION CRT-D N/A 06/26/2017   Procedure: BIV ICD INSERTION CRT-D;  Surgeon: Evans Lance, MD;  Location: [place] [facility];  Service: Cardiovascular;  Laterality: N/A;   CHOLECYSTECTOMY N/A 06/02/2021   COLONOSCOPY  2013   DILATATION & CURETTAGE/HYSTEROSCOPY WITH MYOSURE N/A 10/05/2016   Procedure: [place];  Surgeon: Anastasio Auerbach, MD;  Location: [place];  Service: Gynecology;  Laterality: N/A;  requests to follow 7:30am caserequests one hour OR time   DILATION AND CURETTAGE OF UTERUS  12/06/2007   LAPAROSCOPY BILATERAL TUBAL LIGATION WITH FALOPE RINGS/  D&C HYSTEROSCOPY WITH POLYPECTOMY  12/06/2007   and [place] Adhesions   LEFT HEART CATH AND CORONARY ANGIOGRAPHY N/A 01/03/2017   Procedure: Left Heart Cath and Coronary Angiography;  Surgeon: Lorretta Harp, MD;  Location: [place] [facility];  Service: Cardiovascular;  Laterality: N/A;   TUBAL LIGATION  12/06/2007    Current Facility-Administered Medications  Medication Dose Route Frequency Provider Last Rate Last Admin   0.9 %  sodium chloride infusion   Intravenous Continuous Thornton Park, MD       Facility-Administered Medications Ordered in Other Encounters  Medication Dose Route Frequency Provider Last Rate Last Admin   technetium tetrofosmin (TC-MYOVIEW) injection 62.7 millicurie  03.5 millicurie Intravenous Once PRN Pixie Casino, MD        Allergies as of 03/19/2022 - Review Complete 02/21/2022  Allergen Reaction Noted   Crestor [rosuvastatin calcium] Other (See Comments) 06/24/2017    Nexletol [bempedoic acid]  12/27/2020   Other Swelling 01/01/2017   Benadryl [diphenhydramine hcl] Hives 07/10/2011   Canagliflozin Other (See Comments) 12/15/2015   Metformin Other (See Comments) 05/29/2016   Pravastatin Rash and Other (See Comments) 05/04/2013   Tramadol Rash and Other (See Comments) 03/26/2014    Family History  Problem Relation Age of Onset   Hypertension Mother    Heart disease Mother    Alzheimer's disease Mother    Atrial fibrillation Mother    Diabetes Mother    Hyperlipidemia Mother    Stroke Mother    Anxiety disorder Mother    Sleep apnea Mother    Obesity Mother    Heart disease Father    Sudden death Father    Mitral valve prolapse Sister    Breast cancer Maternal Aunt        30's   Alzheimer's disease Maternal Aunt    Diabetes Maternal Grandmother    Heart disease Maternal Grandmother    Cancer Maternal Grandfather        Unknown type   Heart  disease Paternal Grandmother    Heart disease Paternal Grandfather    Colon cancer Neg Hx    Liver disease Neg Hx    Pancreatic cancer Neg Hx    Esophageal cancer Neg Hx    Stomach cancer Neg Hx    Colon polyps Neg Hx    Rectal cancer Neg Hx      Physical Exam: General:   Alert,  well-nourished, pleasant and cooperative in NAD Head:  Normocephalic and atraumatic. Eyes:  Sclera clear, no icterus.   Conjunctiva pink. Mouth:  No deformity or lesions.   Neck:  Supple; no masses or thyromegaly. Lungs:  Clear throughout to auscultation.   No wheezes. Heart:  Regular rate and rhythm; no murmurs. Abdomen:  Soft, non-tender, nondistended, normal bowel sounds, no rebound or guarding.  Msk:  Symmetrical. No boney deformities LAD: No inguinal or umbilical LAD Extremities:  No clubbing or edema. Neurologic:  Alert and  oriented x4;  grossly nonfocal Skin:  No obvious rash or bruise. Psych:  Alert and cooperative. Normal mood and affect.     Studies/Results: No results found.    Garlin Batdorf L.  Tarri Glenn, MD, MPH 05/21/2022, 6:52 AM

## 2022-05-21 NOTE — Anesthesia Postprocedure Evaluation (Signed)
Anesthesia Post Note  Patient: [name]  Procedure(s) Performed: ESOPHAGOGASTRODUODENOSCOPY (EGD) WITH PROPOFOL COLONOSCOPY WITH PROPOFOL BIOPSY POLYPECTOMY     Patient location during evaluation: Endoscopy Anesthesia Type: MAC Level of consciousness: awake and alert Pain management: pain level controlled Vital Signs Assessment: post-procedure vital signs reviewed and stable Respiratory status: spontaneous breathing, nonlabored ventilation, respiratory function stable and patient connected to nasal cannula oxygen Cardiovascular status: blood pressure returned to baseline and stable Postop Assessment: no apparent nausea or vomiting Anesthetic complications: no   No notable events documented.  Last Vitals:  Vitals:   05/21/22 0830 05/21/22 0841  BP: (!) 142/55 (!) 161/62  Pulse: 64 61  Resp: 19 18  Temp:    SpO2: 98% 97%    Last Pain:  Vitals:   05/21/22 0841  TempSrc:   PainSc: 0-No pain                 Barnet Glasgow

## 2022-05-21 NOTE — Transfer of Care (Signed)
Immediate Anesthesia Transfer of Care Note  Patient: Pollie Poma  Procedure(s) Performed: ESOPHAGOGASTRODUODENOSCOPY (EGD) WITH PROPOFOL COLONOSCOPY WITH PROPOFOL BIOPSY POLYPECTOMY  Patient Location: Endoscopy Unit  Anesthesia Type:MAC  Level of Consciousness: awake, alert , oriented and patient cooperative  Airway & Oxygen Therapy: Patient Spontanous Breathing and Patient connected to face mask  Post-op Assessment: Report given to RN and Post -op Vital signs reviewed and stable  Post vital signs: Reviewed and stable  Last Vitals:  Vitals Value Taken Time  BP    Temp    Pulse 76 05/21/22 0819  Resp 22 05/21/22 0819  SpO2 96 % 05/21/22 0819  Vitals shown include unvalidated device data.  Last Pain:  Vitals:   05/21/22 0705  TempSrc: Temporal  PainSc: 0-No pain         Complications: No notable events documented.

## 2022-05-21 NOTE — Op Note (Signed)
[facility] Patient Name: Monica Solis Procedure Date: 05/21/2022 MRN: [id number] Attending MD: Thornton Park MD, MD Date of Birth: 1960-12-01 CSN: [id number] Age: 61 Admit Type: Outpatient Procedure:                Upper GI endoscopy Indications:              Abdominal pain Providers:                Thornton Park MD, MD, Allayne Gitelman, RN, Darliss Cheney, Technician Referring MD:             Thornton Park MD, MD Medicines:                Monitored Anesthesia Care Complications:            No immediate complications. Estimated Blood Loss:     Estimated blood loss was minimal. Procedure:                Pre-Anesthesia Assessment:                           - Prior to the procedure, a History and Physical                            was performed, and patient medications and                            allergies were reviewed. The patient's tolerance of                            previous anesthesia was also reviewed. The risks                            and benefits of the procedure and the sedation                            options and risks were discussed with the patient.                            All questions were answered, and informed consent                            was obtained. Prior Anticoagulants: The patient has                            taken Xarelto (rivaroxaban), last dose was 2 days                            prior to procedure. ASA Grade Assessment: III - A                            patient with severe systemic disease. After  reviewing the risks and benefits, the patient was                            deemed in satisfactory condition to undergo the                            procedure.                           After obtaining informed consent, the endoscope was                            passed under direct vision. Throughout the                            procedure, the patient's blood  pressure, pulse, and                            oxygen saturations were monitored continuously. The                            [id number]-H190 ([id number]) Olympus endoscope was introduced                            through the mouth, and advanced to the third part                            of duodenum. The upper GI endoscopy was                            accomplished without difficulty. The patient                            tolerated the procedure well. Scope In: Scope Out: Findings:      The stomach was normal. The z-line is located 40 cm from the incisors.      The entire examined stomach was normal. Biopsies were taken from the       antrum, body, and fundus with a cold forceps for histology. Estimated       blood loss was minimal.      Multiple small sessile polyps were found in the gastric fundus and in       the gastric body. Biopsies were taken with a cold forceps for histology.       Estimated blood loss was minimal.      The examined duodenum was normal. Biopsies were taken with a cold       forceps for histology. Estimated blood loss was minimal.      The cardia and gastric fundus were normal on retroflexion.      The exam was otherwise without abnormality. Impression:               - Normal stomach.                           - Normal stomach. Biopsied.                           -  Multiple gastric polyps. Biopsied.                           - Normal examined duodenum. Biopsied.                           - The examination was otherwise normal. Moderate Sedation:      Not Applicable - Patient had care per Anesthesia. Recommendation:           - Patient has a contact number available for                            emergencies. The signs and symptoms of potential                            delayed complications were discussed with the                            patient. Return to normal activities tomorrow.                            Written discharge instructions were provided to the                             patient.                           - Resume previous diet.                           - Continue present medications.                           - Await pathology results. Procedure Code(s):        --- Professional ---                           [phone], Esophagogastroduodenoscopy, flexible,                            transoral; with biopsy, single or multiple Diagnosis Code(s):        --- Professional ---                           K31.7, Polyp of stomach and duodenum                           R10.9, Unspecified abdominal pain CPT copyright 2019 American Medical Association. All rights reserved. The codes documented in this report are preliminary and upon coder review may  be revised to meet current compliance requirements. Thornton Park MD, MD 05/21/2022 7:51:27 AM This report has been signed electronically. Number of Addenda: 0

## 2022-05-22 ENCOUNTER — Encounter (HOSPITAL_COMMUNITY): Payer: Self-pay | Admitting: Gastroenterology

## 2022-05-22 LAB — SURGICAL PATHOLOGY

## 2022-05-23 ENCOUNTER — Encounter (INDEPENDENT_AMBULATORY_CARE_PROVIDER_SITE_OTHER): Payer: Self-pay

## 2022-06-04 ENCOUNTER — Ambulatory Visit (INDEPENDENT_AMBULATORY_CARE_PROVIDER_SITE_OTHER): Payer: 59

## 2022-06-04 DIAGNOSIS — I5022 Chronic systolic (congestive) heart failure: Secondary | ICD-10-CM

## 2022-06-04 DIAGNOSIS — Z95810 Presence of automatic (implantable) cardiac defibrillator: Secondary | ICD-10-CM | POA: Diagnosis not present

## 2022-06-05 ENCOUNTER — Other Ambulatory Visit: Payer: Self-pay

## 2022-06-05 DIAGNOSIS — K529 Noninfective gastroenteritis and colitis, unspecified: Secondary | ICD-10-CM

## 2022-06-05 MED ORDER — MESALAMINE 1.2 G PO TBEC: 2.4000 g | DELAYED_RELEASE_TABLET | Freq: Every day | ORAL | 0 refills | Status: DC

## 2022-06-08 NOTE — Progress Notes (Signed)
EPIC Encounter for ICM Monitoring  Patient Name: Monica Solis is a 61 y.o. female Date: 06/08/2022 Primary Care Physican: Reynold Bowen, MD Primary Cardiologist: Gwenlyn Found Electrophysiologist: Lovena Le  02/21/2022 Weight: 330 - 335 lbs   Spoke with patient and heart failure questions reviewed.  Pt asymptomatic for fluid accumulation.  Colonoscopy resulted in cancer polyps but all of them were removed.  Also endo showed some stomach inflammation and possible Crohns but she needs more lab work for physician to make dx.    HeartLogic HF Index is 1 suggesting fluid levels are within normal threshold range.     Prescribed: Hydrochlorothiazide 12.5 mg Take 12.5 mg by mouth daily as needed (fluid).    Labs: 12/27/2020 Creatinine 0.85, BUN 13, Potassium 4.5, Sodium 141, GFR 79 A complete set of results can be found in Results Review.   Recommendations:  No changes and encouraged to call if experiencing any fluid symptoms.   Follow-up plan: ICM clinic phone appointment on 07/09/2022.   91 day device clinic remote transmission 07/04/2022.              EP/Cardiology next office visit:  Recall 07/29/2022 with Oda Kilts, PA or Tommye Standard, [place].  Recall 12/10/2022 with Dr Gwenlyn Found.          Copy of ICM check sent to Dr. Lovena Le.  3 Month Trend    8 Day Data Trend          [name], RN 06/08/2022 2:42 PM

## 2022-06-12 ENCOUNTER — Other Ambulatory Visit (INDEPENDENT_AMBULATORY_CARE_PROVIDER_SITE_OTHER): Payer: 59

## 2022-06-12 DIAGNOSIS — K529 Noninfective gastroenteritis and colitis, unspecified: Secondary | ICD-10-CM | POA: Diagnosis not present

## 2022-06-12 LAB — CBC WITH DIFFERENTIAL/PLATELET
Basophils Absolute: 0 10*3/uL (ref 0.0–0.1)
Basophils Relative: 0.7 % (ref 0.0–3.0)
Eosinophils Absolute: 0.1 10*3/uL (ref 0.0–0.7)
Eosinophils Relative: 1.1 % (ref 0.0–5.0)
HCT: 45.3 % (ref 36.0–46.0)
Hemoglobin: 15.2 g/dL — ABNORMAL HIGH (ref 12.0–15.0)
Lymphocytes Relative: 26.6 % (ref 12.0–46.0)
Lymphs Abs: 1.5 10*3/uL (ref 0.7–4.0)
MCHC: 33.4 g/dL (ref 30.0–36.0)
MCV: 92 fl (ref 78.0–100.0)
Monocytes Absolute: 0.5 10*3/uL (ref 0.1–1.0)
Monocytes Relative: 9 % (ref 3.0–12.0)
Neutro Abs: 3.6 10*3/uL (ref 1.4–7.7)
Neutrophils Relative %: 62.6 % (ref 43.0–77.0)
Platelets: 244 10*3/uL (ref 150.0–400.0)
RBC: 4.92 Mil/uL (ref 3.87–5.11)
RDW: 13.3 % (ref 11.5–15.5)
WBC: 5.8 10*3/uL (ref 4.0–10.5)

## 2022-06-12 LAB — C-REACTIVE PROTEIN: CRP: 1.2 mg/dL (ref 0.5–20.0)

## 2022-06-15 ENCOUNTER — Other Ambulatory Visit: Payer: 59

## 2022-06-15 DIAGNOSIS — K529 Noninfective gastroenteritis and colitis, unspecified: Secondary | ICD-10-CM

## 2022-06-21 LAB — CALPROTECTIN, FECAL: Calprotectin, Fecal: 39 ug/g (ref 0–120)

## 2022-06-28 ENCOUNTER — Other Ambulatory Visit: Payer: Self-pay | Admitting: Gastroenterology

## 2022-07-04 ENCOUNTER — Ambulatory Visit (INDEPENDENT_AMBULATORY_CARE_PROVIDER_SITE_OTHER): Payer: 59

## 2022-07-04 DIAGNOSIS — I428 Other cardiomyopathies: Secondary | ICD-10-CM | POA: Diagnosis not present

## 2022-07-04 LAB — CUP PACEART REMOTE DEVICE CHECK
Battery Remaining Longevity: 90 mo
Battery Remaining Percentage: 100 %
Brady Statistic RA Percent Paced: 0 %
Brady Statistic RV Percent Paced: 0 %
Date Time Interrogation Session: 20230920005100
HighPow Impedance: 76 Ohm
Implantable Lead Implant Date: 20180912
Implantable Lead Implant Date: 20180912
Implantable Lead Implant Date: 20180912
Implantable Lead Location: 753858
Implantable Lead Location: 753859
Implantable Lead Location: 753860
Implantable Lead Model: 293
Implantable Lead Model: 4674
Implantable Lead Model: 7741
Implantable Lead Serial Number: 435131
Implantable Lead Serial Number: 813037
Implantable Lead Serial Number: 920623
Implantable Pulse Generator Implant Date: 20180912
Lead Channel Impedance Value: 561 Ohm
Lead Channel Impedance Value: 632 Ohm
Lead Channel Impedance Value: 743 Ohm
Lead Channel Setting Pacing Amplitude: 2 V
Lead Channel Setting Pacing Amplitude: 2.5 V
Lead Channel Setting Pacing Amplitude: 2.5 V
Lead Channel Setting Pacing Pulse Width: 0.4 ms
Lead Channel Setting Pacing Pulse Width: 0.4 ms
Lead Channel Setting Sensing Sensitivity: 0.5 mV
Lead Channel Setting Sensing Sensitivity: 1 mV
Pulse Gen Serial Number: 193993

## 2022-07-04 NOTE — Progress Notes (Signed)
07/09/2022 Monica Solis [id number] [birth year]  Referring provider: Reynold Bowen, MD Primary GI doctor: Dr. Tarri Glenn  ASSESSMENT AND PLAN:   Assessment: 61 y.o. female here for assessment of the following: 1. Colitis   2. LLQ pain   3. Adenomatous polyp of ascending colon   4. Diverticulosis of colon     Plan: Patient with history of acitve colitis on recent colonoscopy, with history of diverticulosis, most likely this represents segmental colitis associated with diverticulitis with associated intermittent diarrhea, lower abdominal cramping. 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 -will do trial of increase to mesalamine 4.8 mg daily for a month to see if this helps - continue dicyclomine and take before food up to TID -If relapse will repeat antibiotic for 1 month with long-term Cipro. - FODMAP diet given, avoid lactulose - follow up in 3 months - recall colon 05/2029  Meds ordered this encounter  Medications   mesalamine (LIALDA) 1.2 g EC tablet    Sig: Take 2 tablets (2.4 g total) by mouth 2 (two) times daily.    Dispense:  360 tablet    Refill:  0    History of Present Illness:  61 y.o. 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  05/21/2022 colonoscopy and endoscopy with Dr. Tarri Glenn For abdominal pain, diverticulosis, altered vascular erythematous mucosa in sigmoid colon, 2 adenomatous polyps 4 to 8 mm ascending colon high-grade dysplasia-recall 7 years. Showed multiple gastric polyps, otherwise unremarkable' \\Duodenal'$  biopsy negative for celiac, negative H.  pylori gastric polyps fundic gland polyps. Sigmoid biopsies did show erosion with mild active inflammation, differential includes diverticulitis related colitis.  Patient was started on trial of Lialda 2.4 g daily and dicyclomine. She continues to have discomfort with spasms, had episode after raisin bran cereal with AB pain for 4 days.  Can have nausea, diarrhea and AB pain with it. No fever or chills.  Does not tolerate wheat or beans, states has some minor milk in products Patient had unremarkable CRP and fecal calprotectin. CBC without anemia or leukocytosis.  No family history of autoimmune disease.  Mom with history of diverticulitis with perforation and colostomy bag.   She  reports that she quit smoking about 42 years ago. Her smoking use included cigarettes. She has a 0.20 pack-year smoking history. She has been exposed to tobacco smoke. She has never used smokeless tobacco. She reports that she does not drink alcohol and does not use drugs. Her family history includes Alzheimer's disease in her maternal aunt and mother; Anxiety disorder in her mother; Atrial fibrillation in her mother; Breast cancer in her maternal aunt; Cancer in her maternal grandfather; Diabetes in her maternal grandmother and mother; Heart disease in her father, maternal grandmother, mother, paternal grandfather, and paternal grandmother; Hyperlipidemia in her mother; Hypertension in her mother; Mitral valve prolapse in her sister; Obesity in her mother; Sleep apnea in her mother; Stroke in her mother; Sudden death in her father.   Current Medications:   Current Outpatient Medications (Endocrine & Metabolic):    insulin aspart (NOVOLOG) 100 UNIT/ML injection, Inject 7-10 Units into the skin 3 (three) times daily as needed for high blood sugar (  Per SSC 7-10units when cbg above 120).   insulin detemir (LEVEMIR) 100 UNIT/ML injection, Inject 40 Units into the skin at bedtime.    levothyroxine (SYNTHROID) 88 MCG  tablet, Take 88 mcg by mouth daily.   Current Outpatient Medications (Cardiovascular):    carvedilol (COREG) 3.125 MG tablet, TAKE 1 TABLET BY MOUTH 2 TIMES DAILY.   hydrochlorothiazide (HYDRODIURIL) 12.5 MG tablet, TAKE 1 TABLET BY MOUTH DAILY AS NEEDED. (Patient taking differently: Take 12.5 mg by mouth daily.)   sacubitril-valsartan (ENTRESTO) 24-26 MG, TAKE 1 TABLET BY MOUTH TWICE A DAY   Current Outpatient Medications (Respiratory):    fexofenadine (ALLEGRA) 180 MG tablet, Take 180 mg by mouth daily as needed for allergies or rhinitis.     Current Outpatient Medications (Hematological):    XARELTO 20 MG TABS tablet, Take 1 tablet (20 mg total) by mouth daily. (Patient taking differently: Take 20 mg by mouth daily with supper. Managed by Dr. Forde Dandy)   Current Outpatient Medications (Other):    Cholecalciferol (VITAMIN D3) 2000 UNITS capsule, Take 4,000 Units by mouth daily with supper.   dicyclomine (BENTYL) 10 MG capsule, TAKE 1 CAPSULE (10 MG TOTAL) BY MOUTH 3 (THREE) TIMES DAILY BEFORE MEALS.   esomeprazole (NEXIUM) 40 MG capsule, Take 40 mg by mouth daily.   gabapentin (NEURONTIN) 100 MG capsule, Take 100-200 mg by mouth See admin instructions. Take 100 mg in the morning and 200 mg at bedtime   Menthol, Topical Analgesic, (BENGAY EX), Apply 1 application topically 2 (two) times daily as needed (pain).   Multiple Vitamin (MULTIVITAMIN) tablet, Take 1 tablet by mouth daily. Geritol   mesalamine (LIALDA) 1.2 g EC tablet, Take 2 tablets (2.4 g total) by mouth 2 (two) times daily.   Facility-Administered Medications Ordered in Other Visits (Other):    technetium tetrofosmin (TC-MYOVIEW) injection 40.9 millicurie No current facility-administered medications for this visit.  Surgical History:  She  has a past surgical history that includes LAPAROSCOPY BILATERAL TUBAL LIGATION WITH FALOPE RINGS/  D&C HYSTEROSCOPY WITH POLYPECTOMY (12/06/2007); Colonoscopy (2013); Dilatation &  curettage/hysteroscopy with myosure (N/A, 10/05/2016); LEFT HEART CATH AND CORONARY ANGIOGRAPHY (N/A, 01/03/2017); Bi-ventricular implantable cardioverter defibrillator  (crt-d) (06/26/2017); Dilation and curettage of uterus (12/06/2007); Tubal ligation (12/06/2007); BIV ICD INSERTION CRT-D (N/A, 06/26/2017); Cholecystectomy (N/A, 06/02/2021); Esophagogastroduodenoscopy (egd) with propofol (N/A, 05/21/2022); Colonoscopy with propofol (N/A, 05/21/2022); biopsy (05/21/2022); and polypectomy (05/21/2022).  Current Medications, Allergies, Past Medical History, Past Surgical History, Family History and Social History were reviewed in [employer] record.  Physical Exam: BP 110/70   Pulse 77   Ht '5\' 9"'$  (1.753 m)   Wt (!) 338 lb (153.3 kg)   LMP 08/07/2016   BMI 49.91 kg/m  General:   Pleasant,  obese female in no acute distress Heart : Regular rate and rhythm; no murmurs Pulm: Clear anteriorly; no wheezing Abdomen:  Soft, Obese AB, Active bowel sounds. mild tenderness in the LLQ. Without guarding and Without rebound, No organomegaly appreciated. Rectal: Not evaluated Extremities:  with  edema. Neurologic:  Alert and  oriented x4;  No focal deficits.  Psych:  Cooperative. Normal mood and affect.   Vladimir Crofts, PA-C 07/09/22

## 2022-07-09 ENCOUNTER — Ambulatory Visit (INDEPENDENT_AMBULATORY_CARE_PROVIDER_SITE_OTHER): Payer: 59

## 2022-07-09 ENCOUNTER — Ambulatory Visit (INDEPENDENT_AMBULATORY_CARE_PROVIDER_SITE_OTHER): Payer: 59 | Admitting: Physician Assistant

## 2022-07-09 ENCOUNTER — Encounter: Payer: Self-pay | Admitting: Physician Assistant

## 2022-07-09 VITALS — BP 110/70 | HR 77 | Ht 69.0 in | Wt 338.0 lb

## 2022-07-09 DIAGNOSIS — Z95810 Presence of automatic (implantable) cardiac defibrillator: Secondary | ICD-10-CM | POA: Diagnosis not present

## 2022-07-09 DIAGNOSIS — I5022 Chronic systolic (congestive) heart failure: Secondary | ICD-10-CM

## 2022-07-09 DIAGNOSIS — K529 Noninfective gastroenteritis and colitis, unspecified: Secondary | ICD-10-CM

## 2022-07-09 DIAGNOSIS — D122 Benign neoplasm of ascending colon: Secondary | ICD-10-CM | POA: Diagnosis not present

## 2022-07-09 DIAGNOSIS — K5730 Diverticulosis of large intestine without perforation or abscess without bleeding: Secondary | ICD-10-CM

## 2022-07-09 DIAGNOSIS — R1032 Left lower quadrant pain: Secondary | ICD-10-CM

## 2022-07-09 MED ORDER — MESALAMINE 1.2 G PO TBEC: 2.4000 g | DELAYED_RELEASE_TABLET | Freq: Two times a day (BID) | ORAL | 0 refills | Status: DC

## 2022-07-09 NOTE — Patient Instructions (Addendum)
Follow up with Dr. Tarri Glenn in 3 months.  We have sent the following medications to your pharmacy for you to pick up at your convenience: Lialda  Miralax is an osmotic laxative.  It only brings more water into the stool.  This is safe to take daily.  Can take up to 17 gram of miralax twice a day.  Mix with juice or coffee.  Start 1 capful at night for 3-4 days and reassess your response in 3-4 days.  You can increase and decrease the dose based on your response.  Remember, it can take up to 3-4 days to take effect OR for the effects to wear off.   I often pair this with benefiber in the morning to help assure the stool is not too loose.    Diverticulosis Diverticulosis is a condition that develops when small pouches (diverticula) form in the wall of the large intestine (colon). The colon is where water is absorbed and stool (feces) is formed. The pouches form when the inside layer of the colon pushes through weak spots in the outer layers of the colon. You may have a few pouches or many of them. The pouches usually do not cause problems unless they become inflamed or infected. When this happens, the condition is called diverticulitis- this is left lower quadrant pain, diarrhea, fever, chills, nausea or vomiting.  If this occurs please call the office or go to the hospital. Sometimes these patches without inflammation can also have painless bleeding associated with them, if this happens please call the office or go to the hospital. Preventing constipation and increasing fiber can help reduce diverticula and prevent complications. Even if you feel you have a high-fiber diet, suggest getting on Benefiber or Cirtracel 2 times daily.    FODMAP stands for fermentable oligo-, di-, mono-saccharides and polyols (1). These are the scientific terms used to classify groups of carbs that are notorious for triggering digestive symptoms like bloating, gas and stomach pain.

## 2022-07-09 NOTE — Progress Notes (Signed)
Reviewed and agree with management plans. ? ?Monica Solis L. Monica Calbert, MD, MPH  ?

## 2022-07-11 NOTE — Progress Notes (Signed)
EPIC Encounter for ICM Monitoring  Patient Name: Monica Solis is a 61 y.o. female Date: 07/11/2022 Primary Care Physican: Reynold Bowen, MD Primary Cardiologist: Gwenlyn Found Electrophysiologist: Lovena Le  02/21/2022 Weight: 330 - 335 lbs 07/11/2022 Weight: 342 lbs   Spoke with patient and heart failure questions reviewed.  Pt asymptomatic for fluid accumulation.  She reports she has Colitis.     HeartLogic HF Index is 4 suggesting fluid levels are within normal threshold range.     Prescribed: Hydrochlorothiazide 12.5 mg Take 12.5 mg by mouth daily as needed (fluid).    Labs: 12/27/2020 Creatinine 0.85, BUN 13, Potassium 4.5, Sodium 141, GFR 79 A complete set of results can be found in Results Review.   Recommendations:  No changes and encouraged to call if experiencing any fluid symptoms.   Follow-up plan: ICM clinic phone appointment on 08/13/2022.   91 day device clinic remote transmission 10/03/2022.              EP/Cardiology next office visit: 07/30/2022 Tommye Standard, PA.  Recall 12/10/2022 with Dr Gwenlyn Found.          Copy of ICM check sent to Dr. Lovena Le.   3 month ICM trend: 07/08/2022.    8 day trend:     [name], RN 07/11/2022 3:11 PM

## 2022-07-17 NOTE — Progress Notes (Signed)
Remote ICD transmission.   

## 2022-07-29 NOTE — Progress Notes (Signed)
Cardiology Office Note Date:  07/30/2022  Patient ID:  Armari, Beckel 1960/12/12, MRN [id number] PCP:  Adrian Prince, MD  Cardiologist:  Dr. Allyson Sabal Electrophysiologist: Dr. Ladona Ridgel    Chief Complaint:    annual EP  visit  History of Present Illness: Monica Solis is a 61 y.o. female with history of Factor V Leiden deficiency, h/o DVT/PE (2011), NICM, chronic CHF (systolic), CRT-D, normalization of her LVEF, HTN, HLD, DM, obesity, OSA (intolerant of CPAP).  I saw her 07/2020 She is doing OK, in the last year or so, notes whne sheis bent over she gets very lightheaded and has to sit back up to get it to resolve, she has not fainted.  Gives an example when bent over working in the garden or dusting low things. None otherwise. No syncope or shocks No CP, palpitations or cardiac awareness Denies SOB. No bleeding or signs of bleeding. She has an annual visit with her PMD Nov 11, planned for "full labs". No changes were made  Saw Dr. Ladona Ridgel 08/01/21, reported doing well, BP occasionally gets low, trying to lose weight though frustrated over lack of progress. No changes were made, recommended management of meds with dr. Allyson Sabal  She has seen Dr. Allyson Sabal and team a couple times since then, most recently E. Monge, NP on 05/08/22, via telehealth for pre-endoscopy , no reported changes since her last visit with D. Allyson Sabal, some orthostatic dizziness mentioned, not new.  Felt an acceptable cardiac risk for planned procedures. Xarelto deferred to her managing MD, given on it for factor V Leiden mutation and hx of DVT/PE  TODAY She is doing well. NO changes in exertional capacity Able to do all of her ADLs without difficulty No CP, palpitations or cardiac awareness No near syncope or syncope. No bleeding or signs of bleeding  They found polyps that were removed at her colonoscopy said to be cancerous but all removed with no further treatment needed.  Labs are done with her PMD   Device  information BSci CRT-D, implanted, 06/26/17   Past Medical History:  Diagnosis Date   Abnormal uterine bleeding (AUB)    AICD (automatic cardioverter/defibrillator) present    At risk for sleep apnea    STOP-BANG=  5       SENT TO PCP 10-01-2016   Bilateral swelling of feet    Blood clot in vein    "superficial blood clots in BLE; I had 6-7 since 2000" (06/26/2017)   BMI 45.0-49.9, adult (HCC)    Chewing difficulty    CHF (congestive heart failure) (HCC)    Chronic bronchitis (HCC)    Colitis    Constipation    Diverticulosis of colon    Edema    Endometrial polyp    Factor 5 Leiden mutation, heterozygous (HCC)    dx 2011   GERD (gastroesophageal reflux disease)    High cholesterol    History of DVT of lower extremity 2000; 2011   "I've had them in both"   History of ectopic pregnancy 1998   Hyperlipidemia    Hypertension    Hypothyroidism    Left bundle branch block (LBBB)    Non-ischemic cardiomyopathy (HCC)    Osteoarthritis    "all over" (06/26/2017)   Other fatigue    Peripheral neuropathy    PONV (postoperative nausea and vomiting)    Shortness of breath    Shortness of breath on exertion    Sleep apnea    no CPAP   Type 2 diabetes  mellitus treated with insulin (HCC)    Vitamin D deficiency     Past Surgical History:  Procedure Laterality Date   BI-VENTRICULAR IMPLANTABLE CARDIOVERTER DEFIBRILLATOR  (CRT-D)  06/26/2017   BIOPSY  05/21/2022   Procedure: BIOPSY;  Surgeon: Tressia Danas, MD;  Location: [facility];  Service: Gastroenterology;;  EGD and COLON   BIV ICD INSERTION CRT-D N/A 06/26/2017   Procedure: BIV ICD INSERTION CRT-D;  Surgeon: Marinus Maw, MD;  Location: [facility] INVASIVE CV LAB;  Service: Cardiovascular;  Laterality: N/A;   CHOLECYSTECTOMY N/A 06/02/2021   COLONOSCOPY  2013   COLONOSCOPY WITH PROPOFOL N/A 05/21/2022   Procedure: COLONOSCOPY WITH PROPOFOL;  Surgeon: Tressia Danas, MD;  Location: [facility];  Service: Gastroenterology;   Laterality: N/A;   DILATATION & CURETTAGE/HYSTEROSCOPY WITH MYOSURE N/A 10/05/2016   Procedure: DILATATION & CURETTAGE/HYSTEROSCOPY WITH MYOSURE;  Surgeon: Dara Lords, MD;  Location: [facility];  Service: Gynecology;  Laterality: N/A;  requests to follow 7:30am caserequests one hour OR time   DILATION AND CURETTAGE OF UTERUS  12/06/2007   ESOPHAGOGASTRODUODENOSCOPY (EGD) WITH PROPOFOL N/A 05/21/2022   Procedure: ESOPHAGOGASTRODUODENOSCOPY (EGD) WITH PROPOFOL;  Surgeon: Tressia Danas, MD;  Location: [facility];  Service: Gastroenterology;  Laterality: N/A;   LAPAROSCOPY BILATERAL TUBAL LIGATION WITH FALOPE RINGS/  D&C HYSTEROSCOPY WITH POLYPECTOMY  12/06/2007   and Lysis Adhesions   LEFT HEART CATH AND CORONARY ANGIOGRAPHY N/A 01/03/2017   Procedure: Left Heart Cath and Coronary Angiography;  Surgeon: Runell Gess, MD;  Location: [facility] INVASIVE CV LAB;  Service: Cardiovascular;  Laterality: N/A;   POLYPECTOMY  05/21/2022   Procedure: POLYPECTOMY;  Surgeon: Tressia Danas, MD;  Location: [facility];  Service: Gastroenterology;;   TUBAL LIGATION  12/06/2007    Current Outpatient Medications  Medication Sig Dispense Refill   carvedilol (COREG) 3.125 MG tablet TAKE 1 TABLET BY MOUTH 2 TIMES DAILY. 180 tablet 6   Cholecalciferol (VITAMIN D3) 2000 UNITS capsule Take 4,000 Units by mouth daily with supper.     dicyclomine (BENTYL) 10 MG capsule TAKE 1 CAPSULE (10 MG TOTAL) BY MOUTH 3 (THREE) TIMES DAILY BEFORE MEALS. 270 capsule 1   esomeprazole (NEXIUM) 40 MG capsule Take 40 mg by mouth daily.  5   fexofenadine (ALLEGRA) 180 MG tablet Take 180 mg by mouth daily as needed for allergies or rhinitis.     gabapentin (NEURONTIN) 100 MG capsule Take 100 mg in the morning and 300 mg at bedtime     hydrochlorothiazide (HYDRODIURIL) 12.5 MG tablet TAKE 1 TABLET BY MOUTH DAILY AS NEEDED. 90 tablet 3   insulin aspart (NOVOLOG) 100 UNIT/ML injection Inject 7-10 Units into the  skin 3 (three) times daily as needed for high blood sugar (Per SSC 7-10units when cbg above 120).     insulin detemir (LEVEMIR) 100 UNIT/ML injection Inject 40 Units into the skin at bedtime.      levothyroxine (SYNTHROID) 88 MCG tablet Take 88 mcg by mouth daily.     Menthol, Topical Analgesic, (BENGAY EX) Apply 1 application topically 2 (two) times daily as needed (pain).     mesalamine (LIALDA) 1.2 g EC tablet Take 2 tablets (2.4 g total) by mouth 2 (two) times daily. 360 tablet 0   Multiple Vitamin (MULTIVITAMIN) tablet Take 1 tablet by mouth daily. Geritol     sacubitril-valsartan (ENTRESTO) 24-26 MG TAKE 1 TABLET BY MOUTH TWICE A DAY 180 tablet 6   XARELTO 20 MG TABS tablet Take 1 tablet (20 mg total) by  mouth daily. 90 tablet 3   No current facility-administered medications for this visit.   Facility-Administered Medications Ordered in Other Visits  Medication Dose Route Frequency Provider Last Rate Last Admin   technetium tetrofosmin (TC-MYOVIEW) injection 32.4 millicurie  32.4 millicurie Intravenous Once PRN Hilty, Lisette Abu, MD        Allergies:   Crestor [rosuvastatin calcium], Nexletol [bempedoic acid], Other, Benadryl [diphenhydramine hcl], Canagliflozin, Metformin, Pravastatin, Tramadol, and Victoza [liraglutide]   Social History:  The patient  reports that she quit smoking about 42 years ago. Her smoking use included cigarettes. She has a 0.20 pack-year smoking history. She has been exposed to tobacco smoke. She has never used smokeless tobacco. She reports that she does not drink alcohol and does not use drugs.   Family History:  The patient's family history includes Alzheimer's disease in her maternal aunt and mother; Anxiety disorder in her mother; Atrial fibrillation in her mother; Breast cancer in her maternal aunt; Cancer in her maternal grandfather; Diabetes in her maternal grandmother and mother; Heart disease in her father, maternal grandmother, mother, paternal  grandfather, and paternal grandmother; Hyperlipidemia in her mother; Hypertension in her mother; Mitral valve prolapse in her sister; Obesity in her mother; Sleep apnea in her mother; Stroke in her mother; Sudden death in her father.  ROS:  Please see the history of present illness.  All other systems are reviewed and otherwise negative.   PHYSICAL EXAM:  VS:  BP (!) 152/80   Pulse 67   Ht 5\' 9"  (1.753 m)   Wt (!) 344 lb (156 kg)   LMP 08/07/2016   SpO2 97%   BMI 50.80 kg/m  BMI: Body mass index is 50.8 kg/m. Well nourished, well developed, in no acute distress  HEENT: normocephalic, atraumatic  Neck: no JVD, carotid bruits or masses Cardiac:  RRR; no significant murmurs, no rubs, or gallops Lungs:   CTA b/l, no wheezing, rhonchi or rales  Abd: soft, nontender, obese MS: no deformity or atrophy Ext: no edema Skin: warm and dry, no rash Neuro:  No gross deficits appreciated Psych: euthymic mood, full affect  ICD site is stable, no tethering or discomfort   EKG:  not done today  ICD interrogation done today and reviewed by myself:  Battery and lead measurements are good Rare brief PATs LV pacing only, 99%  12/16/17: TTE Study Conclusions - Left ventricle: The cavity size was mildly dilated. Wall   thickness was increased in a pattern of mild LVH. Systolic   function was normal. The estimated ejection fraction was in the   range of 55% to 60%. Wall motion was normal; there were no   regional wall motion abnormalities. Doppler parameters are   consistent with abnormal left ventricular relaxation (grade 1   diastolic dysfunction). - Ascending aorta: The ascending aorta was mildly dilated. - Right ventricle: The cavity size was mildly dilated.   Impressions: - Normal LV systolic function; mild diastolic dysfunction; mild   LVH; mild LVE; mildly dilated ascending aorta; mildly dilated RV.  05/17/17: TTE: LVEF 30-35% 12/03/16: TTE: 30-35%   01/03/17: [facility] IMPRESSION:Ms  Nanninga Has normal coronary arteries and severely depressed LV function. She has a nonischemic cardiomyopathy. She'll need optimal medical management. The sheath was removed and a TR band was placed on the right wrist which is patent hemostasis. The patient left the lab in stable condition. She will be discharged home later today and will follow-up with me as an outpatient in 2-3 weeks.  Recent Labs: 06/12/2022: Hemoglobin 15.2; Platelets 244.0  No results found for requested labs within last 365 days.   CrCl cannot be calculated (Patient's most recent lab result is older than the maximum 21 days allowed.).   Wt Readings from Last 3 Encounters:  07/30/22 (!) 344 lb (156 kg)  07/09/22 (!) 338 lb (153.3 kg)  05/21/22 (!) 335 lb (152 kg)     Other studies reviewed: Additional studies/records reviewed today include: summarized above  ASSESSMENT A ND PLAN:  1. ICD     Intact function, no programming changes made  2. NICM with recovered LVEF     No symptoms or exam findings of fluid OL     On BB, entresto, HCTZ     Following with Dr. Valetta Mole       3. HTN     Repeat 138/83, she reports better at home   5. HLD     Deferred to Dr. Allyson Sabal, pt intolerant to statins and declined zetia     Disposition: remotes as usual, we can see her back again in a year for her device, sooner if needed.      Current medicines are reviewed at length with the patient today.  The patient did not have any concerns regarding medicines.  Monica Fredrickson, PA-C 07/30/2022 4:13 PM     [facility] [address] [place] [address] [phone] (office)  [phone] (fax)

## 2022-07-30 ENCOUNTER — Encounter: Payer: Self-pay | Admitting: Physician Assistant

## 2022-07-30 ENCOUNTER — Ambulatory Visit: Payer: 59 | Attending: Physician Assistant | Admitting: Physician Assistant

## 2022-07-30 VITALS — BP 138/82 | HR 67 | Ht 69.0 in | Wt 344.0 lb

## 2022-07-30 DIAGNOSIS — I428 Other cardiomyopathies: Secondary | ICD-10-CM | POA: Diagnosis not present

## 2022-07-30 DIAGNOSIS — I10 Essential (primary) hypertension: Secondary | ICD-10-CM | POA: Diagnosis not present

## 2022-07-30 DIAGNOSIS — Z95810 Presence of automatic (implantable) cardiac defibrillator: Secondary | ICD-10-CM

## 2022-07-30 LAB — CUP PACEART INCLINIC DEVICE CHECK
Date Time Interrogation Session: 20231016165515
HighPow Impedance: 84 Ohm
Implantable Lead Implant Date: 20180912
Implantable Lead Implant Date: 20180912
Implantable Lead Implant Date: 20180912
Implantable Lead Location: 753858
Implantable Lead Location: 753859
Implantable Lead Location: 753860
Implantable Lead Model: 293
Implantable Lead Model: 4674
Implantable Lead Model: 7741
Implantable Lead Serial Number: 435131
Implantable Lead Serial Number: 813037
Implantable Lead Serial Number: 920623
Implantable Pulse Generator Implant Date: 20180912
Lead Channel Impedance Value: 664 Ohm
Lead Channel Impedance Value: 666 Ohm
Lead Channel Impedance Value: 772 Ohm
Lead Channel Pacing Threshold Amplitude: 0.7 V
Lead Channel Pacing Threshold Amplitude: 0.8 V
Lead Channel Pacing Threshold Amplitude: 0.9 V
Lead Channel Pacing Threshold Pulse Width: 0.4 ms
Lead Channel Pacing Threshold Pulse Width: 0.4 ms
Lead Channel Pacing Threshold Pulse Width: 0.4 ms
Lead Channel Sensing Intrinsic Amplitude: 25 mV
Lead Channel Sensing Intrinsic Amplitude: 3.6 mV
Lead Channel Sensing Intrinsic Amplitude: 7.7 mV
Lead Channel Setting Pacing Amplitude: 2 V
Lead Channel Setting Pacing Amplitude: 2.5 V
Lead Channel Setting Pacing Amplitude: 2.5 V
Lead Channel Setting Pacing Pulse Width: 0.4 ms
Lead Channel Setting Pacing Pulse Width: 0.4 ms
Lead Channel Setting Sensing Sensitivity: 0.5 mV
Lead Channel Setting Sensing Sensitivity: 1 mV
Pulse Gen Serial Number: 193993

## 2022-07-30 NOTE — Patient Instructions (Signed)
Medication Instructions:   Your physician recommends that you continue on your current medications as directed. Please refer to the Current Medication list given to you today.  *If you need a refill on your cardiac medications before your next appointment, please call your pharmacy*   Lab Work: [place]   If you have labs (blood work) drawn today and your tests are completely normal, you will receive your results only by: [place] (if you have MyChart) OR A paper copy in the mail If you have any lab test that is abnormal or we need to change your treatment, we will call you to review the results.   Testing/Procedures: NONE ORDERED  TODAY   Follow-Up: At [facility], you and your health needs are our priority.  As part of our continuing mission to provide you with exceptional heart care, we have created designated Provider Care Teams.  These Care Teams include your primary Cardiologist (physician) and Advanced Practice Providers (APPs -  Physician Assistants and Nurse Practitioners) who all work together to provide you with the care you need, when you need it.  We recommend signing up for the patient portal called "MyChart".  Sign up information is provided on this After Visit Summary.  MyChart is used to connect with patients for Virtual Visits (Telemedicine).  Patients are able to view lab/test results, encounter notes, upcoming appointments, etc.  Non-urgent messages can be sent to your provider as well.   To learn more about what you can do with MyChart, go to [email or web address].    Your next appointment:  NEXT AVAILABLE WITH DR BERRY  1 year(s)  The format for your next appointment:   In Person  Provider:   Cristopher Peru, MD    Other Instructions   Important Information About Sugar

## 2022-08-13 ENCOUNTER — Ambulatory Visit (INDEPENDENT_AMBULATORY_CARE_PROVIDER_SITE_OTHER): Payer: 59

## 2022-08-13 DIAGNOSIS — I5022 Chronic systolic (congestive) heart failure: Secondary | ICD-10-CM

## 2022-08-13 DIAGNOSIS — Z95810 Presence of automatic (implantable) cardiac defibrillator: Secondary | ICD-10-CM | POA: Diagnosis not present

## 2022-08-15 ENCOUNTER — Telehealth: Payer: Self-pay

## 2022-08-15 NOTE — Progress Notes (Signed)
EPIC Encounter for ICM Monitoring  Patient Name: Monica Solis is a 61 y.o. female Date: 08/15/2022 Primary Care Physican: Reynold Bowen, MD Primary Cardiologist: Gwenlyn Found Electrophysiologist: Lovena Le  02/21/2022 Weight: 330 - 335 lbs 07/11/2022 Weight: 342 lbs   Attempted call to patient and unable to reach.  Left detailed message per DPR regarding transmission. Transmission reviewed.      HeartLogic HF Index is 0 suggesting fluid levels are within normal threshold range.     Prescribed: Hydrochlorothiazide 12.5 mg Take 12.5 mg by mouth daily as needed (fluid).    Labs: 12/27/2020 Creatinine 0.85, BUN 13, Potassium 4.5, Sodium 141, GFR 79 A complete set of results can be found in Results Review.   Recommendations:  Left voice mail with ICM number and encouraged to call if experiencing any fluid symptoms.   Follow-up plan: ICM clinic phone appointment on 09/17/2022.   91 day device clinic remote transmission 10/03/2022.              EP/Cardiology next office visit:   Recall 07/25/2023 with Dr Lovena Le.  12/11/2022 with Dr Gwenlyn Found.          Copy of ICM check sent to Dr. Lovena Le.  3 Month Trend    8 Day Data Trend          [name], RN 08/15/2022 5:20 AM

## 2022-08-15 NOTE — Telephone Encounter (Signed)
Remote ICM transmission received.  Attempted call to patient regarding ICM remote transmission and left detailed message per DPR.  Advised to return call for any fluid symptoms or questions. Next ICM remote transmission scheduled 09/17/2022.

## 2022-08-27 ENCOUNTER — Telehealth: Payer: Self-pay | Admitting: Physician Assistant

## 2022-08-27 ENCOUNTER — Ambulatory Visit: Payer: 59 | Admitting: Nurse Practitioner

## 2022-09-17 ENCOUNTER — Ambulatory Visit (INDEPENDENT_AMBULATORY_CARE_PROVIDER_SITE_OTHER): Payer: 59

## 2022-09-17 DIAGNOSIS — Z95810 Presence of automatic (implantable) cardiac defibrillator: Secondary | ICD-10-CM

## 2022-09-17 DIAGNOSIS — I5022 Chronic systolic (congestive) heart failure: Secondary | ICD-10-CM

## 2022-09-19 ENCOUNTER — Telehealth: Payer: Self-pay

## 2022-09-19 NOTE — Telephone Encounter (Signed)
Remote ICM transmission received.  Attempted call to patient regarding ICM remote transmission and left detailed message per DPR.  Advised to return call for any fluid symptoms or questions. Next ICM remote transmission scheduled 10/29/2022.

## 2022-09-19 NOTE — Progress Notes (Signed)
EPIC Encounter for ICM Monitoring  Patient Name: Monica Solis is a 61 y.o. female Date: 09/19/2022 Primary Care Physican: Reynold Bowen, MD Primary Cardiologist: Gwenlyn Found Electrophysiologist: Lovena Le  02/21/2022 Weight: 330 - 335 lbs 07/11/2022 Weight: 342 lbs   Attempted call to patient and unable to reach.  Left detailed message per DPR regarding transmission. Transmission reviewed.      HeartLogic HF Index is 5 suggesting fluid levels are within normal threshold range.     Prescribed: Hydrochlorothiazide 12.5 mg Take 12.5 mg by mouth daily as needed (fluid).    Labs: 12/27/2020 Creatinine 0.85, BUN 13, Potassium 4.5, Sodium 141, GFR 79 A complete set of results can be found in Results Review.   Recommendations:  Left voice mail with ICM number and encouraged to call if experiencing any fluid symptoms.   Follow-up plan: ICM clinic phone appointment on 10/29/2022.   91 day device clinic remote transmission 01/02/2023.              EP/Cardiology next office visit:   Recall 07/25/2023 with Dr Lovena Le.  12/11/2022 with Dr Gwenlyn Found.          Copy of ICM check sent to Dr. Lovena Le.  3 month ICM trend: 09/17/2022.    12-14 Month ICM trend:     [name], RN 09/19/2022 3:17 PM

## 2022-10-03 ENCOUNTER — Ambulatory Visit (INDEPENDENT_AMBULATORY_CARE_PROVIDER_SITE_OTHER): Payer: 59

## 2022-10-03 DIAGNOSIS — I428 Other cardiomyopathies: Secondary | ICD-10-CM | POA: Diagnosis not present

## 2022-10-03 LAB — CUP PACEART REMOTE DEVICE CHECK
Battery Remaining Longevity: 90 mo
Battery Remaining Percentage: 100 %
Brady Statistic RA Percent Paced: 0 %
Brady Statistic RV Percent Paced: 0 %
Date Time Interrogation Session: 20231220005100
HighPow Impedance: 64 Ohm
Implantable Lead Connection Status: 753985
Implantable Lead Connection Status: 753985
Implantable Lead Connection Status: 753985
Implantable Lead Implant Date: 20180912
Implantable Lead Implant Date: 20180912
Implantable Lead Implant Date: 20180912
Implantable Lead Location: 753858
Implantable Lead Location: 753859
Implantable Lead Location: 753860
Implantable Lead Model: 293
Implantable Lead Model: 4674
Implantable Lead Model: 7741
Implantable Lead Serial Number: 435131
Implantable Lead Serial Number: 813037
Implantable Lead Serial Number: 920623
Implantable Pulse Generator Implant Date: 20180912
Lead Channel Impedance Value: 493 Ohm
Lead Channel Impedance Value: 610 Ohm
Lead Channel Impedance Value: 730 Ohm
Lead Channel Setting Pacing Amplitude: 2 V
Lead Channel Setting Pacing Amplitude: 2.5 V
Lead Channel Setting Pacing Amplitude: 2.5 V
Lead Channel Setting Pacing Pulse Width: 0.4 ms
Lead Channel Setting Pacing Pulse Width: 0.4 ms
Lead Channel Setting Sensing Sensitivity: 0.5 mV
Lead Channel Setting Sensing Sensitivity: 1 mV
Pulse Gen Serial Number: 193993
Zone Setting Status: 755011

## 2022-10-07 ENCOUNTER — Other Ambulatory Visit: Payer: Self-pay | Admitting: Physician Assistant

## 2022-10-07 DIAGNOSIS — K529 Noninfective gastroenteritis and colitis, unspecified: Secondary | ICD-10-CM

## 2022-10-16 DIAGNOSIS — E039 Hypothyroidism, unspecified: Secondary | ICD-10-CM | POA: Insufficient documentation

## 2022-10-16 DIAGNOSIS — G4730 Sleep apnea, unspecified: Secondary | ICD-10-CM | POA: Insufficient documentation

## 2022-10-16 DIAGNOSIS — I509 Heart failure, unspecified: Secondary | ICD-10-CM | POA: Insufficient documentation

## 2022-10-16 DIAGNOSIS — I10 Essential (primary) hypertension: Secondary | ICD-10-CM | POA: Insufficient documentation

## 2022-10-22 ENCOUNTER — Encounter: Payer: Self-pay | Admitting: Nurse Practitioner

## 2022-10-29 ENCOUNTER — Ambulatory Visit (INDEPENDENT_AMBULATORY_CARE_PROVIDER_SITE_OTHER): Payer: 59

## 2022-10-29 DIAGNOSIS — Z95810 Presence of automatic (implantable) cardiac defibrillator: Secondary | ICD-10-CM | POA: Diagnosis not present

## 2022-10-29 DIAGNOSIS — I5022 Chronic systolic (congestive) heart failure: Secondary | ICD-10-CM

## 2022-10-29 NOTE — Progress Notes (Signed)
Remote ICD transmission.   

## 2022-11-01 NOTE — Progress Notes (Signed)
EPIC Encounter for ICM Monitoring  Patient Name: Monica Solis is a 62 y.o. female Date: 11/01/2022 Primary Care Physican: Reynold Bowen, MD Primary Cardiologist: Gwenlyn Found Electrophysiologist: Lovena Le  02/21/2022 Weight: 330 - 335 lbs 07/11/2022 Weight: 342 lbs   Spoke with patient and heart failure questions reviewed.  Transmission results reviewed.  Pt asymptomatic for fluid accumulation.  Reports feeling well at this time and voices no complaints.       HeartLogic HF Index is 2 suggesting fluid levels are within normal threshold range.     Prescribed: Hydrochlorothiazide 12.5 mg Take 12.5 mg by mouth daily as needed (fluid).    Labs: 12/27/2020 Creatinine 0.85, BUN 13, Potassium 4.5, Sodium 141, GFR 79 A complete set of results can be found in Results Review.   Recommendations:  No changes and encouraged to call if experiencing any fluid symptoms.   Follow-up plan: ICM clinic phone appointment on 12/04/2022.   91 day device clinic remote transmission 01/02/2023.              EP/Cardiology next office visit:   Recall 07/25/2023 with Dr Lovena Le.  12/11/2022 with Dr Gwenlyn Found.          Copy of ICM check sent to Dr. Lovena Le.  3 Month HeartLogicT Heart Failure Index:    8 Day Data Trend:          [name], RN 11/01/2022 2:40 PM

## 2022-11-05 ENCOUNTER — Ambulatory Visit (INDEPENDENT_AMBULATORY_CARE_PROVIDER_SITE_OTHER): Payer: 59 | Admitting: Nurse Practitioner

## 2022-11-05 ENCOUNTER — Encounter: Payer: Self-pay | Admitting: Nurse Practitioner

## 2022-11-05 VITALS — BP 122/82 | HR 67 | Ht 69.0 in | Wt 344.0 lb

## 2022-11-05 DIAGNOSIS — Z780 Asymptomatic menopausal state: Secondary | ICD-10-CM

## 2022-11-05 DIAGNOSIS — B372 Candidiasis of skin and nail: Secondary | ICD-10-CM

## 2022-11-05 DIAGNOSIS — Z01419 Encounter for gynecological examination (general) (routine) without abnormal findings: Secondary | ICD-10-CM | POA: Diagnosis not present

## 2022-11-05 MED ORDER — NYSTATIN 100000 UNIT/GM EX POWD: 1.0000 | Freq: Three times a day (TID) | CUTANEOUS | 1 refills | Status: DC

## 2022-11-05 NOTE — Progress Notes (Signed)
   [facility] [birth year] [id number]   History:  62 y.o. G1P0010 presents for annual exam. No GYN complaints. Postmenopausal - No HRT, no bleeding. Normal pap and mammogram history. History of DTV, HTN, CHF, DM, hypothyroidism, factor IV Leiden, HLD.  Gynecologic History Patient's last menstrual period was 08/07/2016.   Contraception/Family planning: post menopausal status and tubal ligation Sexually active: No  Health Maintenance Last Pap: 12/23/2018. Results were: Normal, 5-year repeat Last mammogram: 11/02/2022. Results were: No report yet Last colonoscopy: 05/21/2022. Results were: Tubular adenoma, 7-year recall Last Dexa: Never  Past medical history, past surgical history, family history and social history were all reviewed and documented in the EPIC chart. Married. Retired Music therapist.   ROS:  A ROS was performed and pertinent positives and negatives are included.  Exam:  Vitals:   11/05/22 0951  BP: 122/82  Pulse: 67  SpO2: 97%  Weight: (!) 344 lb (156 kg)  Height: '5\' 9"'$  (1.753 m)    Body mass index is 50.8 kg/m.  General appearance:  Normal Thyroid:  Symmetrical, normal in size, without palpable masses or nodularity. Respiratory  Auscultation:  Clear without wheezing or rhonchi Cardiovascular  Auscultation:  Regular rate, without rubs, murmurs or gallops  Edema/varicosities:  Not grossly evident Abdominal  Soft,nontender, without masses, guarding or rebound.  Liver/spleen:  No organomegaly noted  Hernia:  None appreciated  Skin  Inspection:  Redness under abdominal folds and in groin c/w yeast Breasts: Examined lying and sitting.   Right: Without masses, retractions, nipple discharge or axillary adenopathy.   Left: Without masses, retractions, nipple discharge or axillary adenopathy. Genitourinary   Inguinal/mons:  Normal without inguinal adenopathy  External genitalia:  Normal appearing vulva with no masses, tenderness, or  lesions  BUS/Urethra/Skene's glands:  Normal  Vagina:  Normal appearing with normal color and discharge, no lesions  Cervix:  Normal appearing without discharge or lesions  Uterus:  Difficult to palpate due to body habitus but no gross masses or tenderness  Adnexa/parametria:     Rt: Normal in size, without masses or tenderness.   Lt: Normal in size, without masses or tenderness.  Anus and perineum: Normal  Digital rectal exam: Deferred  Patient informed chaperone available to be present for breast and pelvic exam. Patient has requested no chaperone to be present. Patient has been advised what will be completed during breast and pelvic exam.   Assessment/Plan:  62 y.o. G1P0010 for annual exam.   Well female exam with routine gynecological exam - Education provided on SBEs, importance of preventative screenings, current guidelines, high calcium diet, regular exercise, and multivitamin daily. Just established with new PCP, labs done there.   Postmenopausal - no HRT, no bleeding.   Skin yeast infection - Plan: nystatin (MYCOSTATIN/NYSTOP) powder three times per day x 7-10 days. Keep clean and dry to prevent recurrences.   Screening for cervical cancer - Normal Pap history.  Will repeat at 5-year interval per guidelines.  Screening for breast cancer - Normal mammogram history. Continue annual screenings. Normal breast exam today.  Screening for colon cancer - 05/2022 colonoscopy. Will repeat at  7-year interval per GI's recommendation.   Screening for osteoporosis - Average risk. Will plan for DXA at age 26.   Return in 1 year for annual.     Monica Gammon DNP, 10:18 AM 11/05/2022

## 2022-11-07 ENCOUNTER — Encounter: Payer: Self-pay | Admitting: Nurse Practitioner

## 2022-11-16 NOTE — Telephone Encounter (Signed)
Spoke with pt and advised pt to hold off on Miralax until visit with Dr.Beavers due to her concern

## 2022-11-16 NOTE — Telephone Encounter (Signed)
Patient stopped by office to inquire about a bill she owed. She also states she called in November inuring about two medications she was taking and never got a call back to discuss. Unfortunately it looks like the note was never entered into the the phone note. I made patient an appt for a follow-up with Dr.Beavers in March and asked if she would still like to speak with someone in regards to her questions on the rx, she would like a call back. Please advise.

## 2022-12-04 ENCOUNTER — Ambulatory Visit: Payer: 59

## 2022-12-04 DIAGNOSIS — Z95810 Presence of automatic (implantable) cardiac defibrillator: Secondary | ICD-10-CM

## 2022-12-04 DIAGNOSIS — I5022 Chronic systolic (congestive) heart failure: Secondary | ICD-10-CM | POA: Diagnosis not present

## 2022-12-05 ENCOUNTER — Telehealth: Payer: Self-pay

## 2022-12-05 NOTE — Progress Notes (Signed)
EPIC Encounter for ICM Monitoring  Patient Name: Monica Solis is a 62 y.o. female Date: 12/05/2022 Primary Care Physican: Martin[place], Sarah T, MD Primary Cardiologist: Gwenlyn Found Electrophysiologist: Lovena Le  02/21/2022 Weight: 330 - 335 lbs 07/11/2022 Weight: 342 lbs   Attempted call to patient and unable to reach.  Left detailed message per DPR regarding transmission. Transmission reviewed.       HeartLog[employer] HF Index is 1 suggesting fluid levels are within normal threshold range.     Prescribed: Hydrochlorothiazide 12.5 mg Take 12.5 mg by mouth daily as needed (fluid).    Labs: 12/27/2020 Creatinine 0.85, BUN 13, Potassium 4.5, Sodium 141, GFR 79 A complete set of results can be found in Results Review.   Recommendations:  Left voice mail with ICM number and encouraged to call if experiencing any fluid symptoms.   Follow-up plan: ICM clinic phone appointment on 01/07/2023.   91 day device clinic remote transmission 01/02/2023.              EP/Cardiology next office visit:   Recall 07/25/2023 with Dr Lovena Le.  12/11/2022 with Dr Gwenlyn Found.          Copy of ICM check sent to Dr. Lovena Le.  3 Month HeartLogicT Heart Failure Index:    8 Day Data Trend:          [name], RN 12/05/2022 10:44 AM

## 2022-12-05 NOTE — Telephone Encounter (Signed)
Remote ICM transmission received.  Attempted call to patient regarding ICM remote transmission and left detailed message per DPR.  Advised to return call for any fluid symptoms or questions. Next ICM remote transmission scheduled 01/07/2023.

## 2022-12-11 ENCOUNTER — Ambulatory Visit: Payer: 59 | Admitting: Cardiovascular Disease

## 2022-12-15 ENCOUNTER — Other Ambulatory Visit: Payer: Self-pay | Admitting: Cardiovascular Disease

## 2022-12-23 NOTE — Progress Notes (Signed)
Cardiology Office Note:    Date:  01/03/2023   ID:  Monica Solis, DOB 09-18-1961, MRN [id number]  PCP:  [place], Sarah T, MD  Cardiologist:  Quay Burow, MD  Electrophysiologist:  Cristopher Peru, MD   Referring MD: Reynold Bowen, MD   Chief Complaint: follow-up of non-ischemic cardiomyopathy  History of Present Illness:    Monica Solis is a 62 y.o. 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 Gwenlyn Found and Dr. Lovena Le and presents today for routine follow-up.   Patient was referred to Dr. Gwenlyn Found in 10/2016 for pre-op evaluation prior to gastric bypass surgery. She denied any cardiac symptoms at that time but did have a strong family history of CAD. Lexiscan Myoview and Echo were ordered for further evaluation. Myoview showed no evidence of ischemia but LVEF was moderately reduced. Echo showed LVEF of 30-35% with diffuse hypokinesis but paradoxical septal motion consistent with known LBBB as well as mild LVH and grade 1 diastolic dysfunction.This led to a cardiac catheterization in 12/2016 which showed normal coronaries. She was started on GDMT but repeat Echo in 05/2017 unchanged EF of 30-35%. She was referred to EP and underwent placement of [employer] ICD in 06/2017. Repeat Echo later that month showed improved EF to 55-60% with normal wall motion, mild LVH, and grade 1 diastolic dysfunction.  Patient was last seen by Tommye Standard, PA-C, in 07/2022 at which time she was doing well from a cardiac standpoint.   Patient presents today for follow-up. Patient  biggest complaint today is dizziness. She states she has a long history of dizziness but it seems to be getting worse. She states she used to only have dizziness right after a quick position changes but now she will have it after she has been up ambulating for a while. She thinks it is due to her Entresto. She also reports some exertional fatigue which she states has been going on for about 1 year. She has to take a break while shopping at the grocery store due to fatigue and dizziness. She also describes some dyspnea on exertion but this seems stable. Weight is stable at home. She has stable orthopnea and lower extremity edema. No PND. No chest pain. She states she was having some palpitations in January/February that were waking her up at night. This occurred after she got a COVID booster. She states she always has some reaction to vaccinations/ injections. The palpitations seemed to have resolved. No syncope. She states she has been having a lot of issues recently with diverticulitis/ colitis which is being managed with GI.  Past Medical History:  Diagnosis Date   Abnormal uterine bleeding (AUB)    AICD (automatic cardioverter/defibrillator) present    At risk for sleep apnea    STOP-BANG=  5       SENT TO PCP 10-01-2016   Bilateral swelling of feet    Blood clot in vein    "superficial blood clots in BLE; I had 6-7 since 2000" (06/26/2017)   BMI 45.0-49.9, adult (HCC)    Chewing difficulty    CHF (congestive heart failure) ([place])  Chronic bronchitis (HCC)    Colitis    Constipation    Diverticulosis of colon    Edema    Endometrial polyp    Factor 5 Leiden mutation, heterozygous ([place])    dx 2011   GERD (gastroesophageal reflux disease)    High cholesterol    History of DVT of lower extremity 2000; 2011   "I've had them in both"   History of ectopic pregnancy 1998   Hyperlipidemia    Hypertension    Hypothyroidism    Left bundle branch block (LBBB)    Non-ischemic cardiomyopathy  ([place])    Osteoarthritis    "all over" (06/26/2017)   Other fatigue    Peripheral neuropathy    PONV (postoperative nausea and vomiting)    Shortness of breath    Shortness of breath on exertion    Sleep apnea    no CPAP   Type 2 diabetes mellitus treated with insulin ([place])    Vitamin D deficiency     Past Surgical History:  Procedure Laterality Date   BI-VENTRICULAR IMPLANTABLE CARDIOVERTER DEFIBRILLATOR  (CRT-D)  06/26/2017   BIOPSY  05/21/2022   Procedure: BIOPSY;  Surgeon: Thornton Park, MD;  Location: [facility];  Service: Gastroenterology;;  EGD and COLON   BIV ICD INSERTION CRT-D N/A 06/26/2017   Procedure: BIV ICD INSERTION CRT-D;  Surgeon: Evans Lance, MD;  Location: [facility];  Service: Cardiovascular;  Laterality: N/A;   CHOLECYSTECTOMY N/A 06/02/2021   COLONOSCOPY  2013   COLONOSCOPY WITH PROPOFOL N/A 05/21/2022   Procedure: COLONOSCOPY WITH PROPOFOL;  Surgeon: Thornton Park, MD;  Location: [facility];  Service: Gastroenterology;  Laterality: N/A;   DILATATION & CURETTAGE/HYSTEROSCOPY WITH MYOSURE N/A 10/05/2016   Procedure: [place];  Surgeon: Anastasio Auerbach, MD;  Location: [facility];  Service: Gynecology;  Laterality: N/A;  requests to follow 7:30am caserequests one hour OR time   DILATION AND CURETTAGE OF UTERUS  12/06/2007   ESOPHAGOGASTRODUODENOSCOPY (EGD) WITH PROPOFOL N/A 05/21/2022   Procedure: ESOPHAGOGASTRODUODENOSCOPY (EGD) WITH PROPOFOL;  Surgeon: Thornton Park, MD;  [facility]: [facility];  Service: Gastroenterology;  Laterality: N/A;   LAPAROSCOPY BILATERAL TUBAL LIGATION WITH FALOPE RINGS/  D&C HYSTEROSCOPY WITH POLYPECTOMY  12/06/2007   and Lysis Adhesions   LEFT HEART CATH AND CORONARY ANGIOGRAPHY N/A 01/03/2017   Procedure: Left Heart Cath and Coronary Angiography;  Surgeon: Lorretta Harp, MD;  Location: [facility];  Service: Cardiovascular;  Laterality: N/A;    POLYPECTOMY  05/21/2022   Procedure: POLYPECTOMY;  Surgeon: Thornton Park, MD;  [facility]: [facility];  Service: Gastroenterology;;   TUBAL LIGATION  12/06/2007    Current Medications: Current Meds  Medication Sig   carvedilol (COREG) 3.125 MG tablet TAKE 1 TABLET BY MOUTH 2 TIMES DAILY.   Cholecalciferol (VITAMIN D3) 2000 UNITS capsule Take 4,000 Units by mouth daily with supper.   ciprofloxacin (CIPRO) 500 MG tablet Take 1 tablet (500 mg total) by mouth 2 (two) times daily.   esomeprazole (NEXIUM) 40 MG capsule Take 40 mg by mouth daily.   fexofenadine (ALLEGRA) 180 MG tablet Take 180 mg by mouth daily as needed for allergies or rhinitis.   furosemide (LASIX) 40 MG tablet Take 1 tablet by mouth for swelling and/or weight gain of 3 lbs overnight and 5 lbs in a week.   gabapentin (NEURONTIN) 100 MG capsule Take 100 mg in the morning and 300 mg at bedtime   hydrochlorothiazide (HYDRODIURIL) 12.5 MG tablet  TAKE 1 TABLET BY MOUTH EVERY DAY AS NEEDED   insulin aspart (NOVOLOG) 100 UNIT/ML injection Inject 7-10 Units into the skin 3 (three) times daily as needed for high blood sugar (Per SSC 7-10units when cbg above 120).   insulin detemir (LEVEMIR FLEXPEN) 100 UNIT/ML FlexPen Inject 3 mLs into the skin daily.   levothyroxine (SYNTHROID) 88 MCG tablet Take 88 mcg by mouth daily.   losartan (COZAAR) 25 MG tablet Take 1 tablet (25 mg total) by mouth daily.   Menthol, Topical Analgesic, (BENGAY EX) Apply 1 application topically 2 (two) times daily as needed (pain).   Multiple Vitamin (MULTIVITAMIN) tablet Take 1 tablet by mouth daily. Geritol   nystatin (MYCOSTATIN/NYSTOP) powder Apply 1 Application topically 3 (three) times daily.   XARELTO 20 MG TABS tablet Take 1 tablet (20 mg total) by mouth daily.   [DISCONTINUED] sacubitril-valsartan (ENTRESTO) 24-26 MG TAKE 1 TABLET BY MOUTH TWICE A DAY     Allergies:   Crestor [rosuvastatin calcium], Nexletol [bempedoic acid], Other, Benadryl  [diphenhydramine hcl], Canagliflozin, Metformin, Pravastatin, Tramadol, and Victoza [liraglutide]   Social History   Socioeconomic History   Marital status: Married    Spouse name: Chrissie Noa   Number of children: 0   Years of education: Not on file   Highest education level: Not on file  Occupational History   Occupation: home maker  Tobacco Use   Smoking status: Former    Packs/day: 0.10    Years: 2.00    Additional pack years: 0.00    Total pack years: 0.20    Types: Cigarettes    Quit date: 10/02/1979    Years since quitting: 43.2    Passive exposure: Past   Smokeless tobacco: Never  Vaping Use   Vaping Use: Never used  Substance and Sexual Activity   Alcohol use: No    Alcohol/week: 0.0 standard drinks of alcohol   Drug use: No   Sexual activity: Not Currently    Partners: Male    Birth control/protection: Surgical, Post-menopausal    Comment: BTL, First IC >16 y/o, <5 Partners  Other Topics Concern   Not on file  Social History Narrative   Not on file   Social Determinants of Health   Financial Resource Strain: Not on file  Food Insecurity: Not on file  Transportation Needs: Not on file  Physical Activity: Not on file  Stress: Not on file  Social Connections: Not on file     Family History: The patient's family history includes Alzheimer's disease in her maternal aunt and mother; Anxiety disorder in her mother; Atrial fibrillation in her mother; Breast cancer in her maternal aunt; Cancer in her maternal grandfather; Diabetes in her maternal grandmother and mother; Heart disease in her father, maternal grandmother, mother, paternal grandfather, and paternal grandmother; Hyperlipidemia in her mother; Hypertension in her mother; Mitral valve prolapse in her sister; Obesity in her mother; Sleep apnea in her mother; Stroke in her mother; Sudden death in her father. There is no history of Colon cancer, Liver disease, Pancreatic cancer, Esophageal cancer, Stomach cancer,  Colon polyps, or Rectal cancer.  ROS:   Please see the history of present illness.     EKGs/Labs/Other Studies Reviewed:    The following studies were reviewed   Echocardiogram 12/03/2016: Study Conclusions: - Left ventricle: The cavity size was moderately dilated. There was    mild concentric hypertrophy. Systolic function was normal.    Diffuse hypokinesis. Doppler parameters are consistent with    abnormal left ventricular  relaxation (grade 1 diastolic    dysfunction). Doppler parameters are consistent with elevated    ventricular end-diastolic filling pressure.  - Ventricular septum: Septal motion showed paradox.  - Aortic valve: There was no regurgitation.  - Ascending aorta: The ascending aorta was normal in size.  - Mitral valve: There was mild regurgitation.  - Left atrium: The atrium was moderately dilated.  - Right ventricle: Systolic function was normal.  - Tricuspid valve: There was trivial regurgitation.  - Pulmonary arteries: Systolic pressure was within the normal    range.  - Inferior vena cava: The vessel was normal in size.   Impressions:  - LVEF is severely decreased at 30-35% with diffuse hypokinesis and    paradoxical septal motion.    RVEF is normal.  _______________  Left Cardiac Catheterization 01/03/2017: There is severe left ventricular systolic dysfunction. LV end diastolic pressure is mildly elevated. The left ventricular ejection fraction is 25-35% by visual estimate.  Impressions: Ms Westerlund Has normal coronary arteries and severely depressed LV function. She has a nonischemic cardiomyopathy. She'll need optimal medical management. The sheath was removed and a TR band was placed on the right wrist which is patent hemostasis. The patient left the lab in stable condition. She will be discharged home later today and will follow-up with me as an outpatient in 2-3 weeks.  _______________  Echocardiogram 12/16/2017: Study Conclusions: - Left ventricle:  The cavity size was mildly dilated. Wall    thickness was increased in a pattern of mild LVH. Systolic    function was normal. The estimated ejection fraction was in the    range of 55% to 60%. Wall motion was normal; there were no    regional wall motion abnormalities. Doppler parameters are    consistent with abnormal left ventricular relaxation (grade 1    diastolic dysfunction).  - Ascending aorta: The ascending aorta was mildly dilated.  - Right ventricle: The cavity size was mildly dilated.   Impressions:  - Normal LV systolic function; mild diastolic dysfunction; mild    LVH; mild LVE; mildly dilated ascending aorta; mildly dilated RV.   EKG:  EKG ordered today. EKG and rhythm strip personally reviewed and demonstrates ventricular paced rhythm. There is a lot of underlying artifact but does not appear to have any significant ST/T changes. Of note, underlying artifact persistent despite multiple attempts at adjusting leads and patient position.   Recent Labs: 06/12/2022: Hemoglobin 15.2; Platelets 244.0  Recent Lipid Panel    Component Value Date/Time   CHOL 233 (H) 12/27/2020 0943   TRIG 140 12/27/2020 0943   HDL 47 12/27/2020 0943   LDLCALC 161 (H) 12/27/2020 0943    Physical Exam:    Vital Signs: BP 122/73 (BP Location: Right Arm, Patient Position: Sitting, Cuff Size: Normal)   Pulse 67   Ht 5\' 9"  (1.753 m)   Wt (!) 340 lb 9.6 oz (154.5 kg)   LMP 08/07/2016   SpO2 97%   BMI 50.30 kg/m     Orthostatic Vital Signs:  - Supine:    BP 111/77, P 70 - Sitting:     BP 114/81, P 71 - Standing: BP 116/80   Wt Readings from Last 3 Encounters:  01/03/23 (!) 340 lb 9.6 oz (154.5 kg)  11/05/22 (!) 344 lb (156 kg)  07/30/22 (!) 344 lb (156 kg)     General: 62 y.o. morbidly obese Caucasian female in no acute distress. HEENT: Normocephalic and atraumatic. Sclera clear. EOMs intact. Neck: Supple. No  carotid bruits. No JVD. Heart:  RRR. Distinct S1 and S2. No significant  murmurs, gallops, or rubs.  Lungs: No increased work of breathing. Clear to ausculation bilaterally. No wheezes, rhonchi, or rales.  Abdomen: Soft, non-distended, and non-tender to palpation.  Extremities: Mild lower extremity edema bilaterally.  Skin: Warm and dry. Neuro: Alert and oriented x3. No focal deficits. Psych: Normal affect. Responds appropriately.   Assessment:    1. Dizziness   2. Fatigue, unspecified type   3. Chronic combined systolic and diastolic heart failure ([place])   4. Non-ischemic cardiomyopathy (HCC)   5. Palpitations   6. S/P ICD (internal cardiac defibrillator) procedure   7. Primary hypertension   8. Hyperlipidemia, unspecified hyperlipidemia type   9. Type 2 diabetes mellitus with complication, with long-term current use of insulin ([place])   10. Heterozygous factor V Leiden mutation ([place])   11. Morbid obesity ([place])     Plan:    Dizziness Patient biggest complaint today is persistent dizziness. Used to occur only after quick position changes but now occurs after she has been up ambulating for a while. No palpitations with this. She thinks it is due to the [place]. - Orthostatic vital signs negative. - Will stop Entresto and start Losartan 25mg  daily instead to see if this helps.   Fatigue Patient reports exertional fatigue over the last year. Dyspnea on exertion is stable and she denies any chest pain. TSH was normal in 10/2022. - BMI 50.30 so suspect weight and deconditioning are playing a role in this. - Will update Echo to reassess LV function.  - Wonder if stopping Delene Loll will help. - She had clear coronaries in 2018 so think fatigue as an anginal equivalent is less likely. However, if above work-up is unremarkable and fatigue persists, cold consider repeat ischemic evaluation.   Non-Ischemic Cardiomyopathy Chronic Combined CHF Initially diagnosed in 11/2016 when found to have an EF of 30-35%. LHC at that time showed normal coronaries. She was  started on GDMT with no improvement in EF. She then underwent placement of ICD in 06/2017. Echo in 06/2018 showed normalization of EF to 55-60%. - Euvolemic on exam. Weight stable. - Will repeat Echo given it has been several years since we have reassess her EF. - Will stop Enresto and start Losartan given reports of dizziness.  - Continue Coreg 3.125mg  twice daily.  - Continue HCTZ 12.5mg  daily for now. Will recheck BMET today and switch this to Spironolactone if potassium will allow. - Will start Lasix 40mg  daily as needed for weight gain (3lbs in 1 day or 5lbs in 1 week) or worsening lower extremity edema given we are losing the diuretic effect in [place].  Palpitations Patient reported having some palpitations that were waking her up at night in January and February. This seemed to correlated with her getting her most recent COVID booster. They seemed to have now resolved. Last device check in 09/2022 showed likely atrial tachycardia. - Continue low dose Coreg as above. - She had another remote check of her ICD today but has not been reviewed yet. Can wait and see what this shows.   S/p ICD S/p [employer] ICD in 06/2017 for primary prevention of sudden cardiac death given non-ischemic cardiomyopathy. Last device interrogation in 09/2022 showed normal device function with ATR for likely atrial tachycardia.  - Followed by Dr. Lovena Le.  Hypertension BP well controlled.  - Current medications: Entresto 24-26mg  twice daily, Coreg 3.125mg  twice daily, and HCTZ 12.5mg  daily (listed as needed but  she states she takes this basically every day now). Will stop Enresto and switch to Losartan to see if this helps her dizziness.   Hyperlipidemia Lipid panel in 10/2022: Total Cholesterol 222, Triglycerides 121, HDL 50, LDL 150.  - Intolerant to Rosuvastatin, Pravastatin and Nexletol in the past.  - Discussed referral to lipid clinic to discuss other options (possibly PCSK9 inhibitor). However, she is  hesitant given multiple medication intolerances. She would like to think about it more.   Type 2 Diabetes Mellitus Hemoglobin A1c 6.5% in 10/2022. - On Insulin. - Management per PCP.  Factor 5 Leiden Deficiency History of DVT/ PE On chronic anticoagulation with Xarelto.  - Continue Xarelto 20mg  daily. She is compliant with this. - Management per PCP.  Morbid Obesity Obesity BMI 50.30. Weight is down for 4lbs since last visit in 07/2022 but she states this is due to have diarrhea. - She has previously been referred to a Nutritionist but started having GI issues (diverticulitis and colitis) so never followed up with them. Offered to place another referral but patient is still working with GI and wants to wait until GI issues resolve.  Disposition: Follow up in 2-3 months.    Medication Adjustments/Labs and Tests Ordered: Current medicines are reviewed at length with the patient today.  Concerns regarding medicines are outlined above.  Orders Placed This Encounter  Procedures   Basic metabolic panel   EKG [id number]   ECHOCARDIOGRAM COMPLETE   Meds ordered this encounter  Medications   losartan (COZAAR) 25 MG tablet    Sig: Take 1 tablet (25 mg total) by mouth daily.    Dispense:  90 tablet    Refill:  3   furosemide (LASIX) 40 MG tablet    Sig: Take 1 tablet by mouth for swelling and/or weight gain of 3 lbs overnight and 5 lbs in a week.    Dispense:  30 tablet    Refill:  1    Patient Instructions  Medication Instructions:   STOP Entresto  START Losartan 25 mg daily  TAKE Lasix 40 mg as needed weight gain of 3 lbs overnight, 5 lbs in a week and/or swelling of legs   *If you need a refill on your cardiac medications before your next appointment, please call your pharmacy*  Lab Work: Your physician recommends that you return for lab work TODAY:  BMP  If you have labs (blood work) drawn today and your tests are completely normal, you will receive your results only  by: [place] (if you have [place]) OR A paper copy in the mail If you have any lab test that is abnormal or we need to change your treatment, we will call you to review the results.  Testing/Procedures: Sande Rives, PA-C has requested that you have an echocardiogram. Echocardiography is a painless test that uses sound waves to create images of your heart. It provides your doctor with information about the size and shape of your heart and how well your heart's chambers and valves are working. This procedure takes approximately one hour. There are no restrictions for this procedure. Please do NOT wear cologne, perfume, aftershave, or lotions (deodorant is allowed). Please arrive 15 minutes prior to your appointment time.  Follow-Up: At [facility], you and your health needs are our priority.  As part of our continuing mission to provide you with exceptional heart care, we have created designated Provider Care Teams.  These Care Teams include your primary Cardiologist (physician) and Advanced Practice Providers (  APPs -  Physician Assistants and Nurse Practitioners) who all work together to provide you with the care you need, when you need it.   Your next appointment:   2-3 month(s)  Provider:   Sande Rives, PA-C        Other Instructions     Signed, Darreld Mclean, PA-C  01/03/2023 1:09 PM    [facility]

## 2022-12-24 ENCOUNTER — Telehealth: Payer: Self-pay | Admitting: Physician Assistant

## 2022-12-24 NOTE — Telephone Encounter (Signed)
Patient is calling seeking some antibiotics to help  with her flare up. Please advise

## 2022-12-24 NOTE — Telephone Encounter (Signed)
Estill Bamberg, please advise.

## 2022-12-25 ENCOUNTER — Ambulatory Visit: Payer: 59 | Admitting: Gastroenterology

## 2022-12-25 MED ORDER — CIPROFLOXACIN HCL 500 MG PO TABS: 500.0000 mg | ORAL_TABLET | Freq: Two times a day (BID) | ORAL | 0 refills | Status: DC

## 2022-12-25 NOTE — Telephone Encounter (Signed)
Contacted pt & pt states she is having diarrhea. Sending in Cipro 500 mg to pharmacy per Estill Bamberg

## 2023-01-02 ENCOUNTER — Ambulatory Visit (INDEPENDENT_AMBULATORY_CARE_PROVIDER_SITE_OTHER): Payer: 59

## 2023-01-02 DIAGNOSIS — I428 Other cardiomyopathies: Secondary | ICD-10-CM

## 2023-01-03 ENCOUNTER — Ambulatory Visit: Payer: 59 | Attending: Cardiovascular Disease | Admitting: Student

## 2023-01-03 ENCOUNTER — Encounter: Payer: Self-pay | Admitting: Student

## 2023-01-03 VITALS — BP 122/73 | HR 67 | Ht 69.0 in | Wt 340.6 lb

## 2023-01-03 DIAGNOSIS — D6851 Activated protein C resistance: Secondary | ICD-10-CM

## 2023-01-03 DIAGNOSIS — I428 Other cardiomyopathies: Secondary | ICD-10-CM

## 2023-01-03 DIAGNOSIS — Z95810 Presence of automatic (implantable) cardiac defibrillator: Secondary | ICD-10-CM

## 2023-01-03 DIAGNOSIS — R5383 Other fatigue: Secondary | ICD-10-CM

## 2023-01-03 DIAGNOSIS — R42 Dizziness and giddiness: Secondary | ICD-10-CM | POA: Diagnosis not present

## 2023-01-03 DIAGNOSIS — R002 Palpitations: Secondary | ICD-10-CM

## 2023-01-03 DIAGNOSIS — E118 Type 2 diabetes mellitus with unspecified complications: Secondary | ICD-10-CM

## 2023-01-03 DIAGNOSIS — I5042 Chronic combined systolic (congestive) and diastolic (congestive) heart failure: Secondary | ICD-10-CM | POA: Diagnosis not present

## 2023-01-03 DIAGNOSIS — Z794 Long term (current) use of insulin: Secondary | ICD-10-CM

## 2023-01-03 DIAGNOSIS — I10 Essential (primary) hypertension: Secondary | ICD-10-CM

## 2023-01-03 DIAGNOSIS — E785 Hyperlipidemia, unspecified: Secondary | ICD-10-CM

## 2023-01-03 LAB — CUP PACEART REMOTE DEVICE CHECK
Battery Remaining Longevity: 84 mo
Battery Remaining Percentage: 95 %
Brady Statistic RA Percent Paced: 0 %
Brady Statistic RV Percent Paced: 0 %
Date Time Interrogation Session: 20240321005200
HighPow Impedance: 78 Ohm
Implantable Lead Connection Status: 753985
Implantable Lead Connection Status: 753985
Implantable Lead Connection Status: 753985
Implantable Lead Implant Date: 20180912
Implantable Lead Implant Date: 20180912
Implantable Lead Implant Date: 20180912
Implantable Lead Location: 753858
Implantable Lead Location: 753859
Implantable Lead Location: 753860
Implantable Lead Model: 293
Implantable Lead Model: 4674
Implantable Lead Model: 7741
Implantable Lead Serial Number: 435131
Implantable Lead Serial Number: 813037
Implantable Lead Serial Number: 920623
Implantable Pulse Generator Implant Date: 20180912
Lead Channel Impedance Value: 524 Ohm
Lead Channel Impedance Value: 723 Ohm
Lead Channel Impedance Value: 778 Ohm
Lead Channel Setting Pacing Amplitude: 2 V
Lead Channel Setting Pacing Amplitude: 2.5 V
Lead Channel Setting Pacing Amplitude: 2.5 V
Lead Channel Setting Pacing Pulse Width: 0.4 ms
Lead Channel Setting Pacing Pulse Width: 0.4 ms
Lead Channel Setting Sensing Sensitivity: 0.5 mV
Lead Channel Setting Sensing Sensitivity: 1 mV
Pulse Gen Serial Number: 193993
Zone Setting Status: 755011

## 2023-01-03 MED ORDER — FUROSEMIDE 40 MG PO TABS: ORAL_TABLET | ORAL | 1 refills | Status: DC

## 2023-01-03 MED ORDER — LOSARTAN POTASSIUM 25 MG PO TABS: 25.0000 mg | ORAL_TABLET | Freq: Every day | ORAL | 3 refills | Status: DC

## 2023-01-03 NOTE — Patient Instructions (Signed)
Medication Instructions:   STOP Entresto  START Losartan 25 mg daily  TAKE Lasix 40 mg as needed weight gain of 3 lbs overnight, 5 lbs in a week and/or swelling of legs   *If you need a refill on your cardiac medications before your next appointment, please call your pharmacy*  Lab Work: Your physician recommends that you return for lab work TODAY:  BMP  If you have labs (blood work) drawn today and your tests are completely normal, you will receive your results only by: [place] (if you have [place]) OR A paper copy in the mail If you have any lab test that is abnormal or we need to change your treatment, we will call you to review the results.  Testing/Procedures: Sande Rives, PA-C has requested that you have an echocardiogram. Echocardiography is a painless test that uses sound waves to create images of your heart. It provides your doctor with information about the size and shape of your heart and how well your heart's chambers and valves are working. This procedure takes approximately one hour. There are no restrictions for this procedure. Please do NOT wear cologne, perfume, aftershave, or lotions (deodorant is allowed). Please arrive 15 minutes prior to your appointment time.  Follow-Up: At [facility], you and your health needs are our priority.  As part of our continuing mission to provide you with exceptional heart care, we have created designated Provider Care Teams.  These Care Teams include your primary Cardiologist (physician) and Advanced Practice Providers (APPs -  Physician Assistants and Nurse Practitioners) who all work together to provide you with the care you need, when you need it.   Your next appointment:   2-3 month(s)  Provider:   Sande Rives, PA-C        Other Instructions

## 2023-01-04 LAB — BASIC METABOLIC PANEL
BUN/Creatinine Ratio: 16 (ref 12–28)
BUN: 16 mg/dL (ref 8–27)
CO2: 20 mmol/L (ref 20–29)
Calcium: 10.1 mg/dL (ref 8.7–10.3)
Chloride: 102 mmol/L (ref 96–106)
Creatinine, Ser: 0.97 mg/dL (ref 0.57–1.00)
Glucose: 106 mg/dL — ABNORMAL HIGH (ref 70–99)
Potassium: 4.9 mmol/L (ref 3.5–5.2)
Sodium: 145 mmol/L — ABNORMAL HIGH (ref 134–144)
eGFR: 67 mL/min/{1.73_m2} (ref >59)

## 2023-01-07 ENCOUNTER — Ambulatory Visit: Payer: 59 | Attending: Internal Medicine

## 2023-01-07 DIAGNOSIS — Z95810 Presence of automatic (implantable) cardiac defibrillator: Secondary | ICD-10-CM

## 2023-01-07 DIAGNOSIS — I5042 Chronic combined systolic (congestive) and diastolic (congestive) heart failure: Secondary | ICD-10-CM

## 2023-01-11 NOTE — Progress Notes (Signed)
EPIC Encounter for ICM Monitoring  Patient Name: Monica Solis is a 62 y.o. female Date: 01/11/2023 Primary Care Physican: [place], Sarah T, MD Primary Cardiologist: Gwenlyn Found Electrophysiologist: Lovena Le  02/21/2022 Weight: 330 - 335 lbs 07/11/2022 Weight: 342 lbs   Spoke with patient and heart failure questions reviewed.  Transmission results reviewed.  Pt asymptomatic for fluid accumulation.  She is feeling better and having less dizziness episodes since Entresto was stopped at last OV.     HeartLogic HF Index is 5 suggesting fluid levels are within normal threshold range.     Prescribed:  Hydrochlorothiazide 12.5 mg Take 12.5 mg by mouth daily as needed (fluid).  Furosemide 40 mg take 1 tablet (40 mg total) by mouth for swelling and/or weight gain of 3 lbs overnight and 5 lbs in a week.    Labs: 01/03/2023 Creatinine 0.97, BUN 16, Potassium 4.9, Sodium 145, GFR 67 A complete set of results can be found in Results Review.   Recommendations:  No changes and encouraged to call if experiencing any fluid symptoms.   Follow-up plan: ICM clinic phone appointment on 02/11/2023.   91 day device clinic remote transmission 04/03/2023.              EP/Cardiology next office visit:  03/05/2023 with Sande Rives, [place].    Recall 07/25/2023 with Dr Lovena Le.  12/11/2022 with Dr Gwenlyn Found.          Copy of ICM check sent to Dr. Lovena Le.  3 Month HeartLogicT Heart Failure Index:    8 Day Data Trend:          [name], RN 01/11/2023 3:16 PM

## 2023-01-16 ENCOUNTER — Other Ambulatory Visit: Payer: Self-pay | Admitting: Cardiovascular Disease

## 2023-01-16 ENCOUNTER — Other Ambulatory Visit: Payer: Self-pay

## 2023-01-16 MED ORDER — HYDROCHLOROTHIAZIDE 12.5 MG PO TABS: 12.5000 mg | ORAL_TABLET | Freq: Every day | ORAL | 3 refills | Status: DC | PRN

## 2023-01-16 NOTE — Telephone Encounter (Signed)
Called and spoke with pharmacist "Joe" he states that they did receive the HCTZ 12.5 mg and it is filled and waiting for pt to pick up

## 2023-01-25 ENCOUNTER — Other Ambulatory Visit: Payer: Self-pay

## 2023-01-25 MED ORDER — FUROSEMIDE 40 MG PO TABS: ORAL_TABLET | ORAL | 3 refills | Status: DC

## 2023-01-29 NOTE — Progress Notes (Signed)
02/04/2023 Monica Solis [id number] 1960-11-09  Referring provider: [place], Sarah T, MD Primary GI doctor: Dr. 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   Adenomatous polyp of ascending colon 05/21/2022 colonoscopy and endoscopy with Dr. Orvan Falconer 2 adenomatous polyps 4 to 8 mm ascending colon without high-grade dysplasia-recall 7 years  Class 3 severe obesity due to excess calories with serious comorbidity and body mass index (BMI) of 45.0 to 49.9 in adult Discussed GLP1 with the patient, mechanism of action and how this can worsen and/or cause nausea by causing gastroparesis and constipation      History of Present Illness:  62 y.o. 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  05/21/2022 colonoscopy and endoscopy with Dr. Orvan Falconer For abdominal pain, diverticulosis, altered vascular erythematous mucosa in sigmoid colon, 2 adenomatous polyps 4 to 8 mm ascending colon without high-grade dysplasia-recall 7 years. Showed multiple gastric polyps, otherwise unremarkable Duodenal biopsy negative for celiac, negative H. pylori gastric polyps fundic gland polyps. Sigmoid biopsies did show erosion with mild active inflammation, differential includes diverticulitis related colitis.  07/09/2022 patient having left lower quadrant pain with loose stools, increased mesalamine to 4.8  g daily and added on dicyclomine She has been off mesalamine and dicyclomine due to constipation in Nov.  12/24/2022 called back with worsening diarrhea and abdominal pain, given 1 month Cipro for possible scad. But she only took about 12 days worth after feeling better.  Since that time she states her Bm's have been once or twice a day but loose stools, occ AB pain. Uses heating pad that helps. No blood in stool.  She had first injection of mounjaro last week and states her BM has improved even further, more solids stools, feels complete BM, occ hard stools.   No fever, no chills.  No nausea, no vomiting at this time.   No family history of autoimmune disease.  Mom with history of diverticulitis with perforation and colostomy bag.   She  reports that she quit smoking about 43 years ago. Her smoking use included cigarettes. She has a 0.20 pack-year smoking history. She has been exposed to tobacco smoke. She has never used smokeless tobacco. She reports that she does not drink alcohol and does not use drugs. Her family history includes Alzheimer's disease in her maternal aunt and mother; Anxiety disorder in her mother; Atrial fibrillation in her mother; Breast cancer in her maternal aunt; Cancer in her maternal grandfather; Diabetes in her maternal grandmother and mother; Heart disease in her father, maternal grandmother, mother, paternal grandfather, and paternal grandmother; Hyperlipidemia in her mother; Hypertension in her mother; Mitral valve prolapse in her sister; Obesity in her mother; Sleep apnea in her mother; Stroke in her mother; Sudden death in her father.   Current Medications:   Current Outpatient Medications (Endocrine & Metabolic):    insulin aspart (NOVOLOG) 100 UNIT/ML injection, Inject 7-10 Units into the skin 3 (three) times daily as needed for  high blood sugar (Per [facility] 7-10units when cbg above 120).   insulin detemir (LEVEMIR FLEXPEN) 100 UNIT/ML FlexPen, Inject 3 mLs into the  skin daily.   LANTUS SOLOSTAR 100 UNIT/ML Solostar Pen,    levothyroxine (SYNTHROID) 88 MCG tablet, Take 88 mcg by mouth daily.   tirzepatide [facility]) 2.5 MG/0.5ML Pen, Inject 2.5 mg into the skin once a week.   Current Outpatient Medications (Cardiovascular):    carvedilol (COREG) 3.125 MG tablet, TAKE 1 TABLET BY MOUTH 2 TIMES DAILY.   furosemide (LASIX) 40 MG tablet, Take 1 tablet by mouth for swelling and/or weight gain of 3 lbs overnight and 5 lbs in a week.   hydrochlorothiazide (HYDRODIURIL) 12.5 MG tablet, Take 1 tablet (12.5 mg total) by mouth daily as needed.   losartan (COZAAR) 25 MG tablet, Take 1 tablet (25 mg total) by mouth daily.   Current Outpatient Medications (Respiratory):    fexofenadine (ALLEGRA) 180 MG tablet, Take 180 mg by mouth daily as needed for allergies or rhinitis.     Current Outpatient Medications (Hematological):    XARELTO 20 MG TABS tablet, Take 1 tablet (20 mg total) by mouth daily.   Current Outpatient Medications (Other):    Cholecalciferol (VITAMIN D3) 2000 UNITS capsule, Take 4,000 Units by mouth daily with supper.   ciprofloxacin (CIPRO) 500 MG tablet, Take 1 tablet (500 mg total) by mouth 2 (two) times daily.   esomeprazole (NEXIUM) 40 MG capsule, Take 40 mg by mouth daily.   gabapentin (NEURONTIN) 100 MG capsule, Take 100 mg in the morning and 300 mg at bedtime   Menthol, Topical Analgesic, (BENGAY EX), Apply 1 application topically 2 (two) times daily as needed (pain).   Multiple Vitamin (MULTIVITAMIN) tablet, Take 1 tablet by mouth daily. Geritol   nystatin (MYCOSTATIN/NYSTOP) powder, Apply 1 Application topically 3 (three) times daily.   Facility-Administered Medications Ordered in Other Visits (Other):    technetium tetrofosmin (TC-MYOVIEW) injection 32.4 millicurie No current facility-administered medications for this visit.  Surgical History:  She  has a past surgical history that includes LAPAROSCOPY BILATERAL TUBAL LIGATION  WITH FALOPE RINGS/  D&C HYSTEROSCOPY WITH POLYPECTOMY (12/06/2007); Colonoscopy (2013); Dilatation & curettage/hysteroscopy with myosure (N/A, 10/05/2016); LEFT HEART CATH AND CORONARY ANGIOGRAPHY (N/A, 01/03/2017); Bi-ventricular implantable cardioverter defibrillator  (crt-d) (06/26/2017); Dilation and curettage of uterus (12/06/2007); Tubal ligation (12/06/2007); BIV ICD INSERTION CRT-D (N/A, 06/26/2017); Cholecystectomy (N/A, 06/02/2021); Esophagogastroduodenoscopy (egd) with propofol (N/A, 05/21/2022); Colonoscopy with propofol (N/A, 05/21/2022); biopsy (05/21/2022); and polypectomy (05/21/2022).  Current Medications, Allergies, Past Medical History, Past Surgical History, Family History and Social History were reviewed in [employer] record.  Physical Exam: BP 128/84   Pulse 77   Ht  (1.753 m)   Wt (!) 337 lb 9.6 oz (153.1 kg)   LMP 08/07/2016   SpO2 99%   BMI 49.85 kg/m  General:   Pleasant,  obese female in no acute distress Heart : Regular rate and rhythm; no murmurs Pulm: Clear anteriorly; no wheezing Abdomen:  Soft, Obese AB, Active bowel sounds. No tenderness. Without guarding and Without rebound, No organomegaly appreciated. Rectal: Not evaluated Extremities:  with  edema. Neurologic:  Alert and  oriented x4;  No focal deficits.  Psych:  Cooperative. Normal mood and affect.   Doree Albee, PA-C 02/04/23

## 2023-02-04 ENCOUNTER — Encounter: Payer: Self-pay | Admitting: Physician Assistant

## 2023-02-04 ENCOUNTER — Ambulatory Visit: Payer: 59 | Admitting: Physician Assistant

## 2023-02-04 VITALS — BP 128/84 | HR 77 | Ht 69.0 in | Wt 337.6 lb

## 2023-02-04 DIAGNOSIS — K5730 Diverticulosis of large intestine without perforation or abscess without bleeding: Secondary | ICD-10-CM

## 2023-02-04 DIAGNOSIS — D122 Benign neoplasm of ascending colon: Secondary | ICD-10-CM | POA: Diagnosis not present

## 2023-02-04 DIAGNOSIS — K529 Noninfective gastroenteritis and colitis, unspecified: Secondary | ICD-10-CM | POA: Diagnosis not present

## 2023-02-04 DIAGNOSIS — Z6841 Body Mass Index (BMI) 40.0 and over, adult: Secondary | ICD-10-CM

## 2023-02-04 NOTE — Patient Instructions (Addendum)
The mounjaro can cause nausea, do small frequent meals, see gastroparesis diet below.  Stop if too much nausea  Mounjaro can also cause constipation. Dicyclomine can worsen constipation so avoid it unless you needed it for AB pain and can do heating pad and try IBGard  Miralax is an osmotic laxative.  It only brings more water into the stool.  This is safe to take daily.  Can take up to 17 gram of miralax twice a day.  Mix with juice or coffee.  Start 1/2 capful at night for 3-4 days and reassess your response in 3-4 days.  You can increase and decrease the dose based on your response.  Remember, it can take up to 3-4 days to take effect OR for the effects to wear off.   Benefiber adds bulk to stool.   If worsening blood in stool or pain, can take cipro and then restart mesalamine lower dose if needed   Gastroparesis Please do small frequent meals like 4-6 meals a day.  Eat and drink liquids at separate times.  Avoid high fiber foods, cook your vegetables, avoid high fat food.  Suggest spreading protein throughout the day (greek yogurt, glucerna, soft meat, milk, eggs) Choose soft foods that you can mash with a fork When you are more symptomatic, change to pureed foods foods and liquids.  Consider reading "Living well with Gastroparesis" by Reuel Derby Gastroparesis is a condition in which food takes longer than normal to empty from the stomach. This condition is also known as delayed gastric emptying. It is usually a long-term (chronic) condition. There is no cure, but there are treatments and things that you can do at home to help relieve symptoms. Treating the underlying condition that causes gastroparesis can also help relieve symptoms

## 2023-02-05 ENCOUNTER — Ambulatory Visit (HOSPITAL_COMMUNITY): Payer: 59

## 2023-02-09 NOTE — Progress Notes (Signed)
Reviewed and agree with management plans. ? ?Lucca Ballo L. Eugine Bubb, MD, MPH  ?

## 2023-02-11 ENCOUNTER — Ambulatory Visit: Payer: 59 | Attending: Internal Medicine

## 2023-02-11 DIAGNOSIS — I5042 Chronic combined systolic (congestive) and diastolic (congestive) heart failure: Secondary | ICD-10-CM

## 2023-02-11 DIAGNOSIS — Z95810 Presence of automatic (implantable) cardiac defibrillator: Secondary | ICD-10-CM | POA: Diagnosis not present

## 2023-02-11 NOTE — Progress Notes (Signed)
Remote ICD transmission.   

## 2023-02-15 ENCOUNTER — Telehealth: Payer: Self-pay

## 2023-02-15 NOTE — Progress Notes (Signed)
EPIC Encounter for ICM Monitoring  Patient Name: Monica Solis is a 62 y.o. female Date: 02/15/2023 Primary Care Physican: [place], Sarah T, MD Primary Cardiologist: Allyson Sabal Electrophysiologist: Ladona Ridgel  02/21/2022 Weight: 330 - 335 lbs 07/11/2022 Weight: 342 lbs 01/03/2023 Office Weight: 340 lbs   Attempted call to patient and unable to reach.  Left detailed message per DPR regarding transmission. Transmission reviewed.     HeartLogic HF Index is 3 suggesting fluid levels are within normal threshold range.     Prescribed:  Hydrochlorothiazide 12.5 mg Take 12.5 mg by mouth daily as needed (fluid).  Furosemide 40 mg take 1 tablet (40 mg total) by mouth for swelling and/or weight gain of 3 lbs overnight and 5 lbs in a week.    Labs: 01/03/2023 Creatinine 0.97, BUN 16, Potassium 4.9, Sodium 145, GFR 67 A complete set of results can be found in Results Review.   Recommendations:  Left voice mail with ICM number and encouraged to call if experiencing any fluid symptoms.   Follow-up plan: ICM clinic phone appointment on 03/18/2023.   91 day device clinic remote transmission 04/03/2023.              EP/Cardiology next office visit:  03/05/2023 with Marjie Skiff, PA.    Recall 07/25/2023 with Dr Ladona Ridgel.            Copy of ICM check sent to Dr. Ladona Ridgel.  3 Month HeartLogicT Heart Failure Index:    8 Day Data Trend:          Karie Soda, RN 02/15/2023 11:01 AM

## 2023-02-15 NOTE — Telephone Encounter (Signed)
Remote ICM transmission received.  Attempted call to patient regarding ICM remote transmission and left detailed message per DPR.  Advised to return call for any fluid symptoms or questions. Next ICM remote transmission scheduled 03/18/2023.    

## 2023-02-24 NOTE — Progress Notes (Signed)
Cardiology Office Note:    Date:  03/05/2023   ID:  Monica Solis, DOB December 11, 1960, MRN [id number]  PCP:  [place], Sarah T, MD  Cardiologist:  Nanetta Batty, MD  Electrophysiologist:  Lewayne Bunting, MD   Referring MD: [place], Sarah T, MD   Chief Complaint: follow-up of dizziness  History of Present Illness:    Monica Solis is a 62 y.o. 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 Allyson Sabal and Dr. Ladona Ridgel and presents today for follow-up of dizziness.    Patient was referred to Dr. Allyson Sabal in 10/2016 for pre-op evaluation prior to gastric bypass surgery. She denied any cardiac symptoms at that time but did have a strong family history of CAD. Lexiscan Myoview and Echo were ordered for further evaluation. Myoview showed no evidence of ischemia but LVEF was moderately reduced. Echo showed LVEF of 30-35% with diffuse hypokinesis but paradoxical septal motion consistent with known LBBB as well as mild LVH and grade 1 diastolic dysfunction.This led to a cardiac catheterization in 12/2016 which showed normal coronaries. She was started on GDMT but repeat Echo in 05/2017 unchanged EF of 30-35%. She was referred to EP and underwent placement of [employer] ICD in 06/2017. Repeat Echo later that month showed improved EF to 55-60% with normal wall motion, mild LVH, and grade 1 diastolic dysfunction.  Patient was last seen by me in 12/2022 at which time she reported worsening dizziness as well as exertional fatigue. She has a long history of dizziness but felt like it had been  getting worse lately and felt like this was due to [place]. Orthostatics were negative in the office. Monica Solis was stopped and she was started on Losartan. Obesity and deconditioning were suspected to be the cause of her fatigue but repeat Echo was ordered to reassess LV function. Echo showed LVEF of 60-65% with normal wall motion and diastolic parameters, mildly enlarged RV wit normal systolic function, and no significant valvular disease.    Patient presents today for follow-up.  Patient reports significant improvement in her dizziness after stopping Entresto and switching to Losartan.  She still states she has some mild dizziness if she is bending over for long periods of time or if she changes positions quickly but overall much improved.  She is very happy with this.  She also reports improvement in her fatigue off the Entresto.  She has chronic dyspnea on exertion but this is stable.  No shortness of breath at rest. She has chronic orthopnea but no PND.  She does describe a dry cough at night which seems to do better if she elevates her head more.  However, this is not new and is an intermittent issue. Her chronic lower extremity edema is stable. She denies any weight gain.  Her weight is down about 6lbs since last visit. She has only had to take one dose of her PRN Lasix but states she will not do this again due to  increased urinary frequency  She denies any chest pain.  She has a long history of reflux and describes intermittent heartburn/ indigestion which is clearly related  to certain meals but no other chest pain concerning for angina. Palpitations have improved since last visit. She has started [facility] since her last visit and is overall tolerating this well.  Past Medical History:  Diagnosis Date   Abnormal uterine bleeding (AUB)    AICD (automatic cardioverter/defibrillator) present    At risk for sleep apnea    STOP-BANG=  5       SENT TO PCP 10-01-2016   Bilateral swelling of feet     Blood clot in vein    "superficial blood clots in BLE; I had 6-7 since 2000" (06/26/2017)   BMI 45.0-49.9, adult (HCC)    Chewing difficulty    CHF (congestive heart failure) (HCC)    Chronic bronchitis (HCC)    Colitis    Constipation    Diverticulosis of colon    Edema    Endometrial polyp    Factor 5 Leiden mutation, heterozygous (HCC)    dx 2011   GERD (gastroesophageal reflux disease)    High cholesterol    History of DVT of lower extremity 2000; 2011   "I've had them in both"   History of ectopic pregnancy 1998   Hyperlipidemia    Hypertension    Hypothyroidism    Left bundle branch block (LBBB)    Non-ischemic cardiomyopathy (HCC)    Osteoarthritis    "all over" (06/26/2017)   Other fatigue    Peripheral neuropathy    PONV (postoperative nausea and vomiting)    Shortness of breath    Shortness of breath on exertion    Sleep apnea    no CPAP   Type 2 diabetes mellitus treated with insulin (HCC)    Vitamin D deficiency     Past Surgical History:  Procedure Laterality Date   BI-VENTRICULAR IMPLANTABLE CARDIOVERTER DEFIBRILLATOR  (CRT-D)  06/26/2017   BIOPSY  05/21/2022   Procedure: BIOPSY;  Surgeon: Tressia Danas, MD;  Location: [facility];  Service: Gastroenterology;;  EGD and COLON   BIV ICD INSERTION CRT-D N/A 06/26/2017   Procedure: BIV ICD INSERTION CRT-D;  Surgeon: Marinus Maw, MD;  Location: [facility] INVASIVE CV LAB;  Service: Cardiovascular;  Laterality: N/A;   CHOLECYSTECTOMY N/A 06/02/2021   COLONOSCOPY  2013   COLONOSCOPY WITH PROPOFOL N/A 05/21/2022   Procedure: COLONOSCOPY WITH PROPOFOL;  Surgeon: Tressia Danas, MD;  Location: [facility];  Service: Gastroenterology;  Laterality: N/A;   DILATATION & CURETTAGE/HYSTEROSCOPY WITH MYOSURE N/A 10/05/2016   Procedure: DILATATION & CURETTAGE/HYSTEROSCOPY WITH MYOSURE;  Surgeon: Dara Lords, MD;  Location: [facility];  Service: Gynecology;  Laterality: N/A;  requests to follow 7:30am  caserequests one hour OR time   DILATION AND CURETTAGE OF UTERUS  12/06/2007   ESOPHAGOGASTRODUODENOSCOPY (EGD) WITH PROPOFOL N/A 05/21/2022   Procedure: ESOPHAGOGASTRODUODENOSCOPY (EGD) WITH PROPOFOL;  Surgeon: Tressia Danas, MD;  Location: [facility];  Service: Gastroenterology;  Laterality: N/A;   LAPAROSCOPY BILATERAL TUBAL LIGATION WITH FALOPE RINGS/  D&C HYSTEROSCOPY WITH POLYPECTOMY  12/06/2007   and Lysis Adhesions   LEFT HEART CATH AND CORONARY ANGIOGRAPHY N/A 01/03/2017   Procedure: Left Heart Cath and Coronary Angiography;  Surgeon: Runell Gess, MD;  Location: [facility] INVASIVE CV LAB;  Service: Cardiovascular;  Laterality: N/A;   POLYPECTOMY  05/21/2022   Procedure: POLYPECTOMY;  Surgeon: Tressia Danas, MD;  Location: [facility];  Service: Gastroenterology;;   TUBAL LIGATION  12/06/2007    Current Medications: Current Meds  Medication Sig   B-D ULTRAFINE III SHORT PEN 31G X 8  MM MISC Inject into the skin 4 (four) times daily.   carvedilol (COREG) 3.125 MG tablet TAKE 1 TABLET BY MOUTH TWICE A DAY   Cholecalciferol (VITAMIN D3) 2000 UNITS capsule Take 4,000 Units by mouth daily with supper.   ciprofloxacin (CIPRO) 500 MG tablet Take 1 tablet (500 mg total) by mouth 2 (two) times daily.   esomeprazole (NEXIUM) 40 MG capsule Take 40 mg by mouth daily.   fexofenadine (ALLEGRA) 180 MG tablet Take 180 mg by mouth daily as needed for allergies or rhinitis.   furosemide (LASIX) 40 MG tablet Take 1 tablet by mouth for swelling and/or weight gain of 3 lbs overnight and 5 lbs in a week. (Patient taking differently: Take 20 mg by mouth as needed for edema or fluid. Take 1 tablet by mouth for swelling and/or weight gain of 3 lbs overnight and 5 lbs in a week.)   gabapentin (NEURONTIN) 100 MG capsule Take 100 mg in the morning and 300 mg at bedtime   insulin aspart (NOVOLOG) 100 UNIT/ML injection Inject 7-10 Units into the skin 3 (three) times daily as needed for high blood sugar (Per  [facility] 7-10units when cbg above 120).   LANTUS SOLOSTAR 100 UNIT/ML Solostar Pen    levothyroxine (SYNTHROID) 88 MCG tablet Take 88 mcg by mouth daily.   losartan (COZAAR) 25 MG tablet Take 1 tablet (25 mg total) by mouth daily.   Menthol, Topical Analgesic, (BENGAY EX) Apply 1 application topically 2 (two) times daily as needed (pain).   Multiple Vitamin (MULTIVITAMIN) tablet Take 1 tablet by mouth daily. Geritol   nystatin (MYCOSTATIN/NYSTOP) powder Apply 1 Application topically 3 (three) times daily.   tirzepatide [facility]) 2.5 MG/0.5ML Pen Inject 2.5 mg into the skin once a week.   XARELTO 20 MG TABS tablet Take 1 tablet (20 mg total) by mouth daily.   [DISCONTINUED] hydrochlorothiazide (HYDRODIURIL) 12.5 MG tablet Take 1 tablet (12.5 mg total) by mouth daily as needed. (Patient taking differently: Take 12.5 mg by mouth daily.)   [DISCONTINUED] hydrochlorothiazide (HYDRODIURIL) 12.5 MG tablet Take 12.5 mg by mouth daily. Take 1 Tablet Daily.     Allergies:   Crestor [rosuvastatin calcium], Nexletol [bempedoic acid], Other, Benadryl [diphenhydramine hcl], Canagliflozin, Metformin, Pravastatin, Tramadol, and Victoza [liraglutide]   Social History   Socioeconomic History   Marital status: Married    Spouse name: Iantha Fallen   Number of children: 0   Years of education: Not on file   Highest education level: Not on file  Occupational History   Occupation: home maker  Tobacco Use   Smoking status: Former    Packs/day: 0.10    Years: 2.00    Additional pack years: 0.00    Total pack years: 0.20    Types: Cigarettes    Quit date: 10/02/1979    Years since quitting: 43.4    Passive exposure: Past   Smokeless tobacco: Never  Vaping Use   Vaping Use: Never used  Substance and Sexual Activity   Alcohol use: No    Alcohol/week: 0.0 standard drinks of alcohol   Drug use: No   Sexual activity: Not Currently    Partners: Male    Birth control/protection: Surgical, Post-menopausal     Comment: BTL, First IC >16 y/o, <5 Partners  Other Topics Concern   Not on file  Social History Narrative   Not on file   Social Determinants of Health   Financial Resource Strain: Not on file  Food Insecurity: Not on file  Transportation  Needs: Not on file  Physical Activity: Not on file  Stress: Not on file  Social Connections: Not on file     Family History: The patient's family history includes Alzheimer's disease in her maternal aunt and mother; Anxiety disorder in her mother; Atrial fibrillation in her mother; Breast cancer in her maternal aunt; Cancer in her maternal grandfather; Diabetes in her maternal grandmother and mother; Heart disease in her father, maternal grandmother, mother, paternal grandfather, and paternal grandmother; Hyperlipidemia in her mother; Hypertension in her mother; Mitral valve prolapse in her sister; Obesity in her mother; Sleep apnea in her mother; Stroke in her mother; Sudden death in her father. There is no history of Colon cancer, Liver disease, Pancreatic cancer, Esophageal cancer, Stomach cancer, Colon polyps, or Rectal cancer.  ROS:   Please see the history of present illness.     EKGs/Labs/Other Studies Reviewed:    The following studies were reviewed:  Echocardiogram 12/03/2016: Study Conclusions: - Left ventricle: The cavity size was moderately dilated. There was    mild concentric hypertrophy. Systolic function was normal.    Diffuse hypokinesis. Doppler parameters are consistent with    abnormal left ventricular relaxation (grade 1 diastolic    dysfunction). Doppler parameters are consistent with elevated    ventricular end-diastolic filling pressure.  - Ventricular septum: Septal motion showed paradox.  - Aortic valve: There was no regurgitation.  - Ascending aorta: The ascending aorta was normal in size.  - Mitral valve: There was mild regurgitation.  - Left atrium: The atrium was moderately dilated.  - Right ventricle: Systolic  function was normal.  - Tricuspid valve: There was trivial regurgitation.  - Pulmonary arteries: Systolic pressure was within the normal    range.  - Inferior vena cava: The vessel was normal in size.   Impressions:  - LVEF is severely decreased at 30-35% with diffuse hypokinesis and    paradoxical septal motion.    RVEF is normal.  _______________   Left Cardiac Catheterization 01/03/2017: There is severe left ventricular systolic dysfunction. LV end diastolic pressure is mildly elevated. The left ventricular ejection fraction is 25-35% by visual estimate.   Impressions: Ms Hannig Has normal coronary arteries and severely depressed LV function. She has a nonischemic cardiomyopathy. She'll need optimal medical management. The sheath was removed and a TR band was placed on the right wrist which is patent hemostasis. The patient left the lab in stable condition. She will be discharged home later today and will follow-up with me as an outpatient in 2-3 weeks.  _______________  Echocardiogram 03/01/2023: Impressions: 1. Left ventricular ejection fraction, by estimation, is 60 to 65%. The  left ventricle has normal function. The left ventricle has no regional  wall motion abnormalities. Left ventricular diastolic parameters were  normal.   2. Right ventricular systolic function is normal. The right ventricular  size is mildly enlarged. There is normal pulmonary artery systolic  pressure.   3. The mitral valve is normal in structure. No evidence of mitral valve  regurgitation. No evidence of mitral stenosis.   4. The aortic valve is normal in structure. Aortic valve regurgitation is  not visualized. No aortic stenosis is present.   5. The inferior vena cava is normal in size with greater than 50%  respiratory variability, suggesting right atrial pressure of 3 mmHg.   EKG:  EKG not ordered today.   Recent Labs: 06/12/2022: Hemoglobin 15.2; Platelets 244.0 01/03/2023: BUN 16; Creatinine,  Ser 0.97; Potassium 4.9; Sodium 145  Recent  Lipid Panel    Component Value Date/Time   CHOL 233 (H) 12/27/2020 0943   TRIG 140 12/27/2020 0943   HDL 47 12/27/2020 0943   LDLCALC 161 (H) 12/27/2020 0943    Physical Exam:    Vital Signs: BP 118/80 (BP Location: Left Arm, Patient Position: Sitting, Cuff Size: Large)   Pulse 76   Ht 5\' 9"  (1.753 m)   Wt (!) 334 lb 3.2 oz (151.6 kg)   LMP 08/07/2016   SpO2 96%   BMI 49.35 kg/m     Wt Readings from Last 3 Encounters:  03/05/23 (!) 334 lb 3.2 oz (151.6 kg)  02/04/23 (!) 337 lb 9.6 oz (153.1 kg)  01/03/23 (!) 340 lb 9.6 oz (154.5 kg)     General: 62 y.o. obese Caucasian female in no acute distress. HEENT: Normocephalic and atraumatic. Sclera clear.  Neck: Supple. No JVD. Heart: RRR. Distinct S1 and S2. No murmurs, gallops, or rubs.  Lungs: No increased work of breathing. Clear to ausculation bilaterally. No wheezes, rhonchi, or rales.  Abdomen: Soft, non-distended, and non-tender to palpation.  Extremities:  Mild lower extremity edema bilaterally.     Skin: Warm and dry. Neuro: Alert and oriented x3. No focal deficits. Psych: Normal affect. Responds appropriately.  Assessment:    1. Dizziness   2. Non-ischemic cardiomyopathy (HCC)   3. Chronic combined systolic and diastolic CHF (congestive heart failure) (HCC)   4. S/P ICD (internal cardiac defibrillator) procedure   5. Primary hypertension   6. Hyperlipidemia, unspecified hyperlipidemia type   7. Type 2 diabetes mellitus with obesity (HCC)   8. Factor 5 Leiden mutation, heterozygous (HCC)   9. History of DVT (deep vein thrombosis)   10. History of pulmonary embolus (PE)   11. Morbid obesity (HCC)     Plan:    Dizziness Patient has a history of chronic dizziness but reported worsening dizziness at last office visit in 12/2022 which she felt like was due due to [facility].Orthostatics were negative.  Monica Solis was stopped and she was restarted on Losartan.  -  Significantly improved after switching to Losartan. - No additional work-up necessary at this time.     Non-Ischemic Cardiomyopathy Chronic Combined CHF Initially diagnosed in 11/2016 when found to have an EF of 30-35%. LHC at that time showed normal coronaries. She was started on GDMT with no improvement in EF. Most recent Echo on 03/01/2023 showed LVEF of 60-65% with normal wall motion and diastolic parameters, mildly enlarged RV wit normal systolic function, and no significant valvular disease.  - Euvolemic on exam. Weight down 6lbs since last visit.  - Continue Lasix as needed for weight gain (3lbs in 1 day or 5lbs in 1 week) or worsening lower extremity edema. However, will decrease dose from 40mg  to 20mg  due to the degree of urinary frequency on the 40mg .  - Continue Losartan 25mg  daily.  - Continue Coreg 3.125mg  twice daily.    S/p ICD S/p [employer] ICD in 06/2017 for primary prevention of sudden cardiac death given non-ischemic cardiomyopathy. Last device interrogation in 09/2022 showed normal device function with ATR for likely atrial tachycardia.  - Followed by Dr. Ladona Ridgel.   Hypertension BP well controlled.  - Continue current medications: Losartan 25mg  daily,  Coreg 3.125mg  twice daily, and HCTZ 12.5mg  daily.  Hyperlipidemia Lipid panel in 10/2022: Total Cholesterol 222, Triglycerides 121, HDL 50, LDL 150.  - Intolerant to Rosuvastatin, Pravastatin and Nexletol in the past.  - Discussed referral to lipid clinic to discuss  other options (possibly PCSK9 inhibitor) at last visit. However, she was hesitant given multiple medication intolerances. She would still like to hold off on this. She was recently started on Mounjaro and would like to see how her cholesterol does if she loses weight.    Type 2 Diabetes Mellitus Hemoglobin A1c 6.5% in 01/2023. - On Mounjaro and  Insulin. - Management per PCP.   Factor 5 Leiden Deficiency History of DVT/ PE On chronic anticoagulation with  Xarelto.  - Continue Xarelto 20mg  daily. She is compliant with this. - Management per PCP.   Morbid Obesity BMI 49.35. She was recently started on [facility] and has lost 6lbs since visit 2 months ago. - She has previously been referred to a Nutritionist but started having GI issues (diverticulitis and colitis) so never followed up with them. Offered to place another referral to Nutritionist or to [facility] but patient is still working with GI and wants to wait until GI issues resolve.   Disposition: Follow up in 6 months.   Medication Adjustments/Labs and Tests Ordered: Current medicines are reviewed at length with the patient today.  Concerns regarding medicines are outlined above.  No orders of the defined types were placed in this encounter.  Meds ordered this encounter  Medications   hydrochlorothiazide (HYDRODIURIL) 12.5 MG tablet    Sig: Take 1 tablet (12.5 mg total) by mouth daily. Take 1 Tablet Daily.    Dispense:  90 tablet    Refill:  3    Patient Instructions  Medication Instructions:  Decrease Lasix from 40 mg to 20 mg ( Take 1/2 of a 40 mg Tablet As Needed ). *If you need a refill on your cardiac medications before your next appointment, please call your pharmacy*   Lab Work: No Labs If you have labs (blood work) drawn today and your tests are completely normal, you will receive your results only by: MyChart Message (if you have MyChart) OR A paper copy in the mail If you have any lab test that is abnormal or we need to change your treatment, we will call you to review the results.   Testing/Procedures: No Testing   Follow-Up: At [facility], you and your health needs are our priority.  As part of our continuing mission to provide you with exceptional heart care, we have created designated Provider Care Teams.  These Care Teams include your primary Cardiologist (physician) and Advanced Practice Providers (APPs -  Physician  Assistants and Nurse Practitioners) who all work together to provide you with the care you need, when you need it.  We recommend signing up for the patient portal called "MyChart".  Sign up information is provided on this After Visit Summary.  MyChart is used to connect with patients for Virtual Visits (Telemedicine).  Patients are able to view lab/test results, encounter notes, upcoming appointments, etc.  Non-urgent messages can be sent to your provider as well.   To learn more about what you can do with MyChart, go to [email or web address].    Your next appointment:   6 month(s)  Provider:   Marjie Skiff, PA-C      Other Instructions  Heart Failure Education:  Weigh yourself EVERY morning after you go to the bathroom but before you eat or drink anything. Write this number down in a weight log/diary. If you gain 3 pounds overnight or 5 pounds in a week, call the office. Take your medicines as prescribed. If you have concerns about your  medications, please call us before you stop taking them. Eat low salt foods--Limit salt (sodium) to 2000 mg per day. This will help prevent your body from holding onto fluid. Read food labels as many processed foods have a lot of sodium, especially canned goods and prepackaged meats. If you would like some assistance choosing low sodium foods, we would be happy to set you up with a nutritionist. Limit all fluids for the day to less than 2 liters (64 ounces). Fluid includes all drinks, coffee, juice, ice chips, soup, jello, and all other liquids. Stay as active as you can everyday. Staying active will give you more energy and make your muscles stronger. Start with 5 minutes at a time and work your way up to 30 minutes a day. Break up your activities--do some in the morning and some in the afternoon. Start with 3 days per week and work your way up to 5 days as you can.  If you have chest pain, feel short of breath, dizzy, or lightheaded, STOP. If you don'Solis feel  better after a short rest, call 911. If you do feel better, call the office to let us know you have symptoms with exercise.    Thanks so much!  [address], [place], [place]  03/05/2023 8:11 PM    [facility]

## 2023-03-01 ENCOUNTER — Ambulatory Visit (HOSPITAL_COMMUNITY): Payer: 59 | Attending: Student

## 2023-03-01 DIAGNOSIS — I5042 Chronic combined systolic (congestive) and diastolic (congestive) heart failure: Secondary | ICD-10-CM

## 2023-03-01 LAB — ECHOCARDIOGRAM COMPLETE
Area-P 1/2: 3.08 cm2
Calc EF: 52.5 %
S' Lateral: 4 cm
Single Plane A2C EF: 57.1 %
Single Plane A4C EF: 51 %

## 2023-03-05 ENCOUNTER — Ambulatory Visit: Payer: 59 | Attending: Student | Admitting: Student

## 2023-03-05 ENCOUNTER — Other Ambulatory Visit: Payer: Self-pay | Admitting: Cardiovascular Disease

## 2023-03-05 ENCOUNTER — Encounter: Payer: Self-pay | Admitting: Student

## 2023-03-05 VITALS — BP 118/80 | HR 76 | Ht 69.0 in | Wt 334.2 lb

## 2023-03-05 DIAGNOSIS — I428 Other cardiomyopathies: Secondary | ICD-10-CM

## 2023-03-05 DIAGNOSIS — R42 Dizziness and giddiness: Secondary | ICD-10-CM

## 2023-03-05 DIAGNOSIS — E1169 Type 2 diabetes mellitus with other specified complication: Secondary | ICD-10-CM

## 2023-03-05 DIAGNOSIS — E669 Obesity, unspecified: Secondary | ICD-10-CM

## 2023-03-05 DIAGNOSIS — I5042 Chronic combined systolic (congestive) and diastolic (congestive) heart failure: Secondary | ICD-10-CM

## 2023-03-05 DIAGNOSIS — Z86718 Personal history of other venous thrombosis and embolism: Secondary | ICD-10-CM

## 2023-03-05 DIAGNOSIS — E785 Hyperlipidemia, unspecified: Secondary | ICD-10-CM

## 2023-03-05 DIAGNOSIS — Z86711 Personal history of pulmonary embolism: Secondary | ICD-10-CM

## 2023-03-05 DIAGNOSIS — Z95810 Presence of automatic (implantable) cardiac defibrillator: Secondary | ICD-10-CM

## 2023-03-05 DIAGNOSIS — I10 Essential (primary) hypertension: Secondary | ICD-10-CM

## 2023-03-05 DIAGNOSIS — Z794 Long term (current) use of insulin: Secondary | ICD-10-CM

## 2023-03-05 DIAGNOSIS — D6851 Activated protein C resistance: Secondary | ICD-10-CM

## 2023-03-05 MED ORDER — HYDROCHLOROTHIAZIDE 12.5 MG PO TABS: 12.5000 mg | ORAL_TABLET | Freq: Every day | ORAL | 3 refills | Status: AC

## 2023-03-05 NOTE — Patient Instructions (Signed)
Medication Instructions:  Decrease Lasix from 40 mg to 20 mg ( Take 1/2 of a 40 mg Tablet As Needed ). *If you need a refill on your cardiac medications before your next appointment, please call your pharmacy*   Lab Work: No Labs If you have labs (blood work) drawn today and your tests are completely normal, you will receive your results only by: MyChart Message (if you have MyChart) OR A paper copy in the mail If you have any lab test that is abnormal or we need to change your treatment, we will call you to review the results.   Testing/Procedures: No Testing   Follow-Up: At [facility], you and your health needs are our priority.  As part of our continuing mission to provide you with exceptional heart care, we have created designated Provider Care Teams.  These Care Teams include your primary Cardiologist (physician) and Advanced Practice Providers (APPs -  Physician Assistants and Nurse Practitioners) who all work together to provide you with the care you need, when you need it.  We recommend signing up for the patient portal called "MyChart".  Sign up information is provided on this After Visit Summary.  MyChart is used to connect with patients for Virtual Visits (Telemedicine).  Patients are able to view lab/test results, encounter notes, upcoming appointments, etc.  Non-urgent messages can be sent to your provider as well.   To learn more about what you can do with MyChart, go to [email or web address].    Your next appointment:   6 month(s)  Provider:   Marjie Skiff, PA-C      Other Instructions  Heart Failure Education:  Weigh yourself EVERY morning after you go to the bathroom but before you eat or drink anything. Write this number down in a weight log/diary. If you gain 3 pounds overnight or 5 pounds in a week, call the office. Take your medicines as prescribed. If you have concerns about your medications, please call us before you stop taking them. Eat low  salt foods--Limit salt (sodium) to 2000 mg per day. This will help prevent your body from holding onto fluid. Read food labels as many processed foods have a lot of sodium, especially canned goods and prepackaged meats. If you would like some assistance choosing low sodium foods, we would be happy to set you up with a nutritionist. Limit all fluids for the day to less than 2 liters (64 ounces). Fluid includes all drinks, coffee, juice, ice chips, soup, jello, and all other liquids. Stay as active as you can everyday. Staying active will give you more energy and make your muscles stronger. Start with 5 minutes at a time and work your way up to 30 minutes a day. Break up your activities--do some in the morning and some in the afternoon. Start with 3 days per week and work your way up to 5 days as you can.  If you have chest pain, feel short of breath, dizzy, or lightheaded, STOP. If you don't feel better after a short rest, call 911. If you do feel better, call the office to let us know you have symptoms with exercise.    Thanks so much!  Callie

## 2023-03-18 ENCOUNTER — Ambulatory Visit: Payer: 59 | Attending: Internal Medicine

## 2023-03-18 DIAGNOSIS — I5042 Chronic combined systolic (congestive) and diastolic (congestive) heart failure: Secondary | ICD-10-CM | POA: Diagnosis not present

## 2023-03-18 DIAGNOSIS — Z95810 Presence of automatic (implantable) cardiac defibrillator: Secondary | ICD-10-CM | POA: Diagnosis not present

## 2023-03-20 ENCOUNTER — Telehealth: Payer: Self-pay

## 2023-03-20 NOTE — Progress Notes (Signed)
EPIC Encounter for ICM Monitoring  Patient Name: Monica Solis is a 62 y.o. female Date: 03/20/2023 Primary Care Physican: [place], Sarah T, MD Primary Cardiologist: Allyson Sabal Electrophysiologist: Ladona Ridgel  02/21/2022 Weight: 330 - 335 lbs 07/11/2022 Weight: 342 lbs 01/03/2023 Office Weight: 340 lbs   Attempted call to patient and unable to reach.  Transmission reviewed.    HeartLogic HF Index is 4 suggesting fluid levels are within normal threshold range.     Prescribed:  Hydrochlorothiazide 12.5 mg Take 12.5 mg by mouth daily as needed (fluid).  Furosemide 40 mg take 1 tablet (40 mg total) by mouth for swelling and/or weight gain of 3 lbs overnight and 5 lbs in a week.    Labs: 01/03/2023 Creatinine 0.97, BUN 16, Potassium 4.9, Sodium 145, GFR 67 A complete set of results can be found in Results Review.   Recommendations:  Unable to reach.     Follow-up plan: ICM clinic phone appointment on 04/22/2023.   91 day device clinic remote transmission 04/03/2023.              EP/Cardiology next office visit:  Recall 09/01/2023 with Marjie Skiff, PA.    Recall 07/25/2023 with Dr Ladona Ridgel.            Copy of ICM check sent to Dr. Ladona Ridgel.  3 Month HeartLogicT Heart Failure Index:    8 Day Data Trend:          Karie Soda, RN 03/20/2023 8:47 AM

## 2023-03-20 NOTE — Telephone Encounter (Signed)
Remote ICM transmission received.  Attempted call to patient regarding ICM remote transmission and no answer or voice mail option.  

## 2023-04-03 ENCOUNTER — Ambulatory Visit (INDEPENDENT_AMBULATORY_CARE_PROVIDER_SITE_OTHER): Payer: 59

## 2023-04-03 DIAGNOSIS — I5042 Chronic combined systolic (congestive) and diastolic (congestive) heart failure: Secondary | ICD-10-CM

## 2023-04-03 DIAGNOSIS — I428 Other cardiomyopathies: Secondary | ICD-10-CM | POA: Diagnosis not present

## 2023-04-04 LAB — CUP PACEART REMOTE DEVICE CHECK
Battery Remaining Longevity: 84 mo
Battery Remaining Percentage: 94 %
Brady Statistic RA Percent Paced: 0 %
Brady Statistic RV Percent Paced: 0 %
Date Time Interrogation Session: 20240620005100
HighPow Impedance: 74 Ohm
Implantable Lead Connection Status: 753985
Implantable Lead Connection Status: 753985
Implantable Lead Connection Status: 753985
Implantable Lead Implant Date: 20180912
Implantable Lead Implant Date: 20180912
Implantable Lead Implant Date: 20180912
Implantable Lead Location: 753858
Implantable Lead Location: 753859
Implantable Lead Location: 753860
Implantable Lead Model: 293
Implantable Lead Model: 4674
Implantable Lead Model: 7741
Implantable Lead Serial Number: 435131
Implantable Lead Serial Number: 813037
Implantable Lead Serial Number: 920623
Implantable Pulse Generator Implant Date: 20180912
Lead Channel Impedance Value: 501 Ohm
Lead Channel Impedance Value: 642 Ohm
Lead Channel Impedance Value: 758 Ohm
Lead Channel Setting Pacing Amplitude: 2 V
Lead Channel Setting Pacing Amplitude: 2.5 V
Lead Channel Setting Pacing Amplitude: 2.5 V
Lead Channel Setting Pacing Pulse Width: 0.4 ms
Lead Channel Setting Pacing Pulse Width: 0.4 ms
Lead Channel Setting Sensing Sensitivity: 0.5 mV
Lead Channel Setting Sensing Sensitivity: 1 mV
Pulse Gen Serial Number: 193993
Zone Setting Status: 755011

## 2023-04-16 ENCOUNTER — Ambulatory Visit: Payer: 59 | Admitting: Student

## 2023-04-22 ENCOUNTER — Ambulatory Visit (INDEPENDENT_AMBULATORY_CARE_PROVIDER_SITE_OTHER): Payer: 59

## 2023-04-22 DIAGNOSIS — I5042 Chronic combined systolic (congestive) and diastolic (congestive) heart failure: Secondary | ICD-10-CM | POA: Diagnosis not present

## 2023-04-22 DIAGNOSIS — Z95810 Presence of automatic (implantable) cardiac defibrillator: Secondary | ICD-10-CM

## 2023-04-29 NOTE — Progress Notes (Signed)
EPIC Encounter for ICM Monitoring  Patient Name: Monica Solis is a 62 y.o. female Date: 04/29/2023 Primary Care Physican: [place], Sarah T, MD Primary Cardiologist: Allyson Sabal Electrophysiologist: Ladona Ridgel  02/21/2022 Weight: 330 - 335 lbs 07/11/2022 Weight: 342 lbs 01/03/2023 Office Weight: 340 lbs   Transmission reviewed.    HeartLogic HF Index is 8 suggesting fluid levels are within normal threshold range.     Prescribed:  Hydrochlorothiazide 12.5 mg Take 12.5 mg by mouth daily as needed (fluid).  Furosemide 40 mg take 1 tablet (40 mg total) by mouth for swelling and/or weight gain of 3 lbs overnight and 5 lbs in a week.    Labs: 01/03/2023 Creatinine 0.97, BUN 16, Potassium 4.9, Sodium 145, GFR 67 A complete set of results can be found in Results Review.   Recommendations:   No changes.     Follow-up plan: ICM clinic phone appointment on 05/27/2023.   91 day device clinic remote transmission 07/03/2023.              EP/Cardiology next office visit:  Recall 09/01/2023 with Marjie Skiff, PA.    Recall 07/25/2023 with Dr Ladona Ridgel.            Copy of ICM check sent to Dr. Ladona Ridgel.  3 Month HeartLogicT Heart Failure Index:    8 Day Data Trend:          Karie Soda, RN 04/29/2023 7:55 AM

## 2023-05-27 ENCOUNTER — Ambulatory Visit: Payer: 59 | Attending: Internal Medicine

## 2023-05-27 DIAGNOSIS — Z95810 Presence of automatic (implantable) cardiac defibrillator: Secondary | ICD-10-CM | POA: Diagnosis not present

## 2023-05-27 DIAGNOSIS — I5042 Chronic combined systolic (congestive) and diastolic (congestive) heart failure: Secondary | ICD-10-CM | POA: Diagnosis not present

## 2023-05-28 NOTE — Progress Notes (Signed)
EPIC Encounter for ICM Monitoring  Patient Name: Rubby Kountz is a 62 y.o. female Date: 05/28/2023 Primary Care Physican: [place], Sarah T, MD Primary Cardiologist: Allyson Sabal Electrophysiologist: Ladona Ridgel  02/21/2022 Weight: 330 - 335 lbs 07/11/2022 Weight: 342 lbs 01/03/2023 Office Weight: 340 lbs 05/28/2023 Weight: 320 lbs (started [facility] in April)   Spoke with patient and heart failure questions reviewed.  Transmission results reviewed.  Pt asymptomatic for fluid accumulation.  Reports feeling well at this time and voices no complaints.  She took 20 mg PRN Lasix for today's in the past week due to some fluid around the knee.     HeartLogic HF Index is 1 suggesting fluid levels are within normal threshold range.     Prescribed:  Hydrochlorothiazide 12.5 mg Take 12.5 mg by mouth daily.  Furosemide 40 mg take 1 tablet (40 mg total) by mouth for swelling and/or weight gain of 3 lbs overnight and 5 lbs in a week.    Labs: 04/25/2023 Creatinine 0.84, BUN 16, Potassium 3.7, Sodium 142, GFR 79 01/03/2023 Creatinine 0.97, BUN 16, Potassium 4.9, Sodium 145, GFR 67 A complete set of results can be found in Results Review.   Recommendations:   No changes and encouraged to call if experiencing any fluid symptoms.   Follow-up plan: ICM clinic phone appointment on 07/01/2023.   91 day device clinic remote transmission 07/03/2023.              EP/Cardiology next office visit:  08/08/2023 with Francis Dowse, PA.   Recall 09/01/2023 with Marjie Skiff, PA.    Recall 07/25/2023 with Dr Ladona Ridgel.            Copy of ICM check sent to Dr. Ladona Ridgel.  3 Month HeartLogicT Heart Failure Index:    8 Day Data Trend:          Karie Soda, RN 05/28/2023 11:01 AM

## 2023-07-01 ENCOUNTER — Ambulatory Visit: Payer: 59 | Attending: Internal Medicine

## 2023-07-01 DIAGNOSIS — I5042 Chronic combined systolic (congestive) and diastolic (congestive) heart failure: Secondary | ICD-10-CM | POA: Diagnosis not present

## 2023-07-01 DIAGNOSIS — Z95810 Presence of automatic (implantable) cardiac defibrillator: Secondary | ICD-10-CM | POA: Diagnosis not present

## 2023-07-03 ENCOUNTER — Ambulatory Visit (INDEPENDENT_AMBULATORY_CARE_PROVIDER_SITE_OTHER): Payer: 59

## 2023-07-03 DIAGNOSIS — I428 Other cardiomyopathies: Secondary | ICD-10-CM

## 2023-07-03 NOTE — Progress Notes (Signed)
EPIC Encounter for ICM Monitoring  Patient Name: Monica Solis is a 62 y.o. female Date: 07/03/2023 Primary Care Physican: [place], Sarah T, MD Primary Cardiologist: Allyson Sabal Electrophysiologist: Ladona Ridgel  02/21/2022 Weight: 330 - 335 lbs 07/11/2022 Weight: 342 lbs 01/03/2023 Office Weight: 340 lbs 05/28/2023 Weight: 320 lbs (started [facility] in April) 07/03/2023 Weight: 312 lbs   Spoke with patient and heart failure questions reviewed.  Transmission results reviewed.  Pt asymptomatic for fluid accumulation.  Reports feeling well at this time and voices no complaints.    HeartLogic HF Index is 3 suggesting fluid levels are within normal threshold range.     Prescribed:  Hydrochlorothiazide 12.5 mg Take 12.5 mg by mouth daily.  Furosemide 40 mg take 1 tablet (40 mg total) by mouth for swelling and/or weight gain of 3 lbs overnight and 5 lbs in a week.    Labs: 04/25/2023 Creatinine 0.84, BUN 16, Potassium 3.7, Sodium 142, GFR 79 01/03/2023 Creatinine 0.97, BUN 16, Potassium 4.9, Sodium 145, GFR 67 A complete set of results can be found in Results Review.   Recommendations:   No changes and encouraged to call if experiencing any fluid symptoms.   Follow-up plan: ICM clinic phone appointment on 08/05/2023.   91 day device clinic remote transmission 10/02/2023.              EP/Cardiology next office visit:  08/08/2023 with Francis Dowse, PA.   Recall 09/01/2023 with Marjie Skiff, PA.    Recall 07/25/2023 with Dr Ladona Ridgel.            Copy of ICM check sent to Dr. Ladona Ridgel.  3 Month HeartLogicT Heart Failure Index:    8 Day Data Trend:          Karie Soda, RN 07/03/2023 10:39 AM

## 2023-07-04 LAB — CUP PACEART REMOTE DEVICE CHECK
Battery Remaining Longevity: 78 mo
Battery Remaining Percentage: 92 %
Brady Statistic RA Percent Paced: 0 %
Brady Statistic RV Percent Paced: 0 %
Date Time Interrogation Session: 20240919080800
HighPow Impedance: 84 Ohm
Implantable Lead Connection Status: 753985
Implantable Lead Connection Status: 753985
Implantable Lead Connection Status: 753985
Implantable Lead Implant Date: 20180912
Implantable Lead Implant Date: 20180912
Implantable Lead Implant Date: 20180912
Implantable Lead Location: 753858
Implantable Lead Location: 753859
Implantable Lead Location: 753860
Implantable Lead Model: 293
Implantable Lead Model: 4674
Implantable Lead Model: 7741
Implantable Lead Serial Number: 435131
Implantable Lead Serial Number: 813037
Implantable Lead Serial Number: 920623
Implantable Pulse Generator Implant Date: 20180912
Lead Channel Impedance Value: 613 Ohm
Lead Channel Impedance Value: 666 Ohm
Lead Channel Impedance Value: 808 Ohm
Lead Channel Setting Pacing Amplitude: 2 V
Lead Channel Setting Pacing Amplitude: 2.5 V
Lead Channel Setting Pacing Amplitude: 2.5 V
Lead Channel Setting Pacing Pulse Width: 0.4 ms
Lead Channel Setting Pacing Pulse Width: 0.4 ms
Lead Channel Setting Sensing Sensitivity: 0.5 mV
Lead Channel Setting Sensing Sensitivity: 1 mV
Pulse Gen Serial Number: 193993
Zone Setting Status: 755011

## 2023-07-08 NOTE — Progress Notes (Unsigned)
07/10/2023 Monica Solis [id number] [birth year]  Referring provider: [place], Sarah T, MD Primary GI doctor:Dr. Danis Dr. 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  Will send in cipro.flagyl for the patient to have, if pain continues or labs show inflammation will start.  Can consider restarting on mesalamine  Adenomatous polyp of ascending colon 05/21/2022 colonoscopy and endoscopy with Dr. Orvan Falconer 2 adenomatous polyps 4 to 8 mm ascending colon without high-grade dysplasia-recall 7 years  Class 3 severe obesity due to excess calories with serious comorbidity and body mass index (BMI) of 45.0 to 49.9 in adult Discussed GLP1 with the patient, mechanism of action and how this can worsen and/or cause nausea by causing gastroparesis and constipation      History of Present Illness:  62 y.o. 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  05/21/2022 colonoscopy and endoscopy with Dr. Orvan Falconer For abdominal pain, diverticulosis, altered vascular erythematous mucosa in sigmoid colon, 2 adenomatous polyps 4 to 8 mm ascending colon without high-grade dysplasia-recall 7 years. Showed multiple gastric polyps, otherwise unremarkable Duodenal biopsy negative for celiac, negative H. pylori gastric polyps fundic gland polyps. Sigmoid biopsies did show erosion with mild active inflammation, differential includes diverticulitis  related colitis.  07/09/2022 patient having left lower quadrant pain with loose stools, increased mesalamine to 4.8 g daily and added on dicyclomine She has been off mesalamine and dicyclomine due to constipation in Nov.  12/24/2022 called back with worsening diarrhea and abdominal pain, given 1 month Cipro for possible scad. But she only took about 12 days worth after feeling better.  02/04/2023 office visit with myself with improvement on cipro, discussed retreating with cipro or restarting mesalamine.   Presents for follow up She states she has been doing well. She does not have regular Bm's, always loose/diarrhea, usually have Bm's all day but has been on mounjaro. She is now on monjaro 5 every week.  Will have BM once a day unless she ate something that disagreed with her, she can have more Bm's. Symptoms worse with greasy foods/spice.  Saturday she had a pork chop with worsening of her symptoms.  She started to have small volume loose stools, no blood.  She had AB bloating, AB pain LLQ radiating to her back which is what she normally feels  with a flare.  No dysuria, urgency, or changes in urine.  No fever, rare chills.  Rare nausea, no vomiting. Since then she has had AB pain, using tylenol/heating pad.  She continues on the miralax daily 1 capful, and will take colace at night as needed.   No family history of autoimmune disease.  Mom with history of diverticulitis with perforation and colostomy bag.   She  reports that she quit smoking about 43 years ago. Her smoking use included cigarettes. She started smoking about 45 years ago. She has a 0.2 pack-year smoking history. She has been exposed to tobacco smoke. She has never used smokeless tobacco. She reports that she does not drink alcohol and does not use  drugs. Her family history includes Alzheimer's disease in her maternal aunt and mother; Anxiety disorder in her mother; Atrial fibrillation in her mother; Breast cancer in her  maternal aunt; Cancer in her maternal grandfather; Diabetes in her maternal grandmother and mother; Heart disease in her father, maternal grandmother, mother, paternal grandfather, and paternal grandmother; Hyperlipidemia in her mother; Hypertension in her mother; Mitral valve prolapse in her sister; Obesity in her mother; Sleep apnea in her mother; Stroke in her mother; Sudden death in her father.   Current Medications:   Current Outpatient Medications (Endocrine & Metabolic):    insulin aspart (NOVOLOG) 100 UNIT/ML injection, Inject 7-10 Units into the skin 3 (three) times daily as needed for high blood sugar (Per SSC 7-10units when cbg above 120).   LANTUS SOLOSTAR 100 UNIT/ML Solostar Pen, Inject 30 Units into the skin at bedtime.   levothyroxine (SYNTHROID) 88 MCG tablet, Take 88 mcg by mouth daily.   MOUNJARO 5 MG/0.5ML Pen, Inject 5 mg into the skin once a week.   Current Outpatient Medications (Cardiovascular):    carvedilol (COREG) 3.125 MG tablet, TAKE 1 TABLET BY MOUTH TWICE A DAY   furosemide (LASIX) 40 MG tablet, Take 1 tablet by mouth for swelling and/or weight gain of 3 lbs overnight and 5 lbs in a week. (Patient taking differently: Take 20 mg by mouth as needed for edema or fluid. Take 1 tablet by mouth for swelling and/or weight gain of 3 lbs overnight and 5 lbs in a week.)   hydrochlorothiazide (HYDRODIURIL) 12.5 MG tablet, Take 1 tablet (12.5 mg total) by mouth daily. Take 1 Tablet Daily.   losartan (COZAAR) 25 MG tablet, Take 1 tablet (25 mg total) by mouth daily.   Current Outpatient Medications (Respiratory):    fexofenadine (ALLEGRA) 180 MG tablet, Take 180 mg by mouth daily as needed for allergies or rhinitis.     Current Outpatient Medications (Hematological):    XARELTO 20 MG TABS tablet, Take 1 tablet (20 mg total) by mouth daily.   Current Outpatient Medications (Other):    ciprofloxacin (CIPRO) 500 MG tablet, Take 1 tablet (500 mg total) by mouth 2 (two)  times daily.   metroNIDAZOLE (FLAGYL) 500 MG tablet, Take 1 tablet (500 mg total) by mouth 3 (three) times daily.   B-D ULTRAFINE III SHORT PEN 31G X 8 MM MISC, Inject into the skin 4 (four) times daily.   Cholecalciferol (VITAMIN D3) 2000 UNITS capsule, Take 4,000 Units by mouth daily with supper.   esomeprazole (NEXIUM) 40 MG capsule, Take 40 mg by mouth daily.   gabapentin (NEURONTIN) 100 MG capsule, Take 100 mg in the morning and 300 mg at bedtime   Menthol, Topical Analgesic, (BENGAY EX), Apply 1 application topically 2 (two) times daily as needed (pain).   Multiple Vitamin (MULTIVITAMIN) tablet, Take 1 tablet by mouth daily. Geritol   nystatin (MYCOSTATIN/NYSTOP) powder, Apply 1 Application topically 3 (three) times daily.   Facility-Administered Medications Ordered in Other Visits (Other):    technetium tetrofosmin (TC-MYOVIEW) injection 32.4 millicurie No current facility-administered medications for this visit.  Surgical History:  She  has a past surgical history that includes LAPAROSCOPY BILATERAL TUBAL LIGATION WITH FALOPE RINGS/  D&C HYSTEROSCOPY WITH POLYPECTOMY (12/06/2007); Colonoscopy (2013); Dilatation & curettage/hysteroscopy with myosure (N/A, 10/05/2016); LEFT HEART CATH AND CORONARY ANGIOGRAPHY (N/A, 01/03/2017); Bi-ventricular implantable cardioverter defibrillator  (crt-d) (06/26/2017); Dilation and curettage of uterus (12/06/2007); Tubal ligation (12/06/2007); BIV ICD INSERTION CRT-D (N/A, 06/26/2017); Cholecystectomy (N/A, 06/02/2021); Esophagogastroduodenoscopy (egd) with propofol (N/A,  05/21/2022); Colonoscopy with propofol (N/A, 05/21/2022); biopsy (05/21/2022); and polypectomy (05/21/2022).  Current Medications, Allergies, Past Medical History, Past Surgical History, Family History and Social History were reviewed in [employer] record.  Physical Exam: BP 110/68   Pulse 63   Ht 5\' 9"  (1.753 m)   Wt (!) 313 lb (142 kg)   LMP 08/07/2016   BMI 46.22  kg/m  General:   Pleasant,  obese female in no acute distress Heart : Regular rate and rhythm; no murmurs Pulm: Clear anteriorly; no wheezing Abdomen:  Soft, Obese AB, Active bowel sounds. mild tenderness LLQ. Without guarding and Without rebound, No organomegaly appreciated. Rectal: Not evaluated Extremities:  with  edema, stasis dermatitis. Neurologic:  Alert and  oriented x4;  No focal deficits.  Psych:  Cooperative. Normal mood and affect.   Doree Albee, PA-C 07/10/23

## 2023-07-09 NOTE — Progress Notes (Signed)
Remote ICD transmission.

## 2023-07-10 ENCOUNTER — Ambulatory Visit: Payer: 59 | Admitting: Physician Assistant

## 2023-07-10 ENCOUNTER — Ambulatory Visit
Admission: RE | Admit: 2023-07-10 | Discharge: 2023-07-10 | Disposition: A | Discharge status: Home / Self Care | Payer: 59 | Source: Ambulatory Visit | Attending: Physician Assistant | Admitting: Physician Assistant

## 2023-07-10 ENCOUNTER — Other Ambulatory Visit (INDEPENDENT_AMBULATORY_CARE_PROVIDER_SITE_OTHER): Payer: 59

## 2023-07-10 ENCOUNTER — Encounter: Payer: Self-pay | Admitting: Physician Assistant

## 2023-07-10 VITALS — BP 110/68 | HR 63 | Ht 69.0 in | Wt 313.0 lb

## 2023-07-10 DIAGNOSIS — K529 Noninfective gastroenteritis and colitis, unspecified: Secondary | ICD-10-CM

## 2023-07-10 DIAGNOSIS — K5730 Diverticulosis of large intestine without perforation or abscess without bleeding: Secondary | ICD-10-CM

## 2023-07-10 DIAGNOSIS — R1032 Left lower quadrant pain: Secondary | ICD-10-CM

## 2023-07-10 DIAGNOSIS — Z6841 Body Mass Index (BMI) 40.0 and over, adult: Secondary | ICD-10-CM

## 2023-07-10 LAB — CBC WITH DIFFERENTIAL/PLATELET
Basophils Absolute: 0 10*3/uL (ref 0.0–0.1)
Basophils Relative: 0.9 % (ref 0.0–3.0)
Eosinophils Absolute: 0.1 10*3/uL (ref 0.0–0.7)
Eosinophils Relative: 1.1 % (ref 0.0–5.0)
HCT: 48.1 % — ABNORMAL HIGH (ref 36.0–46.0)
Hemoglobin: 15.8 g/dL — ABNORMAL HIGH (ref 12.0–15.0)
Lymphocytes Relative: 26 % (ref 12.0–46.0)
Lymphs Abs: 1.5 10*3/uL (ref 0.7–4.0)
MCHC: 32.8 g/dL (ref 30.0–36.0)
MCV: 91.8 fl (ref 78.0–100.0)
Monocytes Absolute: 0.5 10*3/uL (ref 0.1–1.0)
Monocytes Relative: 8.9 % (ref 3.0–12.0)
Neutro Abs: 3.6 10*3/uL (ref 1.4–7.7)
Neutrophils Relative %: 63.1 % (ref 43.0–77.0)
Platelets: 246 10*3/uL (ref 150.0–400.0)
RBC: 5.24 Mil/uL — ABNORMAL HIGH (ref 3.87–5.11)
RDW: 13.4 % (ref 11.5–15.5)
WBC: 5.7 10*3/uL (ref 4.0–10.5)

## 2023-07-10 LAB — SEDIMENTATION RATE: Sed Rate: 32 mm/hr — ABNORMAL HIGH (ref 0–30)

## 2023-07-10 LAB — COMPREHENSIVE METABOLIC PANEL
ALT: 35 U/L (ref 0–35)
AST: 27 U/L (ref 0–37)
Albumin: 4.3 g/dL (ref 3.5–5.2)
Alkaline Phosphatase: 65 U/L (ref 39–117)
BUN: 17 mg/dL (ref 6–23)
CO2: 30 mEq/L (ref 19–32)
Calcium: 9.8 mg/dL (ref 8.4–10.5)
Chloride: 101 mEq/L (ref 96–112)
Creatinine, Ser: 0.95 mg/dL (ref 0.40–1.20)
GFR: 64.71 mL/min (ref >60.00)
Glucose, Bld: 112 mg/dL — ABNORMAL HIGH (ref 70–99)
Potassium: 3.9 mEq/L (ref 3.5–5.1)
Sodium: 140 mEq/L (ref 135–145)
Total Bilirubin: 1.3 mg/dL — ABNORMAL HIGH (ref 0.2–1.2)
Total Protein: 7.5 g/dL (ref 6.0–8.3)

## 2023-07-10 MED ORDER — CIPROFLOXACIN HCL 500 MG PO TABS
500.0000 mg | ORAL_TABLET | Freq: Two times a day (BID) | ORAL | 0 refills | Status: DC
Start: 2023-07-10 — End: 2023-08-08

## 2023-07-10 MED ORDER — METRONIDAZOLE 500 MG PO TABS: 500.0000 mg | ORAL_TABLET | Freq: Three times a day (TID) | ORAL | 0 refills | Status: DC

## 2023-07-10 NOTE — Patient Instructions (Addendum)
Your provider has requested that you go to the basement level for lab work / x-ray before leaving today. Press "B" on the elevator. The lab is located at the first door on the left as you exit the elevator.  Please call us back in a few months to get scheduled for a 6 month follow up.   Your provider has requested that you have an abdominal x ray before leaving today. Please go to the basement floor to our Radiology department for the test.  Will give Cipro and Flagyl  Can take dicyclomine at least 1-2 x a day for pain if needed.   Can do heating pad and can take tylenol max of 3000mg  a day.  Can add on lidocaine patches or voltern gel Go to the ER if unable to pass gas, severe AB pain, unable to hold down food, any shortness of breath of chest pain.   Diverticulitis Diverticulitis is inflammation or infection of small pouches in your colon that form when you have a condition called diverticulosis. The pouches in your colon are called diverticula. Your colon, or large intestine, is where water is absorbed and stool is formed. Complications of diverticulitis can include: Bleeding. Severe infection. Severe pain. Perforation of your colon. Obstruction of your colon.  What are the causes? Diverticulitis is caused by bacteria. Diverticulitis happens when stool becomes trapped in diverticula. This allows bacteria to grow in the diverticula, which can lead to inflammation and infection. What increases the risk? People with diverticulosis are at risk for diverticulitis. Eating a diet that does not include enough fiber from fruits and vegetables may make diverticulitis more likely to develop. What are the signs or symptoms? Symptoms of diverticulitis may include: Abdominal pain and tenderness. The pain is normally located on the left side of the abdomen, but may occur in other areas. Fever and chills. Bloating. Cramping. Nausea. Vomiting. Constipation. Diarrhea. Blood in your  stool.  How is this diagnosed? Your health care provider will ask you about your medical history and do a physical exam. You may need to have tests done because many medical conditions can cause the same symptoms as diverticulitis. Tests may include: Blood tests. Urine tests. Imaging tests of the abdomen, including X-rays and CT scans.  When your condition is under control, your health care provider may recommend that you have a colonoscopy. A colonoscopy can show how severe your diverticula are and whether something else is causing your symptoms. How is this treated? Most cases of diverticulitis are mild and can be treated at home. Treatment may include: Taking over-the-counter pain medicines. Following a clear liquid diet. Taking antibiotic medicines by mouth for 7-10 days.  More severe cases may be treated at a hospital. Treatment may include: Not eating or drinking. Taking prescription pain medicine. Receiving antibiotic medicines through an IV tube. Receiving fluids and nutrition through an IV tube. Surgery.  Follow these instructions at home: Follow your health care provider's instructions carefully. Follow a full liquid diet or other diet as directed by your health care provider. After your symptoms improve, your health care provider may tell you to change your diet. He or she may recommend you eat a high-fiber diet. Fruits and vegetables are good sources of fiber. Fiber makes it easier to pass stool. Take fiber supplements or probiotics as directed by your health care provider. Only take medicines as directed by your health care provider. Keep all your follow-up appointments. Contact a health care provider if: Your pain does not improve.  You have a hard time eating food. Your bowel movements do not return to normal. Get help right away if: Your pain becomes worse. Your symptoms do not get better. Your symptoms suddenly get worse. You have a fever. You have repeated  vomiting. You have bloody or black, tarry stools. This information is not intended to replace advice given to you by your health care provider. Make sure you discuss any questions you have with your health care provider. Document Released: 07/11/2005 Document Revised: 03/08/2016 Document Reviewed: 08/26/2013 Elsevier Interactive Patient Education  2017 [employer].

## 2023-07-10 NOTE — Progress Notes (Signed)
____________________________________________________________  Attending physician addendum:  Thank you for sending this case to me. I have reviewed the entire note and agree with the plan.  If she develops recurrent medicine induced constipation, which would seem most likely to be from dicyclomine, then I would favor a retrial of mesalamine.  Amada Jupiter, MD  ____________________________________________________________

## 2023-07-18 NOTE — Progress Notes (Signed)
Remote ICD transmission.   

## 2023-08-05 ENCOUNTER — Ambulatory Visit: Payer: 59 | Attending: Internal Medicine

## 2023-08-05 DIAGNOSIS — Z95810 Presence of automatic (implantable) cardiac defibrillator: Secondary | ICD-10-CM

## 2023-08-05 DIAGNOSIS — I5042 Chronic combined systolic (congestive) and diastolic (congestive) heart failure: Secondary | ICD-10-CM

## 2023-08-07 ENCOUNTER — Telehealth: Payer: Self-pay

## 2023-08-07 NOTE — Progress Notes (Signed)
EPIC Encounter for ICM Monitoring  Patient Name: Monica Solis is a 62 y.o. female Date: 08/07/2023 Primary Care Physican: [place], Sarah T, MD Primary Cardiologist: Allyson Sabal Electrophysiologist: Ladona Ridgel  02/21/2022 Weight: 330 - 335 lbs 07/11/2022 Weight: 342 lbs 01/03/2023 Office Weight: 340 lbs 05/28/2023 Weight: 320 lbs (started [facility] in April) 07/03/2023 Weight: 312 lbs   Attempted call to patient and unable to reach.  Left detailed message per DPR regarding transmission. Transmission reviewed.      HeartLogic HF Index is 3 suggesting fluid levels are within normal threshold range.     Prescribed:  Hydrochlorothiazide 12.5 mg Take 12.5 mg by mouth daily.  Furosemide 40 mg take 1 tablet (40 mg total) by mouth for swelling and/or weight gain of 3 lbs overnight and 5 lbs in a week.    Labs: 04/25/2023 Creatinine 0.84, BUN 16, Potassium 3.7, Sodium 142, GFR 79 01/03/2023 Creatinine 0.97, BUN 16, Potassium 4.9, Sodium 145, GFR 67 A complete set of results can be found in Results Review.   Recommendations:   Left voice mail with ICM number and encouraged to call if experiencing any fluid symptoms.   Follow-up plan: ICM clinic phone appointment on 09/16/2023.   91 day device clinic remote transmission 10/02/2023.              EP/Cardiology next office visit:  08/08/2023 with Francis Dowse, PA.   Recall 09/01/2023 with Marjie Skiff, PA.    Recall 07/25/2023 with Dr Ladona Ridgel.            Copy of ICM check sent to Dr. Ladona Ridgel.  3 Month HeartLogicT Heart Failure Index:    8 Day Data Trend:          Karie Soda, RN 08/07/2023 12:18 PM

## 2023-08-07 NOTE — Telephone Encounter (Signed)
Remote ICM transmission received.  Attempted call to patient regarding ICM remote transmission and left detailed message per DPR.  Left ICM phone number and advised to return call for any fluid symptoms or questions. Next ICM remote transmission scheduled 09/16/2023.

## 2023-08-07 NOTE — Progress Notes (Unsigned)
  Electrophysiology Office Note:   Date:  08/08/2023  ID:  Layanna Wellendorf, DOB December 26, 1960, MRN [id number]  Primary Cardiologist: Nanetta Batty, MD Electrophysiologist: Lewayne Bunting, MD       History of Present Illness:   Monica Solis is a 62 y.o. female with h/o NICM, sCHF s/p BiV ICD, Factor V Leiden on Xarelto, DVT/PE, HTN, HLD, DM, OSA (intolerant of CPAP), & obesity seen today for routine electrophysiology followup.   EP Clinic visit 07/2022 and was doing well at the time. Last remote device check 07/04/23 showed normal device function with appropriate histograms, leads/battery stable for patient per Dr. Ladona Ridgel.  Recently seen 07/10/23 by GI for colitis.   Since last being seen in our clinic the patient reports doing very well. She has lost ~ 40lbs on monjaro. She reports she can not see the difference yet but she feels better. She is starting to walk but notes pain in her hips at times.  She was recently taken off her entresto and put on losartan due to dizziness.   She denies chest pain, palpitations, dyspnea, PND, orthopnea, nausea, vomiting, dizziness, syncope, edema, weight gain, or early satiety.   Review of systems complete and found to be negative unless listed in HPI.   EP Information / Studies Reviewed:    EKG is not ordered today. EKG from 12/2022 reviewed which showed SR, VP at 71 bpm     ICD Interrogation-  reviewed in detail today,  See PACEART report.  Device History: [employer] BiV ICD implanted 06/26/2017 for NICM / chronic sCHF History of appropriate therapy: No History of AAD therapy: No   Studies:  LHC 12/2016 > normal coronary arteries and severely depressed LV function, NICM.   ECHO 02/2023 > LVEF 60-65%, no RWMA      Physical Exam:   VS:  BP 120/78   Pulse 70   Ht 5\' 9"  (1.753 m)   Wt (!) 309 lb (140.2 kg)   LMP 08/07/2016   SpO2 97%   BMI 45.63 kg/m    Wt Readings from Last 3 Encounters:  08/08/23 (!) 309 lb (140.2 kg)  07/10/23 (!) 313 lb  (142 kg)  03/05/23 (!) 334 lb 3.2 oz (151.6 kg)     GEN: Well nourished, well developed in no acute distress NECK: No JVD; No carotid bruits CARDIAC: Regular rate and rhythm, no murmurs, rubs, gallops RESPIRATORY:  Clear to auscultation without rales, wheezing or rhonchi  ABDOMEN: Soft, non-tender, non-distended EXTREMITIES:  No edema in LE's, No deformity  ASSESSMENT AND PLAN: /   Chronic Systolic Dysfunction / NICM s/p [employer] CRT-D  -euvolemic today -Stable on an appropriate medical regimen -Normal ICD function -See Pace Art report -No changes today -GDMT per primary Cardiology   HTN  -well controlled on current regimen   HLD -per primary Cardiology  -intolerant of statins, declined zetia.   PE/DVT Factor V Leiden Mutation  RLE DVT 2020.  -on Xarelto 20 mg, cr cl 144mL/min based on 07/09/23 labs   Disposition:   Follow up with Dr. Ladona Ridgel in 12 months   Signed, Canary Brim, MSN, APRN, NP-C, AGACNP-BC  [facility] - Elect[facility]iology  08/08/2023, 2:15 PM

## 2023-08-08 ENCOUNTER — Encounter: Payer: Self-pay | Admitting: Pulmonary Disease

## 2023-08-08 ENCOUNTER — Ambulatory Visit: Payer: 59 | Attending: Physician Assistant | Admitting: Pulmonary Disease

## 2023-08-08 VITALS — BP 120/78 | HR 70 | Ht 69.0 in | Wt 309.0 lb

## 2023-08-08 DIAGNOSIS — Z86718 Personal history of other venous thrombosis and embolism: Secondary | ICD-10-CM | POA: Diagnosis not present

## 2023-08-08 DIAGNOSIS — E785 Hyperlipidemia, unspecified: Secondary | ICD-10-CM

## 2023-08-08 DIAGNOSIS — I10 Essential (primary) hypertension: Secondary | ICD-10-CM

## 2023-08-08 DIAGNOSIS — D6851 Activated protein C resistance: Secondary | ICD-10-CM

## 2023-08-08 DIAGNOSIS — Z95810 Presence of automatic (implantable) cardiac defibrillator: Secondary | ICD-10-CM

## 2023-08-08 DIAGNOSIS — I428 Other cardiomyopathies: Secondary | ICD-10-CM | POA: Diagnosis not present

## 2023-08-08 LAB — CUP PACEART INCLINIC DEVICE CHECK
Date Time Interrogation Session: 20241024140943
HighPow Impedance: 84 Ohm
Implantable Lead Connection Status: 753985
Implantable Lead Connection Status: 753985
Implantable Lead Connection Status: 753985
Implantable Lead Implant Date: 20180912
Implantable Lead Implant Date: 20180912
Implantable Lead Implant Date: 20180912
Implantable Lead Location: 753858
Implantable Lead Location: 753859
Implantable Lead Location: 753860
Implantable Lead Model: 293
Implantable Lead Model: 4674
Implantable Lead Model: 7741
Implantable Lead Serial Number: 435131
Implantable Lead Serial Number: 813037
Implantable Lead Serial Number: 920623
Implantable Pulse Generator Implant Date: 20180912
Lead Channel Impedance Value: 634 Ohm
Lead Channel Impedance Value: 701 Ohm
Lead Channel Impedance Value: 814 Ohm
Lead Channel Pacing Threshold Amplitude: 0.9 V
Lead Channel Pacing Threshold Amplitude: 0.9 V
Lead Channel Pacing Threshold Amplitude: 0.9 V
Lead Channel Pacing Threshold Pulse Width: 0.4 ms
Lead Channel Pacing Threshold Pulse Width: 0.4 ms
Lead Channel Pacing Threshold Pulse Width: 0.4 ms
Lead Channel Sensing Intrinsic Amplitude: 2.9 mV
Lead Channel Sensing Intrinsic Amplitude: 23.2 mV
Lead Channel Sensing Intrinsic Amplitude: 25 mV
Lead Channel Setting Pacing Amplitude: 2 V
Lead Channel Setting Pacing Amplitude: 2.5 V
Lead Channel Setting Pacing Amplitude: 2.5 V
Lead Channel Setting Pacing Pulse Width: 0.4 ms
Lead Channel Setting Pacing Pulse Width: 0.4 ms
Lead Channel Setting Sensing Sensitivity: 0.5 mV
Lead Channel Setting Sensing Sensitivity: 1 mV
Pulse Gen Serial Number: 193993
Zone Setting Status: 755011

## 2023-08-08 NOTE — Patient Instructions (Signed)
Medication Instructions:  Your physician recommends that you continue on your current medications as directed. Please refer to the Current Medication list given to you today.  *If you need a refill on your cardiac medications before your next appointment, please call your pharmacy*   Lab Work: None ordered  If you have labs (blood work) drawn today and your tests are completely normal, you will receive your results only by: MyChart Message (if you have MyChart) OR A paper copy in the mail If you have any lab test that is abnormal or we need to change your treatment, we will call you to review the results.   Testing/Procedures: None ordered   Follow-Up: At [facility], you and your health needs are our priority.  As part of our continuing mission to provide you with exceptional heart care, we have created designated Provider Care Teams.  These Care Teams include your primary Cardiologist (physician) and Advanced Practice Providers (APPs -  Physician Assistants and Nurse Practitioners) who all work together to provide you with the care you need, when you need it.  We recommend signing up for the patient portal called "MyChart".  Sign up information is provided on this After Visit Summary.  MyChart is used to connect with patients for Virtual Visits (Telemedicine).  Patients are able to view lab/test results, encounter notes, upcoming appointments, etc.  Non-urgent messages can be sent to your provider as well.   To learn more about what you can do with MyChart, go to [email or web address].    Your next appointment:   12 month(s)  Provider:   Sharrell Ku, MD     Other Instructions

## 2023-09-03 NOTE — Progress Notes (Signed)
Cardiology Office Note:    Date:  09/10/2023   ID:  Monica Solis, DOB Sep 14, 1961, MRN [id number]  PCP:  [place], Sarah T, MD  Cardiologist:  Nanetta Batty, MD Electrophysiologist:  Lewayne Bunting, MD     Referring MD: [place], Sarah T, MD   Chief Complaint: routine follow-up of non-ischemic cardiomyopathy  History of Present Illness:    Monica Solis is a 62 y.o. female with a history of normal coronaries on cardiac catheterization in 2018, non-ischemic cardiomyopathy/ chronic combined CHF with EF as low as 30-35% in 11/2016 but normalized to 55-60% on last Echo in 12/2017, s/p [employer] ICD in 06/2017 for primary prevention of sudden cardiac death, LBBB, hypertension, hyperlipidemia, type 2 diabetes mellitus on insulin insulin, hypothyroidism, factor V Leiden deficiency with prior DVT and PE on Xarelto, and obesity who is followed by Dr. Allyson Sabal and Dr. Ladona Ridgel and presents today for follow-up of non-ischemic cardiomyopathy.  Patient was referred to Dr. Allyson Sabal in 10/2016 for pre-op evaluation prior to gastric bypass surgery. She denied any cardiac symptoms at that time but did have a strong family history of CAD. Lexiscan Myoview and Echo were ordered for further evaluation. Myoview showed no evidence of ischemia but LVEF was moderately reduced. Echo showed LVEF of 30-35% with diffuse hypokinesis but paradoxical septal motion consistent with known LBBB as well as mild LVH and grade 1 diastolic dysfunction.This led to a cardiac catheterization in 12/2016 which showed normal coronaries. She was started on GDMT but repeat Echo in 05/2017 unchanged EF of 30-35%. She was referred to EP and underwent placement of [employer] ICD in 06/2017. Repeat Echo later that month showed improved EF to 55-60% with normal wall motion, mild LVH, and grade 1 diastolic dysfunction.   She was seen by me in 02/2023 at which time she reported significant improvement in her dizziness and fatigue since stopping Entresto  and switching back to Losartan. She was last seen by Canary Brim, NP with EP, on 08/08/2023 at which time she was stable from a cardiac standpoint.   Patient presents today for follow-up. She is overall doing well. She continues to have some mild dizziness with position changes, mostly if she is doing a lot of repetitive bending over, but much improved since switching back to [place]. No falls or syncope. No chest pain. She reports some occasional shortness of breath if she does something overly strenuous but overall denies any dyspnea. No orthopnea, PND, or edema. No claudication. She has lost 26 lbs over the last 6 months which she attributes to [facility], diet changes, and some increase in physical activity.  EKGs/Labs/Other Studies Reviewed:    The following studies were reviewed:  Echocardiogram 12/03/2016: Study Conclusions: - Left ventricle: The cavity size was moderately dilated. There was    mild concentric hypertrophy. Systolic function was normal.    Diffuse hypokinesis. Doppler parameters are consistent with    abnormal left ventricular relaxation (grade 1 diastolic    dysfunction). Doppler parameters are consistent with elevated    ventricular end-diastolic filling pressure.  - Ventricular septum: Septal motion showed paradox.  - Aortic valve: There was no regurgitation.  - Ascending aorta: The ascending aorta was normal in size.  - Mitral valve: There was mild regurgitation.  - Left atrium: The atrium was moderately dilated.  - Right ventricle: Systolic function was normal.  - Tricuspid valve: There was trivial regurgitation.  - Pulmonary arteries: Systolic pressure was within the normal    range.  - Inferior vena  cava: The vessel was normal in size.   Impressions:  - LVEF is severely decreased at 30-35% with diffuse hypokinesis and    paradoxical septal motion.    RVEF is normal.  _______________   Left Cardiac Catheterization 01/03/2017: There is severe left  ventricular systolic dysfunction. LV end diastolic pressure is mildly elevated. The left ventricular ejection fraction is 25-35% by visual estimate.   Impressions: [name] Has normal coronary arteries and severely depressed LV function. She has a nonischemic cardiomyopathy. She'll need optimal medical management. The sheath was removed and a TR band was placed on the right wrist which is patent hemostasis. The patient left the lab in stable condition. She will be discharged home later today and will follow-up with me as an outpatient in 2-3 weeks.  _______________   Echocardiogram 03/01/2023: Impressions: 1. Left ventricular ejection fraction, by estimation, is 60 to 65%. The  left ventricle has normal function. The left ventricle has no regional  wall motion abnormalities. Left ventricular diastolic parameters were  normal.   2. Right ventricular systolic function is normal. The right ventricular  size is mildly enlarged. There is normal pulmonary artery systolic  pressure.   3. The mitral valve is normal in structure. No evidence of mitral valve  regurgitation. No evidence of mitral stenosis.   4. The aortic valve is normal in structure. Aortic valve regurgitation is  not visualized. No aortic stenosis is present.   5. The inferior vena cava is normal in size with greater than 50%  respiratory variability, suggesting right atrial pressure of 3 mmHg.  EKG:  EKG not ordered today.   Recent Labs: 07/10/2023: ALT 35; BUN 17; Creatinine, Ser 0.95; Hemoglobin 15.8; Platelets 246.0; Potassium 3.9; Sodium 140  Recent Lipid Panel    Component Value Date/Time   CHOL 233 (H) 12/27/2020 0943   TRIG 140 12/27/2020 0943   HDL 47 12/27/2020 0943   LDLCALC 161 (H) 12/27/2020 0943    Physical Exam:    Vital Signs: BP 102/62 (BP Location: Left Arm, Patient Position: Sitting, Cuff Size: Large)   Pulse 74   Ht 5\' 9"  (1.753 m)   Wt (!) 308 lb 12.8 oz (140.1 kg)   LMP 08/07/2016   SpO2 98%    BMI 45.60 kg/m     Wt Readings from Last 3 Encounters:  09/10/23 (!) 308 lb 12.8 oz (140.1 kg)  08/08/23 (!) 309 lb (140.2 kg)  07/10/23 (!) 313 lb (142 kg)     General: 62 y.o. obese Caucasian female in no acute distress. HEENT: Normocephalic and atraumatic. Sclera clear.  Neck: Supple. No carotid bruits. No JVD. Heart:  RRR. Distinct S1 and S2. No murmurs, gallops, or rubs.  Lungs: No increased work of breathing. Clear to ausculation bilaterally. No wheezes, rhonchi, or rales.  Extremities: No lower extremity edema.  Skin: Warm and dry. Neuro: No focal deficits. Psych: Normal affect. Responds appropriately.  Assessment:    1. Non-ischemic cardiomyopathy (HCC)   2. Chronic combined systolic and diastolic CHF (congestive heart failure) (HCC)   3. S/P ICD (internal cardiac defibrillator) procedure   4. Primary hypertension   5. Hyperlipidemia, unspecified hyperlipidemia type   6. Type 2 diabetes mellitus with morbid obesity (HCC)   7. Heterozygous factor V Leiden mutation (HCC)   8. History of DVT (deep vein thrombosis)   9. History of pulmonary embolus (PE)   10. Morbid obesity (HCC)     Plan:    Non-Ischemic Cardiomyopathy Chronic Combined CHF Initially  diagnosed in 11/2016 when found to have an EF of 30-35%. LHC at that time showed normal coronaries. She was started on GDMT with no improvement in EF. Most recent Echo on 03/01/2023 showed LVEF of 60-65% with normal wall motion and diastolic parameters, mildly enlarged RV wit normal systolic function, and no significant valvular disease.  - Euvolemic on exam.  - Continue Lasix 20mg  as needed for weight gain (3lbs in 1 day or 5lbs in 1 week) or worsening lower extremity edema.  - Continue Losartan 25mg  daily. Previously unable to tolerate Entresto due to significant dizziness and fatigue.  - Continue Coreg 3.125mg  twice daily.    S/p ICD S/p [employer] ICD in 06/2017 for primary prevention of sudden cardiac death given  non-ischemic cardiomyopathy. Last device interrogation in in 07/2023 showed LV pacing 98% of the time. - Followed by Dr. Ladona Ridgel.   Hypertension BP soft but stable. BP 102/62 today. She states systolic BP will occasionally be in the high 90s in the morning but will improve after a salty snack.  - Continue current medications: Losartan 25mg  daily,  Coreg 3.125mg  twice daily, and HCTZ 12.5mg  daily. - She does describe some dizziness with position changes that sounds consistent with orthostasis. Recommended  changing positions slowly, wearing compression stockings, and staying adequately hydrated. Advised patient to let us know if symptoms worsen or she is having more consistent systolic BP readings in the 90s - if that happens, will adjust medications (initially talked about switching her Coreg to Toprol-XL; however, the HCTZ is probably more of the culprit so would stop this first).   Hyperlipidemia Lipid panel in 04/2023: Total Cholesterol 189, Triglycerides 177, HDL 42, LDL 116. LDL down from 150 in 10/2022. - Intolerant to Rosuvastatin, Pravastatin and Nexletol in the past.  - Discussed referral to lipid clinic to discuss other options (possibly PCSK9 inhibitor) at last visit. However, she was hesitant given multiple medication intolerances. Discussed this again today but she would like to continue to hold off on this given LDL improved from 150 to 116 on Mounjaro alone.    Type 2 Diabetes Mellitus Hemoglobin A1c 5.3% in 07/2023. - On Mounjaro and  Insulin. - Management per PCP.   Factor 5 Leiden Deficiency History of DVT/ PE On chronic anticoagulation with Xarelto.  - Continue Xarelto 20mg  daily. She is compliant with this. - Management per PCP.   Morbid Obesity BMI 45.60. However, she has lost 26 lbs over the last 6 months which she attributes to [facility], diet changes, and some increase in physical activity. Congratulated patient on progress made so far.   Disposition: Follow up in 6  months.    Signed, Corrin Parker, PA-C  09/10/2023 1:21 PM     [facility]

## 2023-09-10 ENCOUNTER — Encounter: Payer: Self-pay | Admitting: Student

## 2023-09-10 ENCOUNTER — Ambulatory Visit: Payer: 59 | Attending: Student | Admitting: Student

## 2023-09-10 VITALS — BP 102/62 | HR 74 | Ht 69.0 in | Wt 308.8 lb

## 2023-09-10 DIAGNOSIS — D6851 Activated protein C resistance: Secondary | ICD-10-CM

## 2023-09-10 DIAGNOSIS — Z95810 Presence of automatic (implantable) cardiac defibrillator: Secondary | ICD-10-CM

## 2023-09-10 DIAGNOSIS — I428 Other cardiomyopathies: Secondary | ICD-10-CM

## 2023-09-10 DIAGNOSIS — Z86718 Personal history of other venous thrombosis and embolism: Secondary | ICD-10-CM

## 2023-09-10 DIAGNOSIS — E785 Hyperlipidemia, unspecified: Secondary | ICD-10-CM

## 2023-09-10 DIAGNOSIS — Z86711 Personal history of pulmonary embolism: Secondary | ICD-10-CM

## 2023-09-10 DIAGNOSIS — E1169 Type 2 diabetes mellitus with other specified complication: Secondary | ICD-10-CM

## 2023-09-10 DIAGNOSIS — I10 Essential (primary) hypertension: Secondary | ICD-10-CM | POA: Diagnosis not present

## 2023-09-10 DIAGNOSIS — I5042 Chronic combined systolic (congestive) and diastolic (congestive) heart failure: Secondary | ICD-10-CM

## 2023-09-10 NOTE — Patient Instructions (Signed)
Medication Instructions:  STOP 40 MG LASIX , TAKE 20 MG AS NEEDED *If you need a refill on your cardiac medications before your next appointment, please call your pharmacy*   Lab Work: NO LABS If you have labs (blood work) drawn today and your tests are completely normal, you will receive your results only by: MyChart Message (if you have MyChart) OR A paper copy in the mail If you have any lab test that is abnormal or we need to change your treatment, we will call you to review the results.   Testing/Procedures: NO TESTING   Follow-Up: At [facility], you and your health needs are our priority.  As part of our continuing mission to provide you with exceptional heart care, we have created designated Provider Care Teams.  These Care Teams include your primary Cardiologist (physician) and Advanced Practice Providers (APPs -  Physician Assistants and Nurse Practitioners) who all work together to provide you with the care you need, when you need it.   Your next appointment:   6 month(s)  Provider:   Nanetta Batty, MD   Other Instructions CONTACT OFFICE IF YOU CONTINUE TO HAVE DIZZINESS

## 2023-09-16 ENCOUNTER — Ambulatory Visit: Payer: 59 | Attending: Internal Medicine

## 2023-09-16 DIAGNOSIS — Z95810 Presence of automatic (implantable) cardiac defibrillator: Secondary | ICD-10-CM | POA: Diagnosis not present

## 2023-09-16 DIAGNOSIS — I5042 Chronic combined systolic (congestive) and diastolic (congestive) heart failure: Secondary | ICD-10-CM | POA: Diagnosis not present

## 2023-09-20 ENCOUNTER — Telehealth: Payer: Self-pay

## 2023-09-20 NOTE — Progress Notes (Signed)
EPIC Encounter for ICM Monitoring  Patient Name: Monica Solis is a 62 y.o. female Date: 09/20/2023 Primary Care Physican: [place], Sarah T, MD Primary Cardiologist: Allyson Sabal Electrophysiologist: Ladona Ridgel  02/21/2022 Weight: 330 - 335 lbs 07/11/2022 Weight: 342 lbs 01/03/2023 Office Weight: 340 lbs 05/28/2023 Weight: 320 lbs (started [facility] in April) 07/03/2023 Weight: 312 lbs   Attempted call to patient and unable to reach.  Left detailed message per DPR regarding transmission.  Transmission results reviewed.    HeartLogic HF Index is 0 suggesting fluid levels are within normal threshold range.     Prescribed:  Hydrochlorothiazide 12.5 mg Take 12.5 mg by mouth daily.  Furosemide 40 mg take 1 tablet (40 mg total) by mouth for swelling and/or weight gain of 3 lbs overnight and 5 lbs in a week.    Labs: 07/10/2023 Creatinine 0.95, BUN 17, Potassium 3.9, Sodium 140, GFR 64.71 04/25/2023 Creatinine 0.84, BUN 16, Potassium 3.7, Sodium 142, GFR 79 01/03/2023 Creatinine 0.97, BUN 16, Potassium 4.9, Sodium 145, GFR 67 A complete set of results can be found in Results Review.   Recommendations:   No changes and encouraged to call if experiencing any fluid symptoms.   Follow-up plan: ICM clinic phone appointment on 10/21/2023.   91 day device clinic remote transmission 10/02/2023.              EP/Cardiology next office visit:   Recall 03/08/2024 with Dr Allyson Sabal.    Recall 08/02/2024 with Dr Ladona Ridgel.            Copy of ICM check sent to Dr. Ladona Ridgel.  3 Month HeartLogicT Heart Failure Index:    8 Day Data Trend:          Karie Soda, RN 09/20/2023 8:27 AM

## 2023-09-20 NOTE — Telephone Encounter (Signed)
 Remote ICM transmission received.  Attempted call to patient regarding ICM remote transmission and left detailed message per DPR.  Left ICM phone number and advised to return call for any fluid symptoms or questions. Next ICM remote transmission scheduled 10/21/2023.

## 2023-10-02 ENCOUNTER — Ambulatory Visit (INDEPENDENT_AMBULATORY_CARE_PROVIDER_SITE_OTHER): Payer: 59

## 2023-10-02 DIAGNOSIS — I5042 Chronic combined systolic (congestive) and diastolic (congestive) heart failure: Secondary | ICD-10-CM | POA: Diagnosis not present

## 2023-10-02 DIAGNOSIS — I428 Other cardiomyopathies: Secondary | ICD-10-CM

## 2023-10-02 LAB — CUP PACEART REMOTE DEVICE CHECK
Battery Remaining Longevity: 78 mo
Battery Remaining Percentage: 87 %
Brady Statistic RA Percent Paced: 0 %
Brady Statistic RV Percent Paced: 0 %
Date Time Interrogation Session: 20241218005100
HighPow Impedance: 68 Ohm
Implantable Lead Connection Status: 753985
Implantable Lead Connection Status: 753985
Implantable Lead Connection Status: 753985
Implantable Lead Implant Date: 20180912
Implantable Lead Implant Date: 20180912
Implantable Lead Implant Date: 20180912
Implantable Lead Location: 753858
Implantable Lead Location: 753859
Implantable Lead Location: 753860
Implantable Lead Model: 293
Implantable Lead Model: 4674
Implantable Lead Model: 7741
Implantable Lead Serial Number: 435131
Implantable Lead Serial Number: 813037
Implantable Lead Serial Number: 920623
Implantable Pulse Generator Implant Date: 20180912
Lead Channel Impedance Value: 496 Ohm
Lead Channel Impedance Value: 601 Ohm
Lead Channel Impedance Value: 730 Ohm
Lead Channel Setting Pacing Amplitude: 2 V
Lead Channel Setting Pacing Amplitude: 2.5 V
Lead Channel Setting Pacing Amplitude: 2.5 V
Lead Channel Setting Pacing Pulse Width: 0.4 ms
Lead Channel Setting Pacing Pulse Width: 0.4 ms
Lead Channel Setting Sensing Sensitivity: 0.5 mV
Lead Channel Setting Sensing Sensitivity: 1 mV
Pulse Gen Serial Number: 193993
Zone Setting Status: 755011

## 2023-10-21 ENCOUNTER — Telehealth: Payer: Self-pay | Admitting: Student

## 2023-10-21 ENCOUNTER — Ambulatory Visit: Payer: 59 | Attending: Internal Medicine

## 2023-10-21 DIAGNOSIS — I5042 Chronic combined systolic (congestive) and diastolic (congestive) heart failure: Secondary | ICD-10-CM | POA: Diagnosis not present

## 2023-10-21 DIAGNOSIS — Z95810 Presence of automatic (implantable) cardiac defibrillator: Secondary | ICD-10-CM | POA: Diagnosis not present

## 2023-10-21 MED ORDER — METOPROLOL SUCCINATE ER 25 MG PO TB24: 12.5000 mg | ORAL_TABLET | Freq: Every day | ORAL | 11 refills | Status: DC

## 2023-10-21 NOTE — Telephone Encounter (Signed)
 Agree. Thank you Susa Day!

## 2023-10-21 NOTE — Telephone Encounter (Signed)
 Pt c/o medication issue:  1. Name of Medication:   MOUNJARO  5 MG/0.5ML Pen    2. How are you currently taking this medication (dosage and times per day)?   Inject 5 mg into the skin once a week    3. Are you having a reaction (difficulty breathing--STAT)? No  4. What is your medication issue? Pt states that medication is causing dizziness as well as low BP readings

## 2023-10-21 NOTE — Telephone Encounter (Signed)
 Called and spoke to patient. Verified name and DOB. Patient report her BP has been low since her last office visit. She stated she think it may be coming from the Mounjaro . She also report dizziness but no other symptoms at this time.She states she stopped taking the hydrochlorothiazide  about 2 weeks ago because her BP was low. Please advise.  BP readings:   1/2  85/67 HR 86 1/3  94/70  HR 89 1/4  90/69  HR 74 1/5  103/61 HR 73 1/6  107/64  HR 73

## 2023-10-21 NOTE — Telephone Encounter (Addendum)
 Spoke to patient, gets dizzy around house and afraid of falling. Will switch carvedilol  low dose to low dose metoprolol  so that she has less effect on her BP. Prescription for metoprolol  XL 25 mg - 1/2 tab daily sent to the pharmacy   Patient verbalizes understanding that metoprolol  12.5 mg is the replacement of Carvedilol  3.125 mg twice daily. Will start taking metoprolol  from tomorrow morning.

## 2023-10-25 NOTE — Progress Notes (Signed)
 EPIC Encounter for ICM Monitoring  Patient Name: Monica Solis is a 63 y.o. female Date: 10/25/2023 Primary Care Physican: Jordan, Sarah T, MD Primary Cardiologist: Court Electrophysiologist: Waddell  02/21/2022 Weight: 330 - 335 lbs 07/11/2022 Weight: 342 lbs 01/03/2023 Office Weight: 340 lbs 05/28/2023 Weight: 320 lbs (started Mounjaro  in April) 07/03/2023 Weight: 312 lbs 10/25/2023 Weight: 297 lbs   Spoke with patient and heart failure questions reviewed.  Transmission results reviewed.  Pt asymptomatic for fluid accumulation.  Reports feeling well at this time and voices no complaints.     HeartLogic HF Index is 0 suggesting fluid levels are within normal threshold range.     Prescribed:  Hydrochlorothiazide  12.5 mg Take 12.5 mg by mouth daily.  Furosemide  40 mg take 1 tablet (40 mg total) by mouth for swelling and/or weight gain of 3 lbs overnight and 5 lbs in a week.    Labs: 07/10/2023 Creatinine 0.95, BUN 17, Potassium 3.9, Sodium 140, GFR 64.71 04/25/2023 Creatinine 0.84, BUN 16, Potassium 3.7, Sodium 142, GFR 79 01/03/2023 Creatinine 0.97, BUN 16, Potassium 4.9, Sodium 145, GFR 67 A complete set of results can be found in Results Review.   Recommendations:   No changes and encouraged to call if experiencing any fluid symptoms.   Follow-up plan: ICM clinic phone appointment on 11/25/2023.   91 day device clinic remote transmission 12/30/2023.              EP/Cardiology next office visit:   Recall 03/08/2024 with Dr Court.    Recall 08/02/2024 with Dr Waddell.            Copy of ICM check sent to Dr. Waddell.  3 Month HeartLogicT Heart Failure Index:    8 Day Data Trend:          Mitzie GORMAN Garner, RN 10/25/2023 10:26 AM

## 2023-11-11 ENCOUNTER — Other Ambulatory Visit (HOSPITAL_COMMUNITY)
Admission: RE | Admit: 2023-11-11 | Discharge: 2023-11-11 | Disposition: A | Discharge status: Home / Self Care | Payer: 59 | Source: Ambulatory Visit | Attending: Nurse Practitioner | Admitting: Nurse Practitioner

## 2023-11-11 ENCOUNTER — Ambulatory Visit (INDEPENDENT_AMBULATORY_CARE_PROVIDER_SITE_OTHER): Payer: 59 | Admitting: Nurse Practitioner

## 2023-11-11 ENCOUNTER — Encounter: Payer: Self-pay | Admitting: Nurse Practitioner

## 2023-11-11 VITALS — BP 102/68 | HR 93 | Ht 67.72 in | Wt 300.0 lb

## 2023-11-11 DIAGNOSIS — Z01419 Encounter for gynecological examination (general) (routine) without abnormal findings: Secondary | ICD-10-CM

## 2023-11-11 DIAGNOSIS — Z124 Encounter for screening for malignant neoplasm of cervix: Secondary | ICD-10-CM | POA: Insufficient documentation

## 2023-11-11 DIAGNOSIS — N762 Acute vulvitis: Secondary | ICD-10-CM

## 2023-11-11 DIAGNOSIS — L292 Pruritus vulvae: Secondary | ICD-10-CM | POA: Diagnosis not present

## 2023-11-11 DIAGNOSIS — Z780 Asymptomatic menopausal state: Secondary | ICD-10-CM | POA: Diagnosis not present

## 2023-11-11 LAB — WET PREP FOR TRICH, YEAST, CLUE

## 2023-11-11 MED ORDER — CLOBETASOL PROPIONATE 0.05 % EX OINT: 1.0000 | TOPICAL_OINTMENT | Freq: Two times a day (BID) | CUTANEOUS | 0 refills | Status: DC

## 2023-11-11 NOTE — Progress Notes (Signed)
Monica Solis [birth year] [id number]   History:  63 y.o. G1P0010 presents for annual exam. Postmenopausal - No HRT, no bleeding. Normal pap and mammogram history. History of DTV, HTN, CHF, DM, hypothyroidism, factor IV Leiden, HLD. Complains of intermittent vulvar itching without discharge or odor.   Gynecologic History Patient's last menstrual period was 08/07/2016.   Contraception/Family planning: post menopausal status and tubal ligation Sexually active: No  Health Maintenance Last Pap: 12/23/2018. Results were: Normal neg HPV Last mammogram: 11/01/2022. Results were: Normal Last colonoscopy: 05/21/2022. Results were: Tubular adenoma, 7-year recall Last Dexa: Never Exercising: No Smoker: no   Past medical history, past surgical history, family history and social history were all reviewed and documented in the EPIC chart. Married. Retired Firefighter.   ROS:  A ROS was performed and pertinent positives and negatives are included.  Exam:  Vitals:   11/11/23 1002  BP: 102/68  Pulse: 93  SpO2: 98%  Weight: 300 lb (136.1 kg)  Height: 5' 7.72" (1.72 m)     Body mass index is 46 kg/m.  General appearance:  Normal Thyroid:  Symmetrical, normal in size, without palpable masses or nodularity. Respiratory  Auscultation:  Clear without wheezing or rhonchi Cardiovascular  Auscultation:  Regular rate, without rubs, murmurs or gallops  Edema/varicosities:  Not grossly evident Abdominal  Soft,nontender, without masses, guarding or rebound.  Liver/spleen:  No organomegaly noted  Hernia:  None appreciated  Skin  Inspection:  Redness under abdominal folds and in groin c/w yeast Breasts: Examined lying and sitting.   Right: Without masses, retractions, nipple discharge or axillary adenopathy.   Left: Without masses, retractions, nipple discharge or axillary adenopathy. Pelvic: External genitalia: Generalized areas of hypopigmentation, some waxy in appearance from clitoral  hood to anus              Urethra:  normal appearing urethra with no masses, tenderness or lesions              Bartholins and Skenes: normal                 Vagina: normal appearing vagina with normal color and discharge, no lesions              Cervix: no lesions Bimanual Exam:  Uterus:  no masses or tenderness              Adnexa: no mass, fullness, tenderness              Rectovaginal: Deferred              Anus:  normal, no lesions  Patient informed chaperone available to be present for breast and pelvic exam. Patient has requested no chaperone to be present. Patient has been advised what will be completed during breast and pelvic exam.   Wet prep negative for pathogens  Assessment/Plan:  63 y.o. G1P0010 for annual exam.   Well female exam with routine gynecological exam - Education provided on SBEs, importance of preventative screenings, current guidelines, high calcium diet, regular exercise, and multivitamin daily. Labs done elsewhere.   Postmenopausal - no HRT, no bleeding.  Cervical cancer screening - Plan: Cytology - PAP( [place]). Normal pap history.   Vulvar itching - Plan: WET PREP FOR TRICH, YEAST, CLUE. Negative wet prep.  Acute vulvitis - Plan: clobetasol ointment (TEMOVATE) 0.05 % BID x 2 weeks, then twice weekly. Possible lichen sclerosus. If no improvement, biopsy recommended.   Screening for breast cancer - Normal mammogram history. Continue  annual screenings. Normal breast exam today.  Screening for colon cancer - 05/2022 colonoscopy. Will repeat at 7-year interval per GI's recommendation.   Screening for osteoporosis - Average risk. Will plan for DXA at age 6.   Return in about 4 weeks (around 12/09/2023) for follow up, 1 year for annual.    Olivia Mackie DNP, 11:10 AM 11/11/2023

## 2023-11-11 NOTE — Progress Notes (Signed)
Remote ICD transmission.

## 2023-11-13 ENCOUNTER — Encounter: Payer: Self-pay | Admitting: Nurse Practitioner

## 2023-11-13 LAB — CYTOLOGY - PAP
Adequacy: ABSENT
Comment: NEGATIVE
Diagnosis: NEGATIVE
High risk HPV: NEGATIVE

## 2023-11-25 ENCOUNTER — Ambulatory Visit: Payer: 59 | Attending: Internal Medicine

## 2023-11-25 DIAGNOSIS — Z95810 Presence of automatic (implantable) cardiac defibrillator: Secondary | ICD-10-CM | POA: Diagnosis not present

## 2023-11-25 DIAGNOSIS — I5042 Chronic combined systolic (congestive) and diastolic (congestive) heart failure: Secondary | ICD-10-CM

## 2023-11-29 NOTE — Progress Notes (Signed)
EPIC Encounter for ICM Monitoring  Patient Name: Monica Solis is a 63 y.o. female Date: 11/29/2023 Primary Care Physican: [place], Sarah T, MD Primary Cardiologist: Allyson Sabal Electrophysiologist: Ladona Ridgel  02/21/2022 Weight: 330 - 335 lbs 07/11/2022 Weight: 342 lbs 01/03/2023 Office Weight: 340 lbs 05/28/2023 Weight: 320 lbs (started [facility] in April) 07/03/2023 Weight: 312 lbs 10/25/2023 Weight: 297 lbs 11/29/2023 Weight: 295 lbs   Spoke with patient and heart failure questions reviewed.  Transmission results reviewed.  Pt asymptomatic for fluid accumulation.  Reports feeling well at this time and voices no complaints.     HeartLogic HF Index is 0 suggesting fluid levels are within normal threshold range.     Prescribed:  Hydrochlorothiazide 12.5 mg Take 12.5 mg by mouth daily.  Her PCP has discontinued at this point.   Furosemide 20 mg take 1 tablet (20 mg total) by mouth as needed.  Take 1 tablet for swelling and/or weight gain of 3 lbs overnight and 5 lbs in a week.    Labs: 07/10/2023 Creatinine 0.95, BUN 17, Potassium 3.9, Sodium 140, GFR 64.71 04/25/2023 Creatinine 0.84, BUN 16, Potassium 3.7, Sodium 142, GFR 79 01/03/2023 Creatinine 0.97, BUN 16, Potassium 4.9, Sodium 145, GFR 67 A complete set of results can be found in Results Review.   Recommendations:   No changes and encouraged to call if experiencing any fluid symptoms.   Follow-up plan: ICM clinic phone appointment on 01/06/2024.   91 day device clinic remote transmission 01/01/2024.              EP/Cardiology next office visit:   Recall 03/08/2024 with Dr Allyson Sabal.    Recall 08/02/2024 with Dr Ladona Ridgel.            Copy of ICM check sent to Dr. Ladona Ridgel.  3 Month HeartLogicT Heart Failure Index:    8 Day Data Trend:          Karie Soda, RN 11/29/2023 4:24 PM

## 2023-12-09 ENCOUNTER — Other Ambulatory Visit (HOSPITAL_COMMUNITY)
Admission: RE | Admit: 2023-12-09 | Discharge: 2023-12-09 | Disposition: A | Discharge status: Home / Self Care | Payer: 59 | Source: Ambulatory Visit | Attending: Nurse Practitioner | Admitting: Nurse Practitioner

## 2023-12-09 ENCOUNTER — Ambulatory Visit: Payer: 59 | Admitting: Nurse Practitioner

## 2023-12-09 ENCOUNTER — Encounter: Payer: Self-pay | Admitting: Nurse Practitioner

## 2023-12-09 VITALS — BP 110/78 | HR 76

## 2023-12-09 DIAGNOSIS — N763 Subacute and chronic vulvitis: Secondary | ICD-10-CM | POA: Insufficient documentation

## 2023-12-09 DIAGNOSIS — N9089 Other specified noninflammatory disorders of vulva and perineum: Secondary | ICD-10-CM

## 2023-12-09 NOTE — Progress Notes (Signed)
   Acute Office Visit  Subjective:    Patient ID: Monica Solis, female    DOB: 08/17/1961, 63 y.o.   MRN: [id number]   HPI 63 y.o. presents today for 4-week follow up on chronic vulvitis. Seen 11/11/23 with complaints of intermittent vulvar itching. On exam there were areas of hypopigmentation extending from clitoral hood to anus. Started Clobetasol. Does feel itching has been a little better.  Patient's last menstrual period was 08/07/2016.    Review of Systems  Constitutional: Negative.   Genitourinary:  Positive for genital sores.       Objective:    Physical Exam Constitutional:      Appearance: Normal appearance.  Genitourinary:      BP 110/78   Pulse 76   LMP 08/07/2016   SpO2 98%  Wt Readings from Last 3 Encounters:  11/11/23 300 lb (136.1 kg)  09/10/23 (!) 308 lb 12.8 oz (140.1 kg)  08/08/23 (!) 309 lb (140.2 kg)        Patient informed chaperone available to be present for breast and/or pelvic exam. Patient has requested no chaperone to be present. Patient has been advised what will be completed during breast and pelvic exam.   Assessment & Plan:   Problem List Items Addressed This Visit   None Visit Diagnoses       Vulvar lesion    -  Primary   Relevant Orders   Surgical pathology     Chronic vulvitis       Relevant Orders   Surgical pathology      Procedure note: Punch biopsy of perineum  Time out performed and consent obtained.   Area at right perineum cleansed with Betadine and 1% Lidocaine without epinephrine used for anesthetic. With sterile technique a 3 mm punch biopsy was used to obtain a biopsy specimen of the lesion. Hemostasis was obtained by pressure and silver nitrate. Wound care instructions provided. Be alert for any signs of cutaneous infection. The specimen is labeled and sent to pathology for evaluation. The procedure was well tolerated without complications.  On Xarelto - aware to apply pressure if bleeding returns.    Follow up to be determined.     Olivia Mackie DNP, 11:35 AM 12/09/2023

## 2023-12-12 LAB — SURGICAL PATHOLOGY

## 2023-12-16 ENCOUNTER — Encounter: Payer: Self-pay | Admitting: Nurse Practitioner

## 2023-12-16 ENCOUNTER — Other Ambulatory Visit: Payer: Self-pay | Admitting: Nurse Practitioner

## 2023-12-16 DIAGNOSIS — N762 Acute vulvitis: Secondary | ICD-10-CM

## 2023-12-16 MED ORDER — CLOBETASOL PROPIONATE 0.05 % EX OINT: 1.0000 | TOPICAL_OINTMENT | Freq: Two times a day (BID) | CUTANEOUS | 2 refills | Status: AC

## 2023-12-23 ENCOUNTER — Encounter: Payer: Self-pay | Admitting: Nurse Practitioner

## 2024-01-01 ENCOUNTER — Ambulatory Visit (INDEPENDENT_AMBULATORY_CARE_PROVIDER_SITE_OTHER): Payer: 59

## 2024-01-01 DIAGNOSIS — I428 Other cardiomyopathies: Secondary | ICD-10-CM

## 2024-01-06 ENCOUNTER — Ambulatory Visit: Payer: 59 | Attending: Internal Medicine

## 2024-01-06 ENCOUNTER — Encounter: Payer: Self-pay | Admitting: Internal Medicine

## 2024-01-06 DIAGNOSIS — Z95810 Presence of automatic (implantable) cardiac defibrillator: Secondary | ICD-10-CM

## 2024-01-06 DIAGNOSIS — I5042 Chronic combined systolic (congestive) and diastolic (congestive) heart failure: Secondary | ICD-10-CM

## 2024-01-06 LAB — CUP PACEART REMOTE DEVICE CHECK
Battery Remaining Longevity: 72 mo
Battery Remaining Percentage: 83 %
Brady Statistic RA Percent Paced: 0 %
Brady Statistic RV Percent Paced: 0 %
Date Time Interrogation Session: 20250319010300
HighPow Impedance: 78 Ohm
Implantable Lead Connection Status: 753985
Implantable Lead Connection Status: 753985
Implantable Lead Connection Status: 753985
Implantable Lead Implant Date: 20180912
Implantable Lead Implant Date: 20180912
Implantable Lead Implant Date: 20180912
Implantable Lead Location: 753858
Implantable Lead Location: 753859
Implantable Lead Location: 753860
Implantable Lead Model: 293
Implantable Lead Model: 4674
Implantable Lead Model: 7741
Implantable Lead Serial Number: 435131
Implantable Lead Serial Number: 813037
Implantable Lead Serial Number: 920623
Implantable Pulse Generator Implant Date: 20180912
Lead Channel Impedance Value: 592 Ohm
Lead Channel Impedance Value: 636 Ohm
Lead Channel Impedance Value: 738 Ohm
Lead Channel Setting Pacing Amplitude: 2 V
Lead Channel Setting Pacing Amplitude: 2.5 V
Lead Channel Setting Pacing Amplitude: 2.5 V
Lead Channel Setting Pacing Pulse Width: 0.4 ms
Lead Channel Setting Pacing Pulse Width: 0.4 ms
Lead Channel Setting Sensing Sensitivity: 0.5 mV
Lead Channel Setting Sensing Sensitivity: 1 mV
Pulse Gen Serial Number: 193993
Zone Setting Status: 755011

## 2024-01-08 NOTE — Progress Notes (Signed)
 EPIC Encounter for ICM Monitoring  Patient Name: Monica Solis is a 63 y.o. female Date: 01/08/2024 Primary Care Physican: [place], Sarah T, MD Primary Cardiologist: Allyson Sabal Electrophysiologist: Ladona Ridgel  02/21/2022 Weight: 330 - 335 lbs 07/11/2022 Weight: 342 lbs 01/03/2023 Office Weight: 340 lbs 05/28/2023 Weight: 320 lbs (started [facility] in April) 07/03/2023 Weight: 312 lbs 10/25/2023 Weight: 297 lbs 11/29/2023 Weight: 295 lbs 01/08/2024 Weight: 293-295 lbs   Spoke with patient and heart failure questions reviewed.  Transmission results reviewed.  Pt asymptomatic for fluid accumulation.  Reports feeling well at this time and voices no complaints.  She occasionally has some low sugar levels at night.    HeartLogic HF Index is 4 suggesting fluid levels are within normal threshold range.     Prescribed:  Hydrochlorothiazide 12.5 mg Take 12.5 mg by mouth daily.     Furosemide 20 mg take 1 tablet (20 mg total) by mouth as needed.  Take 1 tablet for swelling and/or weight gain of 3 lbs overnight and 5 lbs in a week.    Labs: 07/10/2023 Creatinine 0.95, BUN 17, Potassium 3.9, Sodium 140, GFR 64.71 04/25/2023 Creatinine 0.84, BUN 16, Potassium 3.7, Sodium 142, GFR 79 01/03/2023 Creatinine 0.97, BUN 16, Potassium 4.9, Sodium 145, GFR 67 A complete set of results can be found in Results Review.   Recommendations:   No changes and encouraged to call if experiencing any fluid symptoms.   Follow-up plan: ICM clinic phone appointment on 02/10/2024.   91 day device clinic remote transmission 04/01/2024.              EP/Cardiology next office visit:   Advised to call Dr Hazle Coca office for appt.  Recall 03/08/2024 with Dr Allyson Sabal.    Recall 08/02/2024 with Dr Ladona Ridgel.            Copy of ICM check sent to Dr. Ladona Ridgel.  3 Month HeartLogicT Heart Failure Index:    8 Day Data Trend:          Karie Soda, RN 01/08/2024 10:12 AM

## 2024-02-10 ENCOUNTER — Ambulatory Visit: Attending: Internal Medicine

## 2024-02-10 DIAGNOSIS — I5042 Chronic combined systolic (congestive) and diastolic (congestive) heart failure: Secondary | ICD-10-CM | POA: Diagnosis not present

## 2024-02-10 DIAGNOSIS — Z95810 Presence of automatic (implantable) cardiac defibrillator: Secondary | ICD-10-CM | POA: Diagnosis not present

## 2024-02-11 NOTE — Progress Notes (Signed)
 EPIC Encounter for ICM Monitoring  Patient Name: Monica Solis is a 63 y.o. female Date: 02/11/2024 Primary Care Physican: [place], Sarah T, MD Primary Cardiologist: Katheryne Pane Electrophysiologist: Carolynne Citron  02/21/2022 Weight: 330 - 335 lbs 07/11/2022 Weight: 342 lbs 01/03/2023 Office Weight: 340 lbs 05/28/2023 Weight: 320 lbs (started [facility] in April) 07/03/2023 Weight: 312 lbs 10/25/2023 Weight: 297 lbs 11/29/2023 Weight: 295 lbs 01/08/2024 Weight: 293-295 lbs 02/11/2024 Weight: 295-296 lbs   Spoke with patient and heart failure questions reviewed.  Transmission results reviewed.  Pt asymptomatic for fluid accumulation.  Reports feeling well at this time and voices no complaints.   Has been planting a lot of flowers in her yard.   HeartLogic HF Index is 1 suggesting fluid levels are within normal threshold range.     Prescribed:  Hydrochlorothiazide  12.5 mg Take 12.5 mg by mouth daily.     Furosemide  20 mg take 1 tablet (20 mg total) by mouth as needed.  Take 1 tablet for swelling and/or weight gain of 3 lbs overnight and 5 lbs in a week.    Labs: 07/10/2023 Creatinine 0.95, BUN 17, Potassium 3.9, Sodium 140, GFR 64.71 04/25/2023 Creatinine 0.84, BUN 16, Potassium 3.7, Sodium 142, GFR 79 01/03/2023 Creatinine 0.97, BUN 16, Potassium 4.9, Sodium 145, GFR 67 A complete set of results can be found in Results Review.   Recommendations:   No changes and encouraged to call if experiencing any fluid symptoms.   Follow-up plan: ICM clinic phone appointment on 03/16/2024.   91 day device clinic remote transmission 04/01/2024.              EP/Cardiology next office visit:   02/26/2024 with Dr Katheryne Pane.    Recall 08/02/2024 with Dr Carolynne Citron.            Copy of ICM check sent to Dr. Carolynne Citron.  3 Month HeartLogicT Heart Failure Index:    8 Day Data Trend:          Almyra Jain, RN 02/11/2024 11:02 AM

## 2024-02-14 NOTE — Addendum Note (Signed)
 Addended by: Lott Rouleau A on: 02/14/2024 11:56 AM   Modules accepted: Orders

## 2024-02-14 NOTE — Progress Notes (Signed)
 Remote ICD transmission.

## 2024-02-26 ENCOUNTER — Encounter: Payer: Self-pay | Admitting: Cardiovascular Disease

## 2024-02-26 ENCOUNTER — Ambulatory Visit: Attending: Cardiovascular Disease | Admitting: Cardiovascular Disease

## 2024-02-26 VITALS — BP 112/76 | HR 67 | Ht 69.0 in | Wt 296.0 lb

## 2024-02-26 DIAGNOSIS — I428 Other cardiomyopathies: Secondary | ICD-10-CM

## 2024-02-26 DIAGNOSIS — E66813 Obesity, class 3: Secondary | ICD-10-CM

## 2024-02-26 DIAGNOSIS — E782 Mixed hyperlipidemia: Secondary | ICD-10-CM

## 2024-02-26 DIAGNOSIS — Z95810 Presence of automatic (implantable) cardiac defibrillator: Secondary | ICD-10-CM

## 2024-02-26 DIAGNOSIS — I10 Essential (primary) hypertension: Secondary | ICD-10-CM | POA: Diagnosis not present

## 2024-02-26 DIAGNOSIS — Z6841 Body Mass Index (BMI) 40.0 and over, adult: Secondary | ICD-10-CM

## 2024-02-26 DIAGNOSIS — I454 Nonspecific intraventricular block: Secondary | ICD-10-CM

## 2024-02-26 NOTE — Assessment & Plan Note (Signed)
 History of hyperlipidemia not on statin therapy with lipid profile performed 10 months ago revealing total cholesterol 189, LDL 116 HDL of 42.  She does have normal coronary arteries.

## 2024-02-26 NOTE — Assessment & Plan Note (Signed)
 Implanted by Dr. Carolynne Citron who he follows as an outpatient.

## 2024-02-26 NOTE — Assessment & Plan Note (Signed)
 History of nonischemic cardiomyopathy with a EF initially in the 30 to 35% range by echo 05/17/2017.  She did have a CRT-D implanted by Dr. Carolynne Citron successfully 06/26/2017 resulting in normalization of her EF.  Most recent echo performed 03/01/2023 was normal as well.  Her PCP did stop her metoprolol  because of relative hypotension.  She is asymptomatic.  She takes as needed hydrochlorothiazide  and her furosemide  because of edema but has not had to take this recently.  I am going to recheck a 2D echocardiogram.

## 2024-02-26 NOTE — Patient Instructions (Signed)
 Medication Instructions:  Your physician recommends that you continue on your current medications as directed. Please refer to the Current Medication list given to you today.  *If you need a refill on your cardiac medications before your next appointment, please call your pharmacy*  Testing/Procedures: Your physician has requested that you have an echocardiogram. Echocardiography is a painless test that uses sound waves to create images of your heart. It provides your doctor with information about the size and shape of your heart and how well your heart's chambers and valves are working. This procedure takes approximately one hour. There are no restrictions for this procedure. Please do NOT wear cologne, perfume, aftershave, or lotions (deodorant is allowed). Please arrive 15 minutes prior to your appointment time.  Please note: We ask at that you not bring children with you during ultrasound (echo/ vascular) testing. Due to room size and safety concerns, children are not allowed in the ultrasound rooms during exams. Our front office staff cannot provide observation of children in our lobby area while testing is being conducted. An adult accompanying a patient to their appointment will only be allowed in the ultrasound room at the discretion of the ultrasound technician under special circumstances. We apologize for any inconvenience.   Follow-Up: At [facility], you and your health needs are our priority.  As part of our continuing mission to provide you with exceptional heart care, our providers are all part of one team.  This team includes your primary Cardiologist (physician) and Advanced Practice Providers or APPs (Physician Assistants and Nurse Practitioners) who all work together to provide you with the care you need, when you need it.  Your next appointment:   12 month(s)  Provider:   Callie Goodrich, PA-C         Then, Lauro Portal, MD will plan to see you again in 2  year(s).    We recommend signing up for the patient portal called "MyChart".  Sign up information is provided on this After Visit Summary.  MyChart is used to connect with patients for Virtual Visits (Telemedicine).  Patients are able to view lab/test results, encounter notes, upcoming appointments, etc.  Non-urgent messages can be sent to your provider as well.   To learn more about what you can do with MyChart, go to [email or web address].

## 2024-02-26 NOTE — Assessment & Plan Note (Signed)
 Chronic.

## 2024-02-26 NOTE — Progress Notes (Signed)
 02/26/2024 Monica Solis   May 04, 1961  [id number]  Primary Physician [place], Sarah T, MD Primary Cardiologist: Avanell Leigh MD Bennye Bravo, [place]  HPI:  Monica Solis is a 63 y.o.  severely overweight married Caucasian female with no children who was referred by her general surgeon, Dr. Dorrie Gaudier,  for preoperative coronary vessel clearance prior to elective Roux-en-Y bariatric surgery. I last saw her in the office 12/15/2021.  Her risk factors for heart disease include treated diabetes, and hypertension. She does have a strong family history of heart disease with a father who died at age 67 of a myocardial infarction and brother at age 28. She has never had a heart attack or stroke. She denies chest pain or shortness of breath. She does have factor V Leiden deficiency has had DVT and pulmonary emboli in the past (2011). I performed a pharmacologic Myoview  stress test that showed no evidence of ischemia but gated SPECT showed an ejection fraction of 35%. This was confirmed by 2-D echocardiography. She does now admit to episodes of chest burning which she has thought was reflux in the past. She underwent outpatien are without COVID-19 okay on left I believe 6 months ago I think we arrange at that way your bariatric surgery and on I know we we did a t coronary angiography. The right radial approach by myself 01/03/17 revealing normal coronary arteries with an EF in the 30-35% range. She does have symptoms of congestive heart failure as well, class II to class III, and left bundle-branch block. Since I saw her back in 3 months ago we have optimized her heart failure pharmacotherapy for her EF by 2-D echo performed 05/17/17 remained depressed at 30-35%   I referred her to Dr. Jalene Mayor for consideration of IV ICD implantation for CRT which he did successfully on 06/26/17.  Follow-up 2D echo performed 12/16/2017 revealed normal ejection fraction.   Since I spoke to her 2 years ago she has done well.   She has seen Sharren Decree, PA-C in the interim.  Her Entresto  was discontinued because of side effects and losartan  started.  Her PCP stopped her metoprolol  because of relative hypotension.  Her last 2D echo performed 03/01/2023 revealed normal LV systolic function.  She denies chest pain or shortness of breath.   Current Meds  Medication Sig   B-D ULTRAFINE III SHORT PEN 31G X 8 MM MISC Inject into the skin 4 (four) times daily.   Cholecalciferol  (VITAMIN D3) 2000 UNITS capsule Take 4,000 Units by mouth daily with supper.   clobetasol  ointment (TEMOVATE ) 0.05 % Apply 1 Application topically 2 (two) times daily. Use twice a day x 2 weeks, then twice weekly   esomeprazole (NEXIUM) 40 MG capsule Take 40 mg by mouth daily.   fexofenadine (ALLEGRA) 180 MG tablet Take 180 mg by mouth daily as needed for allergies or rhinitis.   furosemide  (LASIX ) 20 MG tablet Take 20 mg by mouth as needed. Take 1 tablet by mouth for swelling and/or weight gain of 3 lbs overnight and 5 lbs in a week.   gabapentin  (NEURONTIN ) 100 MG capsule Take 100 mg in the morning and 300 mg at bedtime   hydrochlorothiazide  (HYDRODIURIL ) 12.5 MG tablet Take 1 tablet (12.5 mg total) by mouth daily. Take 1 Tablet Daily.   levothyroxine  (SYNTHROID ) 88 MCG tablet Take 88 mcg by mouth daily.   losartan  (COZAAR ) 25 MG tablet Take 1 tablet (25 mg total) by mouth daily.   magnesium oxide (  MAG-OX) 400 (240 Mg) MG tablet Take 400 mg by mouth daily.   Menthol, Topical Analgesic, (BENGAY EX) Apply 1 application topically 2 (two) times daily as needed (pain).   mometasone (NASONEX) 50 MCG/ACT nasal spray Place 2 sprays into the nose as needed.   MOUNJARO 5 MG/0.5ML Pen Inject 5 mg into the skin once a week.   Multiple Vitamin (MULTIVITAMIN) tablet Take 1 tablet by mouth daily. Geritol   ondansetron  (ZOFRAN -ODT) 4 MG disintegrating tablet Take 4 mg by mouth.   ONETOUCH VERIO test strip SMARTSIG:1 Each Via Meter 3 Times Daily   XARELTO  20 MG  TABS tablet Take 1 tablet (20 mg total) by mouth daily.     Allergies  Allergen Reactions   Cephalexin     Other Reaction(s): Other (See Comments)  Flu like symptoms   Crestor [Rosuvastatin Calcium ] Other (See Comments)    Muscle aches    Nexletol [Bempedoic Acid] Other (See Comments)    Muscle    Other Hives    Shingles vaccine    Metals causes swelling (staples or earrings)   Benadryl [Diphenhydramine Hcl] Hives   Canagliflozin Other (See Comments)    Loss of bladder control Invokana   Metformin Other (See Comments)    Kidney damage    Pravastatin Rash and Other (See Comments)    Muscle ache   Tramadol Rash and Other (See Comments)    "nightmares"    Victoza  [Liraglutide ] Other (See Comments)    "Fainting" "Fainting" Hypotension,     Social History   Socioeconomic History   Marital status: Married    Spouse name: Aimee Houseman   Number of children: 0   Years of education: Not on file   Highest education level: Not on file  Occupational History   Occupation: home maker  Tobacco Use   Smoking status: Former    Current packs/day: 0.00    Average packs/day: 0.1 packs/day for 2.0 years (0.2 ttl pk-yrs)    Types: Cigarettes    Start date: 10/01/1977    Quit date: 10/02/1979    Years since quitting: 44.4    Passive exposure: Past   Smokeless tobacco: Never  Vaping Use   Vaping status: Never Used  Substance and Sexual Activity   Alcohol use: No    Alcohol/week: 0.0 standard drinks of alcohol   Drug use: No   Sexual activity: Not Currently    Partners: Male    Birth control/protection: Surgical, Post-menopausal    Comment: BTL, First IC >16 y/o, <5 Partners  Other Topics Concern   Not on file  Social History Narrative   Not on file   Social Drivers of Health   Financial Resource Strain: Not at Risk (11/21/2023)   Received from [employer]    Financial Resource Strain: 1  Food Insecurity: Not at Risk (11/21/2023)   Received from [employer] Insecurity    Food: 1  Transportation Needs: Not at Risk (11/21/2023)   Received from [employer] Needs    Transportation: 1  Physical Activity: At Risk (11/21/2023)   Received from [facility]   Physical Activity    Physical Activity: 2  Stress: Not at Risk (11/21/2023)   Received from [facility]   Stress    Stress: 1  Social Connections: Not at Risk (11/21/2023)   Received from [facility]   Social Connections    Connectedness: 1  Intimate Partner Violence: Not on file     Review of Systems:  General: negative for chills, fever, night sweats or weight changes.  Cardiovascular: negative for chest pain, dyspnea on exertion, edema, orthopnea, palpitations, paroxysmal nocturnal dyspnea or shortness of breath Dermatological: negative for rash Respiratory: negative for cough or wheezing Urologic: negative for hematuria Abdominal: negative for nausea, vomiting, diarrhea, bright red blood per rectum, melena, or hematemesis Neurologic: negative for visual changes, syncope, or dizziness All other systems reviewed and are otherwise negative except as noted above.    Blood pressure 112/76, pulse 67, height 5\' 9"  (1.753 m), weight 296 lb (134.3 kg), last menstrual period 08/07/2016, SpO2 97%.  General appearance: alert and no distress Neck: no adenopathy, no carotid bruit, no JVD, supple, symmetrical, trachea midline, and thyroid  not enlarged, symmetric, no tenderness/mass/nodules Lungs: clear to auscultation bilaterally Heart: regular rate and rhythm, S1, S2 normal, no murmur, click, rub or gallop Extremities: extremities normal, atraumatic, no cyanosis or edema Pulses: 2+ and symmetric Skin: Skin color, texture, turgor normal. No rashes or lesions Neurologic: Grossly normal  EKG atrial sensed ventricularly paced rhythm at 66.  I personally reviewed this EKG.      ASSESSMENT AND PLAN:   BBB (bundle branch block) Chronic  Obesity BMI of 44.  Started on GLP-1 agonist by her  PCP.  Primary hypertension History of essential hypertension with blood pressure measured today 112/76.  She is on losartan .  Her metoprolol  was discontinued because of relative hypotension.  Hyperlipidemia History of hyperlipidemia not on statin therapy with lipid profile performed 10 months ago revealing total cholesterol 189, LDL 116 HDL of 42.  She does have normal coronary arteries.  Non-ischemic cardiomyopathy (HCC) History of nonischemic cardiomyopathy with a EF initially in the 30 to 35% range by echo 05/17/2017.  She did have a CRT-D implanted by Dr. Carolynne Citron successfully 06/26/2017 resulting in normalization of her EF.  Most recent echo performed 03/01/2023 was normal as well.  Her PCP did stop her metoprolol  because of relative hypotension.  She is asymptomatic.  She takes as needed hydrochlorothiazide  and her furosemide  because of edema but has not had to take this recently.  I am going to recheck a 2D echocardiogram.  ICD (implantable cardioverter-defibrillator) in place Implanted by Dr. Carolynne Citron who he follows as an outpatient.     Avanell Leigh MD FACP,FACC,FAHA, [facility] 02/26/2024 11:48 AM

## 2024-02-26 NOTE — Assessment & Plan Note (Signed)
 History of essential hypertension with blood pressure measured today 112/76.  She is on losartan .  Her metoprolol  was discontinued because of relative hypotension.

## 2024-02-26 NOTE — Assessment & Plan Note (Signed)
 BMI of 44.  Started on GLP-1 agonist by her PCP.

## 2024-03-16 ENCOUNTER — Ambulatory Visit: Attending: Internal Medicine

## 2024-03-16 DIAGNOSIS — Z95810 Presence of automatic (implantable) cardiac defibrillator: Secondary | ICD-10-CM | POA: Diagnosis not present

## 2024-03-16 DIAGNOSIS — I5042 Chronic combined systolic (congestive) and diastolic (congestive) heart failure: Secondary | ICD-10-CM

## 2024-03-19 NOTE — Progress Notes (Signed)
 EPIC Encounter for ICM Monitoring  Patient Name: Monica Solis is a 63 y.o. female Date: 03/19/2024 Primary Care Physican: Devries, Abigail, MD Primary Cardiologist: Katheryne Pane Electrophysiologist: Carolynne Citron  01/03/2023 Office Weight: 340 lbs 05/28/2023 Weight: 320 lbs (started Mounjaro in April) 07/03/2023 Weight: 312 lbs 10/25/2023 Weight: 297 lbs 11/29/2023 Weight: 295 lbs 01/08/2024 Weight: 293-295 lbs 02/11/2024 Weight: 295-296 lbs 03/19/2024 Weight: 292-295 lbs   Spoke with patient and heart failure questions reviewed.  Transmission results reviewed.  Pt asymptomatic for fluid accumulation.  Reports feeling well at this time and voices no complaints.  She continues to taking Mounjaro.     HeartLogic HF Index is 2 suggesting fluid levels are within normal threshold range.     Prescribed:  Hydrochlorothiazide  12.5 mg Take 12.5 mg by mouth daily.     Furosemide  20 mg take 1 tablet (20 mg total) by mouth as needed.  Take 1 tablet for swelling and/or weight gain of 3 lbs overnight and 5 lbs in a week.    Labs: 07/10/2023 Creatinine 0.95, BUN 17, Potassium 3.9, Sodium 140, GFR 64.71 04/25/2023 Creatinine 0.84, BUN 16, Potassium 3.7, Sodium 142, GFR 79 01/03/2023 Creatinine 0.97, BUN 16, Potassium 4.9, Sodium 145, GFR 67 A complete set of results can be found in Results Review.   Recommendations:   No changes and encouraged to call if experiencing any fluid symptoms.   Follow-up plan: ICM clinic phone appointment on 05/04/2024.   91 day device clinic remote transmission 04/01/2024.              EP/Cardiology next office visit:   Recall 02/20/2025 with Sharren Decree, PA.    Recall 08/02/2024 with Dr Carolynne Citron.            Copy of ICM check sent to Dr. Carolynne Citron.  3 Month HeartLogicT Heart Failure Index:    8 Day Data Trend:          Almyra Jain, RN 03/19/2024 7:09 AM

## 2024-03-31 ENCOUNTER — Encounter (HOSPITAL_BASED_OUTPATIENT_CLINIC_OR_DEPARTMENT_OTHER): Payer: Self-pay | Admitting: Family Medicine

## 2024-03-31 ENCOUNTER — Ambulatory Visit (HOSPITAL_BASED_OUTPATIENT_CLINIC_OR_DEPARTMENT_OTHER): Admitting: Family Medicine

## 2024-03-31 VITALS — BP 109/81 | HR 92 | Temp 97.9°F | Resp 16 | Ht 68.11 in | Wt 294.2 lb

## 2024-03-31 DIAGNOSIS — E039 Hypothyroidism, unspecified: Secondary | ICD-10-CM

## 2024-03-31 DIAGNOSIS — M8580 Other specified disorders of bone density and structure, unspecified site: Secondary | ICD-10-CM

## 2024-03-31 DIAGNOSIS — E1165 Type 2 diabetes mellitus with hyperglycemia: Secondary | ICD-10-CM | POA: Diagnosis not present

## 2024-03-31 DIAGNOSIS — G4762 Sleep related leg cramps: Secondary | ICD-10-CM | POA: Insufficient documentation

## 2024-03-31 MED ORDER — ACCU-CHEK AVIVA PLUS W/DEVICE KIT
1.0000 | PACK | Freq: Three times a day (TID) | 5 refills | Status: AC
Start: 2024-03-31 — End: 2024-04-01

## 2024-03-31 NOTE — Assessment & Plan Note (Addendum)
 Promptly request/review old records.  She declines a CGM, and doesn't mind sticking her fingers.  Continue weight loss efforts.  Await labs and f/u with her soon.  Try to help more with her suspected RLS after we have more info.

## 2024-03-31 NOTE — Progress Notes (Signed)
 Established Patient Office Visit  Subjective   Patient ID: Monica Solis, female    DOB: 1960/12/18  Age: 63 y.o. MRN: [id number]  Chief Complaint  Patient presents with   Establish Care    Pt. Here to establish care.     F/u as above.  New to my practice.  Generally doing well despite numerous medical issues.  Has lost considerable weight on her medication.  Naturally was commended.  Sees eye doctor regularly.  Last Colonoscopy in 2023, and f/by GI.  Increasing frustrated with nocturnal leg pain.  Does get some relief when she bears weight and walks in the evenings.  Had some labs with her previous PCP, and I'll defer lipids, Ferritin today.    Past Medical History:  Diagnosis Date   AICD (automatic cardioverter/defibrillator) present    BMI 45.0-49.9, adult (HCC)    CHF (congestive heart failure) (HCC)    Constipation    Diverticulosis of colon    Factor 5 Leiden mutation, heterozygous (HCC)    dx 2011   GERD (gastroesophageal reflux disease)    History of DVT of lower extremity 2000; 2011   I've had them in both   History of ectopic pregnancy 1998   History of pulmonary embolus (PE)    Hyperlipidemia    Hypertension    Hypothyroidism    Left bundle branch block (LBBB)    Morbid obesity (HCC)    Non-ischemic cardiomyopathy (HCC)    f/by Dr. Katheryne Pane of [facility]   OSA (obstructive sleep apnea)    Much improved after weight loss   Osteoarthritis    Ovarian failure    f/by GYN annually   Peripheral neuropathy    Vitamin D  deficiency     Outpatient Encounter Medications as of 03/31/2024  Medication Sig   Cholecalciferol  (VITAMIN D3) 2000 UNITS capsule Take 4,000 Units by mouth daily with supper.   esomeprazole (NEXIUM) 40 MG capsule Take 40 mg by mouth daily.   fexofenadine (ALLEGRA) 180 MG tablet Take 180 mg by mouth daily as needed for allergies or rhinitis.   furosemide  (LASIX ) 20 MG tablet Take 20 mg by mouth as needed. Take 1 tablet by mouth for swelling and/or  weight gain of 3 lbs overnight and 5 lbs in a week.   gabapentin  (NEURONTIN ) 100 MG capsule Take 100 mg in the morning and 300 mg at bedtime   hydrochlorothiazide  (HYDRODIURIL ) 12.5 MG tablet Take 1 tablet (12.5 mg total) by mouth daily. Take 1 Tablet Daily.   levothyroxine  (SYNTHROID ) 88 MCG tablet Take 88 mcg by mouth daily.   levothyroxine  (SYNTHROID ) 88 MCG tablet Take 1 tablet by mouth daily.   losartan  (COZAAR ) 25 MG tablet Take 1 tablet (25 mg total) by mouth daily.   magnesium oxide (MAG-OX) 400 (240 Mg) MG tablet Take 400 mg by mouth daily.   Menthol, Topical Analgesic, (BENGAY EX) Apply 1 application topically 2 (two) times daily as needed (pain).   mometasone (NASONEX) 50 MCG/ACT nasal spray Place 2 sprays into the nose as needed.   MOUNJARO 5 MG/0.5ML Pen Inject 5 mg into the skin once a week.   Multiple Vitamin (MULTIVITAMIN) tablet Take 1 tablet by mouth daily. Geritol   ondansetron  (ZOFRAN -ODT) 4 MG disintegrating tablet Take 4 mg by mouth.   ONETOUCH VERIO test strip SMARTSIG:1 Each Via Meter 3 Times Daily   XARELTO  20 MG TABS tablet Take 1 tablet (20 mg total) by mouth daily.   [DISCONTINUED] Blood Glucose Monitoring Suppl (ACCU-CHEK AVIVA PLUS)  w/Device KIT by Does not apply route.   B-D ULTRAFINE III SHORT PEN 31G X 8 MM MISC Inject into the skin 4 (four) times daily. (Patient not taking: Reported on 03/31/2024)   Blood Glucose Monitoring Suppl (ACCU-CHEK AVIVA PLUS) w/Device KIT 1 Device by Does not apply route 3 x daily with food for 3 doses.   clobetasol  ointment (TEMOVATE ) 0.05 % Apply 1 Application topically 2 (two) times daily. Use twice a day x 2 weeks, then twice weekly (Patient not taking: Reported on 03/31/2024)   Facility-Administered Encounter Medications as of 03/31/2024  Medication   technetium tetrofosmin  (TC-MYOVIEW ) injection 32.4 millicurie    Social History   Tobacco Use   Smoking status: Former    Current packs/day: 0.00    Average packs/day: 0.1  packs/day for 2.0 years (0.2 ttl pk-yrs)    Types: Cigarettes    Start date: 10/01/1977    Quit date: 10/02/1979    Years since quitting: 44.5    Passive exposure: Past   Smokeless tobacco: Never  Vaping Use   Vaping status: Never Used  Substance Use Topics   Alcohol use: No    Alcohol/week: 0.0 standard drinks of alcohol   Drug use: No      Review of Systems  Constitutional:  Negative for diaphoresis, fever, malaise/fatigue and weight loss.  Respiratory:  Negative for cough, shortness of breath and wheezing.   Cardiovascular:  Negative for chest pain, palpitations, orthopnea, claudication, leg swelling and PND.      Objective:     BP 109/81 (BP Location: Right Arm, Patient Position: Standing, Cuff Size: Large)   Pulse 92   Temp 97.9 F (36.6 C) (Oral)   Resp 16   Ht 5' 8.11 (1.73 m)   Wt 294 lb 3.2 oz (133.4 kg)   LMP 08/07/2016   SpO2 96%   BMI 44.59 kg/m    Physical Exam Constitutional:      General: She is not in acute distress.    Appearance: Normal appearance. She is obese.  HENT:     Head: Normocephalic.   Cardiovascular:     Rate and Rhythm: Normal rate and regular rhythm.     Pulses: Normal pulses.     Heart sounds: Normal heart sounds.  Pulmonary:     Effort: Pulmonary effort is normal.     Breath sounds: Normal breath sounds.  Abdominal:     General: Bowel sounds are normal.     Palpations: Abdomen is soft.   Musculoskeletal:     Cervical back: Neck supple. No tenderness.     Right lower leg: Edema present.     Left lower leg: Edema present.     Comments: 1-2+ bilateral LE edema   Neurological:     Mental Status: She is alert.      No results found for any visits on 03/31/24.    The 10-year ASCVD risk score (Arnett DK, et al., 2019) is: 8.4%    Assessment & Plan:  Type 2 diabetes mellitus with hyperglycemia, without long-term current use of insulin  [facility]) Assessment & Plan: Promptly request/review old records.  She declines a  CGM, and doesn't mind sticking her fingers.  Continue weight loss efforts.  Await labs and f/u with her soon.  Try to help more with her suspected RLS after we have more info.  Orders: -     Hemoglobin A1c -     Comprehensive metabolic panel with GFR -     Accu-Chek Aviva Plus; 1  Device by Does not apply route 3 x daily with food for 3 doses.  Dispense: 1 kit; Refill: 5  Morbid obesity (HCC)  Osteopenia, unspecified location -     HM DEXA SCAN  Hypothyroidism, unspecified type -     TSH  Nocturnal leg cramps    Return in about 2 weeks (around 04/14/2024) for chronic follow-up.    REDDING Reece Cane., MD

## 2024-04-01 ENCOUNTER — Ambulatory Visit (INDEPENDENT_AMBULATORY_CARE_PROVIDER_SITE_OTHER): Payer: 59

## 2024-04-01 ENCOUNTER — Ambulatory Visit (HOSPITAL_BASED_OUTPATIENT_CLINIC_OR_DEPARTMENT_OTHER): Payer: Self-pay | Admitting: Family Medicine

## 2024-04-01 DIAGNOSIS — I428 Other cardiomyopathies: Secondary | ICD-10-CM | POA: Diagnosis not present

## 2024-04-01 LAB — COMPREHENSIVE METABOLIC PANEL WITH GFR
ALT: 24 IU/L (ref 0–32)
AST: 20 IU/L (ref 0–40)
Albumin: 4.7 g/dL (ref 3.9–4.9)
Alkaline Phosphatase: 81 IU/L (ref 44–121)
BUN/Creatinine Ratio: 21 (ref 12–28)
BUN: 18 mg/dL (ref 8–27)
Bilirubin Total: 0.8 mg/dL (ref 0.0–1.2)
CO2: 22 mmol/L (ref 20–29)
Calcium: 9.8 mg/dL (ref 8.7–10.3)
Chloride: 104 mmol/L (ref 96–106)
Creatinine, Ser: 0.87 mg/dL (ref 0.57–1.00)
Globulin, Total: 2.8 g/dL (ref 1.5–4.5)
Glucose: 108 mg/dL — ABNORMAL HIGH (ref 70–99)
Potassium: 4.6 mmol/L (ref 3.5–5.2)
Sodium: 143 mmol/L (ref 134–144)
Total Protein: 7.5 g/dL (ref 6.0–8.5)
eGFR: 75 mL/min/{1.73_m2} (ref >59)

## 2024-04-01 LAB — FERRITIN: Ferritin: 159 ng/mL — ABNORMAL HIGH (ref 15–150)

## 2024-04-01 LAB — HEMOGLOBIN A1C
Est. average glucose Bld gHb Est-mCnc: 117 mg/dL
Hgb A1c MFr Bld: 5.7 % — ABNORMAL HIGH (ref 4.8–5.6)

## 2024-04-01 LAB — TSH: TSH: 2.05 u[IU]/mL (ref 0.450–4.500)

## 2024-04-02 ENCOUNTER — Ambulatory Visit: Payer: Self-pay | Admitting: Internal Medicine

## 2024-04-02 ENCOUNTER — Other Ambulatory Visit (HOSPITAL_BASED_OUTPATIENT_CLINIC_OR_DEPARTMENT_OTHER): Payer: Self-pay

## 2024-04-02 LAB — CUP PACEART REMOTE DEVICE CHECK
Battery Remaining Longevity: 72 mo
Battery Remaining Percentage: 80 %
Brady Statistic RA Percent Paced: 0 %
Brady Statistic RV Percent Paced: 0 %
Date Time Interrogation Session: 20250618005200
HighPow Impedance: 67 Ohm
Implantable Lead Connection Status: 753985
Implantable Lead Connection Status: 753985
Implantable Lead Connection Status: 753985
Implantable Lead Implant Date: 20180912
Implantable Lead Implant Date: 20180912
Implantable Lead Implant Date: 20180912
Implantable Lead Location: 753858
Implantable Lead Location: 753859
Implantable Lead Location: 753860
Implantable Lead Model: 293
Implantable Lead Model: 4674
Implantable Lead Model: 7741
Implantable Lead Serial Number: 435131
Implantable Lead Serial Number: 813037
Implantable Lead Serial Number: 920623
Implantable Pulse Generator Implant Date: 20180912
Lead Channel Impedance Value: 569 Ohm
Lead Channel Impedance Value: 605 Ohm
Lead Channel Impedance Value: 758 Ohm
Lead Channel Setting Pacing Amplitude: 2 V
Lead Channel Setting Pacing Amplitude: 2.5 V
Lead Channel Setting Pacing Amplitude: 2.5 V
Lead Channel Setting Pacing Pulse Width: 0.4 ms
Lead Channel Setting Pacing Pulse Width: 0.4 ms
Lead Channel Setting Sensing Sensitivity: 0.5 mV
Lead Channel Setting Sensing Sensitivity: 1 mV
Pulse Gen Serial Number: 193993
Zone Setting Status: 755011

## 2024-04-06 ENCOUNTER — Ambulatory Visit (HOSPITAL_COMMUNITY)
Admission: RE | Admit: 2024-04-06 | Discharge: 2024-04-06 | Disposition: A | Discharge status: Home / Self Care | Source: Ambulatory Visit | Attending: Cardiology | Admitting: Cardiology

## 2024-04-06 ENCOUNTER — Ambulatory Visit: Payer: Self-pay | Admitting: Cardiovascular Disease

## 2024-04-06 DIAGNOSIS — I428 Other cardiomyopathies: Secondary | ICD-10-CM

## 2024-04-06 DIAGNOSIS — Z95810 Presence of automatic (implantable) cardiac defibrillator: Secondary | ICD-10-CM

## 2024-04-06 DIAGNOSIS — E66813 Obesity, class 3: Secondary | ICD-10-CM

## 2024-04-06 DIAGNOSIS — Z6841 Body Mass Index (BMI) 40.0 and over, adult: Secondary | ICD-10-CM

## 2024-04-06 DIAGNOSIS — E782 Mixed hyperlipidemia: Secondary | ICD-10-CM | POA: Diagnosis not present

## 2024-04-06 DIAGNOSIS — I10 Essential (primary) hypertension: Secondary | ICD-10-CM

## 2024-04-06 DIAGNOSIS — I454 Nonspecific intraventricular block: Secondary | ICD-10-CM | POA: Diagnosis not present

## 2024-04-06 LAB — ECHOCARDIOGRAM COMPLETE
Area-P 1/2: 5.38 cm2
S' Lateral: 3.9 cm

## 2024-04-16 ENCOUNTER — Encounter (HOSPITAL_BASED_OUTPATIENT_CLINIC_OR_DEPARTMENT_OTHER): Payer: Self-pay | Admitting: Family Medicine

## 2024-04-16 ENCOUNTER — Ambulatory Visit (HOSPITAL_BASED_OUTPATIENT_CLINIC_OR_DEPARTMENT_OTHER): Admitting: Family Medicine

## 2024-04-16 VITALS — BP 96/72 | HR 99 | Temp 97.6°F | Resp 16 | Ht 68.11 in | Wt 293.7 lb

## 2024-04-16 DIAGNOSIS — E782 Mixed hyperlipidemia: Secondary | ICD-10-CM

## 2024-04-16 DIAGNOSIS — E1129 Type 2 diabetes mellitus with other diabetic kidney complication: Secondary | ICD-10-CM

## 2024-04-16 DIAGNOSIS — R809 Proteinuria, unspecified: Secondary | ICD-10-CM | POA: Diagnosis not present

## 2024-04-16 DIAGNOSIS — I5042 Chronic combined systolic (congestive) and diastolic (congestive) heart failure: Secondary | ICD-10-CM

## 2024-04-16 NOTE — Assessment & Plan Note (Addendum)
 Stable.  She notes a Mammogram from only a few months ago.  Recent Colonoscopy noted.  Continue weight loss efforts and specialist follow up as directed.

## 2024-04-16 NOTE — Progress Notes (Signed)
 Established Patient Office Visit  Subjective   Patient ID: Monica Solis, female    DOB: May 06, 1961  Age: 63 y.o. MRN: [id number]  Chief Complaint  Patient presents with   Follow-up    Follow-up visit    F/u as above.  Please see previous notes for details.  Ongoing struggles with her weight loss.  She reminds me that she is quite statin intolerant.  Her lipids have already been discussed in detail with Dr. Court and others.  She remains undecided about seeing the Lipid clinic to consider Repatha, etc.  Recent labs again reviewed.    Past Medical History:  Diagnosis Date   AICD (automatic cardioverter/defibrillator) present    BMI 45.0-49.9, adult (HCC)    CHF (congestive heart failure) (HCC)    Diverticulosis of colon    Factor 5 Leiden mutation, heterozygous (HCC)    dx 2011   GERD (gastroesophageal reflux disease)    History of DVT of lower extremity 2000; 2011   I've had them in both   History of ectopic pregnancy 1998   History of pulmonary embolus (PE)    Hyperlipidemia    STATIN INTOLERANT.  Lipid clinic advised.   Hypertension    Hypothyroidism    Left bundle branch block (LBBB)    Morbid obesity (HCC)    Non-ischemic cardiomyopathy (HCC)    f/by Dr. Court of Cards   OSA (obstructive sleep apnea)    Largely resolved.  Much improved after weight loss   Osteoarthritis    Peripheral neuropathy    Vitamin D  deficiency     Outpatient Encounter Medications as of 04/16/2024  Medication Sig   gabapentin  (NEURONTIN ) 100 MG capsule Take 100 mg by mouth at bedtime. 100mg  Take 2 tablets @ bedtime   hydrochlorothiazide  (HYDRODIURIL ) 12.5 MG tablet Take 1 tablet (12.5 mg total) by mouth daily. Take 1 Tablet Daily.   levothyroxine  (SYNTHROID ) 88 MCG tablet Take 88 mcg by mouth daily.   losartan  (COZAAR ) 25 MG tablet Take 1 tablet (25 mg total) by mouth daily.   magnesium oxide (MAG-OX) 400 (240 Mg) MG tablet Take 400 mg by mouth daily.   mometasone (NASONEX) 50 MCG/ACT  nasal spray Place 2 sprays into the nose as needed.   Multiple Vitamin (MULTIVITAMIN) tablet Take 1 tablet by mouth daily. Geritol   ONETOUCH VERIO test strip SMARTSIG:1 Each Via Meter 3 Times Daily   tirzepatide (MOUNJARO) 7.5 MG/0.5ML Pen Inject 5 mg into the skin once a week.   XARELTO  20 MG TABS tablet Take 1 tablet (20 mg total) by mouth daily.   [DISCONTINUED] levothyroxine  (SYNTHROID ) 88 MCG tablet Take 1 tablet by mouth daily.   [DISCONTINUED] Menthol, Topical Analgesic, (BENGAY EX) Apply 1 application topically 2 (two) times daily as needed (pain).   [DISCONTINUED] MOUNJARO 5 MG/0.5ML Pen Inject 7.5 mLs into the skin once a week.   [DISCONTINUED] ondansetron  (ZOFRAN -ODT) 4 MG disintegrating tablet Take 4 mg by mouth.   B-D ULTRAFINE III SHORT PEN 31G X 8 MM MISC Inject into the skin 4 (four) times daily. (Patient not taking: Reported on 03/31/2024)   Cholecalciferol  (VITAMIN D3) 2000 UNITS capsule Take 4,000 Units by mouth daily with supper.   clobetasol  ointment (TEMOVATE ) 0.05 % Apply 1 Application topically 2 (two) times daily. Use twice a day x 2 weeks, then twice weekly (Patient not taking: Reported on 03/31/2024)   esomeprazole (NEXIUM) 40 MG capsule Take 40 mg by mouth daily.   fexofenadine (ALLEGRA) 180 MG tablet Take 180  mg by mouth daily as needed for allergies or rhinitis.   furosemide  (LASIX ) 20 MG tablet Take 20 mg by mouth as needed. Take 1 tablet by mouth for swelling and/or weight gain of 3 lbs overnight and 5 lbs in a week.   Facility-Administered Encounter Medications as of 04/16/2024  Medication   technetium tetrofosmin  (TC-MYOVIEW ) injection 32.4 millicurie    Social History   Tobacco Use   Smoking status: Former    Current packs/day: 0.00    Average packs/day: 0.1 packs/day for 2.0 years (0.2 ttl pk-yrs)    Types: Cigarettes    Start date: 10/01/1977    Quit date: 10/02/1979    Years since quitting: 44.5    Passive exposure: Past   Smokeless tobacco: Never   Vaping Use   Vaping status: Never Used  Substance Use Topics   Alcohol use: No    Alcohol/week: 0.0 standard drinks of alcohol   Drug use: No      Review of Systems  Constitutional:  Negative for diaphoresis, fever, malaise/fatigue and weight loss.  Respiratory:  Negative for cough, shortness of breath and wheezing.   Cardiovascular:  Negative for chest pain, palpitations, orthopnea, claudication, leg swelling and PND.      Objective:     BP 96/72 (BP Location: Right Arm, Patient Position: Standing, Cuff Size: Large)   Pulse 99   Temp 97.6 F (36.4 C) (Oral)   Resp 16   Ht 5' 8.11 (1.73 m)   Wt 293 lb 11.2 oz (133.2 kg)   LMP 08/07/2016   SpO2 98%   BMI 44.51 kg/m    Physical Exam Constitutional:      General: She is not in acute distress.    Appearance: Normal appearance. She is obese.  HENT:     Head: Normocephalic.  Neck:     Vascular: No carotid bruit.     Comments: Thyroid  normal to palpation. Cardiovascular:     Rate and Rhythm: Normal rate and regular rhythm.     Pulses: Normal pulses.     Heart sounds: Normal heart sounds.  Pulmonary:     Effort: Pulmonary effort is normal.     Breath sounds: Normal breath sounds.  Abdominal:     General: Bowel sounds are normal.     Palpations: Abdomen is soft.  Musculoskeletal:     Cervical back: Neck supple. No tenderness.     Right lower leg: Edema present.     Left lower leg: Edema present.     Comments: 2+ bilateral LE edema  Neurological:     Mental Status: She is alert.      No results found for any visits on 04/16/24.    The 10-year ASCVD risk score (Arnett DK, et al., 2019) is: 6.6%    Assessment & Plan:  Chronic combined systolic and diastolic congestive heart failure (HCC) Assessment & Plan: Stable.  She notes a Mammogram from only a few months ago.  Recent Colonoscopy noted.  Continue weight loss efforts and specialist follow up as directed.   Mixed hyperlipidemia  Diabetes mellitus  with microalbuminuria (HCC) -     Microalbumin / creatinine urine ratio    Return in about 6 months (around 10/17/2024) for chronic follow-up.    REDDING PONCE NORLEEN FALCON., MD

## 2024-04-18 LAB — MICROALBUMIN / CREATININE URINE RATIO
Creatinine, Urine: 167.9 mg/dL
Microalb/Creat Ratio: 3 mg/g{creat} (ref 0–29)
Microalbumin, Urine: 4.3 ug/mL

## 2024-04-22 ENCOUNTER — Telehealth (HOSPITAL_BASED_OUTPATIENT_CLINIC_OR_DEPARTMENT_OTHER): Payer: Self-pay

## 2024-04-22 NOTE — Telephone Encounter (Signed)
 Pt said she needed the accu-check Aviva strips sent in. Said she couldn't get them because an rx was not sent with her meter. Pharmacy put meter back to stock. Asking for a new rx for meter as well. She is not out of strips but she was upset. Said she is not sure if it was the right choice to switch over to us . Also, she said she could not get Bone Density scheduled. I will message Clotilda about this. FWD to Dr. Dottie for approval.

## 2024-04-23 ENCOUNTER — Other Ambulatory Visit (HOSPITAL_BASED_OUTPATIENT_CLINIC_OR_DEPARTMENT_OTHER): Payer: Self-pay | Admitting: Family Medicine

## 2024-04-23 ENCOUNTER — Other Ambulatory Visit (HOSPITAL_BASED_OUTPATIENT_CLINIC_OR_DEPARTMENT_OTHER): Payer: Self-pay

## 2024-04-23 DIAGNOSIS — E1165 Type 2 diabetes mellitus with hyperglycemia: Secondary | ICD-10-CM

## 2024-04-23 DIAGNOSIS — M8580 Other specified disorders of bone density and structure, unspecified site: Secondary | ICD-10-CM

## 2024-04-23 MED ORDER — ACCU-CHEK AVIVA PLUS W/DEVICE KIT: 1.0000 | PACK | Freq: Every day | 0 refills | Status: DC

## 2024-04-23 MED ORDER — ACCU-CHEK AVIVA PLUS VI STRP: 1.0000 | ORAL_STRIP | Freq: Three times a day (TID) | 11 refills | Status: DC

## 2024-04-24 ENCOUNTER — Other Ambulatory Visit (HOSPITAL_BASED_OUTPATIENT_CLINIC_OR_DEPARTMENT_OTHER): Payer: Self-pay

## 2024-04-24 DIAGNOSIS — E1165 Type 2 diabetes mellitus with hyperglycemia: Secondary | ICD-10-CM

## 2024-04-24 MED ORDER — ACCU-CHEK GUIDE W/DEVICE KIT: 1.0000 | PACK | Freq: Every day | 0 refills | Status: AC

## 2024-04-24 MED ORDER — ACCU-CHEK GUIDE TEST VI STRP: 1.0000 | ORAL_STRIP | Freq: Three times a day (TID) | 12 refills | Status: AC

## 2024-04-29 ENCOUNTER — Ambulatory Visit (INDEPENDENT_AMBULATORY_CARE_PROVIDER_SITE_OTHER)
Admission: RE | Admit: 2024-04-29 | Discharge: 2024-04-29 | Disposition: A | Discharge status: Home / Self Care | Source: Ambulatory Visit | Attending: Family Medicine | Admitting: Family Medicine

## 2024-04-29 DIAGNOSIS — E2839 Other primary ovarian failure: Secondary | ICD-10-CM

## 2024-04-29 DIAGNOSIS — M8580 Other specified disorders of bone density and structure, unspecified site: Secondary | ICD-10-CM | POA: Diagnosis not present

## 2024-04-30 ENCOUNTER — Ambulatory Visit (HOSPITAL_BASED_OUTPATIENT_CLINIC_OR_DEPARTMENT_OTHER): Payer: Self-pay | Admitting: Family Medicine

## 2024-05-04 ENCOUNTER — Ambulatory Visit: Attending: Internal Medicine

## 2024-05-04 DIAGNOSIS — I5042 Chronic combined systolic (congestive) and diastolic (congestive) heart failure: Secondary | ICD-10-CM

## 2024-05-04 DIAGNOSIS — Z95810 Presence of automatic (implantable) cardiac defibrillator: Secondary | ICD-10-CM

## 2024-05-06 NOTE — Progress Notes (Signed)
 EPIC Encounter for ICM Monitoring  Patient Name: Monica Solis is a 63 y.o. female Date: 05/06/2024 Primary Care Physican: Dottie Norleen PHEBE PONCE, MD Primary Cardiologist: Court Electrophysiologist: Waddell  01/03/2023 Office Weight: 340 lbs 05/28/2023 Weight: 320 lbs (started Mounjaro in April) 07/03/2023 Weight: 312 lbs 10/25/2023 Weight: 297 lbs 11/29/2023 Weight: 295 lbs 01/08/2024 Weight: 293-295 lbs 02/11/2024 Weight: 295-296 lbs 03/19/2024 Weight: 292-295 lbs 05/06/2024 Weight: 286 lbs   Spoke with patient and heart failure questions reviewed.  Transmission results reviewed.  Pt asymptomatic for fluid accumulation.  Reports feeling well at this time and voices no complaints.  Has not needed Lasix  in a long time.  She wanted to know if a piece of equipment would interfere with the device.  Advised to call [employer] to ask if the equipment will interfere with the device.     HeartLogic HF Index is 1 suggesting fluid levels are within normal threshold range.     Prescribed:  Hydrochlorothiazide  12.5 mg Take 12.5 mg by mouth daily.     Furosemide  20 mg take 1 tablet (20 mg total) by mouth as needed.  Take 1 tablet for swelling and/or weight gain of 3 lbs overnight and 5 lbs in a week.    Labs: 07/10/2023 Creatinine 0.95, BUN 17, Potassium 3.9, Sodium 140, GFR 64.71 04/25/2023 Creatinine 0.84, BUN 16, Potassium 3.7, Sodium 142, GFR 79 01/03/2023 Creatinine 0.97, BUN 16, Potassium 4.9, Sodium 145, GFR 67 A complete set of results can be found in Results Review.   Recommendations:   No changes and encouraged to call if experiencing any fluid symptoms.   Follow-up plan: ICM clinic phone appointment on 06/08/2024.   91 day device clinic remote transmission 07/01/2024.              EP/Cardiology next office visit:   Recall 02/20/2025 with Aline Door, PA.    08/05/2024 with Dr Waddell.            Copy of ICM check sent to Dr. Waddell.  3 Month HeartLogicT Heart Failure Index:    8 Day  Data Trend:          Mitzie GORMAN Garner, RN 05/06/2024 8:43 AM

## 2024-05-27 ENCOUNTER — Telehealth (HOSPITAL_BASED_OUTPATIENT_CLINIC_OR_DEPARTMENT_OTHER): Payer: Self-pay | Admitting: *Deleted

## 2024-05-27 ENCOUNTER — Other Ambulatory Visit (HOSPITAL_BASED_OUTPATIENT_CLINIC_OR_DEPARTMENT_OTHER): Payer: Self-pay | Admitting: Family Medicine

## 2024-05-27 NOTE — Telephone Encounter (Signed)
 Copied from CRM #[id number]. Topic: Clinical - Medication Question >> May 27, 2024  1:37 PM Donna BRAVO wrote: Reason for CRM: patient calling stating she is taking vitamin D  1000 units a day. She is wondering if this is okay to take this.  Patient would like a call back regarding taking vitamin D   [phone]   Patient was made aware their call will be returned in 1 business day.

## 2024-05-27 NOTE — Telephone Encounter (Signed)
 Copied from CRM #[id number]. Topic: Clinical - Medication Refill >> May 27, 2024  1:34 PM Donna E wrote: Medication: tirzepatide  (MOUNJARO ) 7.5 MG/0.5ML Pen  patient would like a 90 day supply  Has the patient contacted their pharmacy? Yes  pharmacy stated to call provider   This is the patient's preferred pharmacy:   [id number] - [place], [place] - [address] AT [facility] [address] [place] [address] Phone: [phone] Fax: [phone]  Is this the correct pharmacy for this prescription? Yes If no, delete pharmacy and type the correct one.   Has the prescription been filled recently? No  Is the patient out of the medication? Yes  Has the patient been seen for an appointment in the last year OR does the patient have an upcoming appointment? Yes  Can we respond through MyChart? No  Agent: Please be advised that Rx refills may take up to 3 business days. We ask that you follow-up with your pharmacy.

## 2024-05-28 MED ORDER — TIRZEPATIDE 7.5 MG/0.5ML ~~LOC~~ SOAJ: 5.0000 mg | SUBCUTANEOUS | 5 refills | Status: DC

## 2024-06-08 ENCOUNTER — Ambulatory Visit: Attending: Internal Medicine

## 2024-06-08 DIAGNOSIS — I5042 Chronic combined systolic (congestive) and diastolic (congestive) heart failure: Secondary | ICD-10-CM

## 2024-06-08 DIAGNOSIS — Z95810 Presence of automatic (implantable) cardiac defibrillator: Secondary | ICD-10-CM

## 2024-06-11 ENCOUNTER — Telehealth: Payer: Self-pay

## 2024-06-11 NOTE — Addendum Note (Signed)
 Addended by: VICCI SELLER A on: 06/11/2024 10:07 AM   Modules accepted: Orders

## 2024-06-11 NOTE — Progress Notes (Signed)
 EPIC Encounter for ICM Monitoring  Patient Name: Monica Solis is a 63 y.o. female Date: 06/11/2024 Primary Care Physican: Dottie Norleen PHEBE PONCE, MD Primary Cardiologist: Court Electrophysiologist: Waddell  01/03/2023 Office Weight: 340 lbs 05/28/2023 Weight: 320 lbs (started Mounjaro  in April) 07/03/2023 Weight: 312 lbs 10/25/2023 Weight: 297 lbs 11/29/2023 Weight: 295 lbs 01/08/2024 Weight: 293-295 lbs 02/11/2024 Weight: 295-296 lbs 03/19/2024 Weight: 292-295 lbs 05/06/2024 Weight: 286 lbs   Attempted call to patient and unable to reach.  Left detailed message per DPR regarding transmission.  Transmission results reviewed.    HeartLogic HF Index is 0 suggesting fluid levels are within normal threshold range.     Prescribed:  Hydrochlorothiazide  12.5 mg Take 12.5 mg by mouth daily.     Furosemide  20 mg take 1 tablet (20 mg total) by mouth as needed.  Take 1 tablet for swelling and/or weight gain of 3 lbs overnight and 5 lbs in a week.    Labs: 07/10/2023 Creatinine 0.95, BUN 17, Potassium 3.9, Sodium 140, GFR 64.71 04/25/2023 Creatinine 0.84, BUN 16, Potassium 3.7, Sodium 142, GFR 79 01/03/2023 Creatinine 0.97, BUN 16, Potassium 4.9, Sodium 145, GFR 67 A complete set of results can be found in Results Review.   Recommendations:   Left voice mail with ICM number and encouraged to call if experiencing any fluid symptoms.   Follow-up plan: ICM clinic phone appointment on 07/13/2024.   91 day device clinic remote transmission 07/01/2024.              EP/Cardiology next office visit:   Recall 02/20/2025 with Aline Door, PA.    08/05/2024 with Dr Waddell.            Copy of ICM check sent to Dr. Waddell.  3 Month HeartLogicT Heart Failure Index:    8 Day Data Trend:          Mitzie GORMAN Garner, RN 06/11/2024 2:36 PM

## 2024-06-11 NOTE — Progress Notes (Signed)
 Remote ICD transmission.

## 2024-06-11 NOTE — Telephone Encounter (Signed)
 Remote ICM transmission received.  Attempted call to patient regarding ICM remote transmission and left detailed message per DPR.  Left ICM phone number and advised to return call for any fluid symptoms or questions. Next ICM remote transmission scheduled 07/13/2024.

## 2024-07-01 ENCOUNTER — Ambulatory Visit (INDEPENDENT_AMBULATORY_CARE_PROVIDER_SITE_OTHER): Payer: 59

## 2024-07-01 DIAGNOSIS — I428 Other cardiomyopathies: Secondary | ICD-10-CM | POA: Diagnosis not present

## 2024-07-06 LAB — CUP PACEART REMOTE DEVICE CHECK
Battery Remaining Longevity: 66 mo
Battery Remaining Percentage: 77 %
Brady Statistic RA Percent Paced: 0 %
Brady Statistic RV Percent Paced: 0 %
Date Time Interrogation Session: 20250917005100
HighPow Impedance: 68 Ohm
Implantable Lead Connection Status: 753985
Implantable Lead Connection Status: 753985
Implantable Lead Connection Status: 753985
Implantable Lead Implant Date: 20180912
Implantable Lead Implant Date: 20180912
Implantable Lead Implant Date: 20180912
Implantable Lead Location: 753858
Implantable Lead Location: 753859
Implantable Lead Location: 753860
Implantable Lead Model: 293
Implantable Lead Model: 4674
Implantable Lead Model: 7741
Implantable Lead Serial Number: 435131
Implantable Lead Serial Number: 813037
Implantable Lead Serial Number: 920623
Implantable Pulse Generator Implant Date: 20180912
Lead Channel Impedance Value: 601 Ohm
Lead Channel Impedance Value: 609 Ohm
Lead Channel Impedance Value: 741 Ohm
Lead Channel Setting Pacing Amplitude: 2 V
Lead Channel Setting Pacing Amplitude: 2.5 V
Lead Channel Setting Pacing Amplitude: 2.5 V
Lead Channel Setting Pacing Pulse Width: 0.4 ms
Lead Channel Setting Pacing Pulse Width: 0.4 ms
Lead Channel Setting Sensing Sensitivity: 0.5 mV
Lead Channel Setting Sensing Sensitivity: 1 mV
Pulse Gen Serial Number: 193993
Zone Setting Status: 755011

## 2024-07-07 NOTE — Progress Notes (Signed)
Remote ICD Transmission.

## 2024-07-11 ENCOUNTER — Ambulatory Visit: Payer: Self-pay | Admitting: Internal Medicine

## 2024-07-13 ENCOUNTER — Ambulatory Visit: Attending: Internal Medicine

## 2024-07-13 DIAGNOSIS — I5042 Chronic combined systolic (congestive) and diastolic (congestive) heart failure: Secondary | ICD-10-CM

## 2024-07-13 DIAGNOSIS — Z95810 Presence of automatic (implantable) cardiac defibrillator: Secondary | ICD-10-CM | POA: Diagnosis not present

## 2024-07-16 NOTE — Progress Notes (Signed)
 EPIC Encounter for ICM Monitoring  Patient Name: Monica Solis is a 63 y.o. female Date: 07/16/2024 Primary Care Physican: Dottie Norleen PHEBE PONCE, MD Primary Cardiologist: Court Electrophysiologist: Waddell  01/03/2023 Office Weight: 340 lbs 05/28/2023 Weight: 320 lbs (started Mounjaro  in April) 07/03/2023 Weight: 312 lbs 10/25/2023 Weight: 297 lbs 11/29/2023 Weight: 295 lbs 01/08/2024 Weight: 293-295 lbs 02/11/2024 Weight: 295-296 lbs 03/19/2024 Weight: 292-295 lbs 05/06/2024 Weight: 286 lbs 07/16/2024 Weight: 276 lbs   Spoke with patient and heart failure questions reviewed.  Transmission results reviewed.  Pt asymptomatic for fluid accumulation.  Reports feeling well at this time and voices no complaints.      HeartLogic HF Index is 1 suggesting fluid levels are within normal threshold range.     Prescribed:  Hydrochlorothiazide  12.5 mg Take 12.5 mg by mouth daily.     Furosemide  20 mg take 1 tablet (20 mg total) by mouth as needed.  Take 1 tablet for swelling and/or weight gain of 3 lbs overnight and 5 lbs in a week.    Labs: 03/31/2024 Creatinine 0.87, BUN 18, Potassium 4.6, Sodium 143, GFR  07/10/2023 Creatinine 0.95, BUN 17, Potassium 3.9, Sodium 140, GFR 64.71 A complete set of results can be found in Results Review.   Recommendations: No changes and encouraged to call if experiencing any fluid symptoms.   Follow-up plan: ICM clinic phone appointment on 08/17/2024.   91 day device clinic remote transmission 09/30/2024.              EP/Cardiology next office visit:   Recall 02/20/2025 with Aline Door, PA.    08/05/2024 with Dr Waddell.            Copy of ICM check sent to Dr. Waddell.  Remote Monitoring Medically Necessary for Heart Failure Management.  3 Month HeartLogicT Heart Failure Index:    8 Day Data Trend:          Mitzie GORMAN Garner, RN 07/16/2024 4:03 PM

## 2024-08-05 ENCOUNTER — Encounter: Payer: Self-pay | Admitting: Internal Medicine

## 2024-08-05 ENCOUNTER — Other Ambulatory Visit (HOSPITAL_COMMUNITY): Payer: Self-pay

## 2024-08-05 ENCOUNTER — Ambulatory Visit: Attending: Internal Medicine | Admitting: Internal Medicine

## 2024-08-05 VITALS — BP 119/71 | HR 77 | Ht 68.0 in | Wt 276.0 lb

## 2024-08-05 DIAGNOSIS — I5022 Chronic systolic (congestive) heart failure: Secondary | ICD-10-CM | POA: Diagnosis not present

## 2024-08-05 MED ORDER — FLUZONE 0.5 ML IM SUSY
0.5000 mL | PREFILLED_SYRINGE | Freq: Once | INTRAMUSCULAR | 0 refills | Status: AC
  Filled 2024-08-05: qty 0.5, 1d supply, fill #0

## 2024-08-05 NOTE — Patient Instructions (Signed)

## 2024-08-05 NOTE — Progress Notes (Signed)
 HPI [name] returns today for ongoing evaluation and management of her ICD, s/p BiV insertion. The patient feels better after her device was placed and has more energy. At times her bp has been a little low. No chest pain. She has lost 20 lbs over the past 5 months. Her dyspnea continues to improve. Allergies  Allergen Reactions   Cephalexin     Other Reaction(s): Other (See Comments)  Flu like symptoms   Crestor [Rosuvastatin Calcium ] Other (See Comments)    Muscle aches    Nexletol [Bempedoic Acid] Other (See Comments)    Muscle    Other Hives    Shingles vaccine    Metals causes swelling (staples or earrings)   Benadryl [Diphenhydramine Hcl] Hives   Canagliflozin Other (See Comments)    Loss of bladder control Invokana   Metformin Other (See Comments)    Kidney damage    Pravastatin Rash and Other (See Comments)    Muscle ache   Tramadol Rash and Other (See Comments)    nightmares    Victoza  [Liraglutide ] Other (See Comments)    Fainting Fainting Hypotension,      Current Outpatient Medications  Medication Sig Dispense Refill   Blood Glucose Monitoring Suppl (ACCU-CHEK GUIDE) w/Device KIT 1 kit by Does not apply route daily. 1 kit 0   Cholecalciferol  (VITAMIN D3) 2000 UNITS capsule Take 4,000 Units by mouth daily with supper.     esomeprazole (NEXIUM) 40 MG capsule Take 40 mg by mouth daily.  5   fexofenadine (ALLEGRA) 180 MG tablet Take 180 mg by mouth daily as needed for allergies or rhinitis.     furosemide  (LASIX ) 20 MG tablet Take 20 mg by mouth as needed. Take 1 tablet by mouth for swelling and/or weight gain of 3 lbs overnight and 5 lbs in a week.     gabapentin  (NEURONTIN ) 100 MG capsule Take 100 mg by mouth at bedtime. 100mg  Take 2 tablets @ bedtime     glucose blood (ACCU-CHEK GUIDE TEST) test strip 1 each by Other route 3 (three) times daily. Use as instructed 100 each 12   hydrochlorothiazide  (HYDRODIURIL ) 12.5 MG tablet Take 1 tablet  (12.5 mg total) by mouth daily. Take 1 Tablet Daily. 90 tablet 3   levothyroxine  (SYNTHROID ) 88 MCG tablet Take 88 mcg by mouth daily.     losartan  (COZAAR ) 25 MG tablet Take 1 tablet (25 mg total) by mouth daily. 90 tablet 3   magnesium oxide (MAG-OX) 400 (240 Mg) MG tablet Take 400 mg by mouth daily.     mometasone (NASONEX) 50 MCG/ACT nasal spray Place 2 sprays into the nose as needed.     Multiple Vitamin (MULTIVITAMIN) tablet Take 1 tablet by mouth daily. Geritol     tirzepatide  (MOUNJARO ) 7.5 MG/0.5ML Pen Inject 5 mg into the skin once a week. 1 mL 5   XARELTO  20 MG TABS tablet Take 1 tablet (20 mg total) by mouth daily. 90 tablet 3   B-D ULTRAFINE III SHORT PEN 31G X 8 MM MISC Inject into the skin 4 (four) times daily. (Patient not taking: Reported on 03/31/2024)     clobetasol  ointment (TEMOVATE ) 0.05 % Apply 1 Application topically 2 (two) times daily. Use twice a day x 2 weeks, then twice weekly (Patient not taking: Reported on 08/05/2024) 60 g 2   No current facility-administered medications for this visit.   Facility-Administered Medications Ordered in Other Visits  Medication Dose Route Frequency Provider Last Rate  Last Admin   technetium tetrofosmin  (TC-MYOVIEW ) injection 32.4 millicurie  32.4 millicurie Intravenous Once PRN Hilty, Vinie BROCKS, MD         Past Medical History:  Diagnosis Date   AICD (automatic cardioverter/defibrillator) present    BMI 45.0-49.9, adult [facility])    CHF (congestive heart failure) (HCC)    Diverticulosis of colon    Factor 5 Leiden mutation, heterozygous    dx 2011   GERD (gastroesophageal reflux disease)    History of DVT of lower extremity 2000; 2011   I've had them in both   History of ectopic pregnancy 1998   History of pulmonary embolus (PE)    Hyperlipidemia    STATIN INTOLERANT.  Lipid clinic advised.   Hypertension    Hypothyroidism    Left bundle branch block (LBBB)    Morbid obesity (HCC)    Non-ischemic cardiomyopathy (HCC)     f/by Dr. Court of Cards   OSA (obstructive sleep apnea)    Largely resolved.  Much improved after weight loss   Osteoarthritis    Peripheral neuropathy    Vitamin D  deficiency     ROS:   All systems reviewed and negative except as noted in the HPI.   Past Surgical History:  Procedure Laterality Date   BI-VENTRICULAR IMPLANTABLE CARDIOVERTER DEFIBRILLATOR  (CRT-D)  06/26/2017   BIOPSY  05/21/2022   Procedure: BIOPSY;  Surgeon: Eda Iha, MD;  Location: [facility];  Service: Gastroenterology;;  EGD and COLON   BIV ICD INSERTION CRT-D N/A 06/26/2017   Procedure: BIV ICD INSERTION CRT-D;  Surgeon: Waddell Danelle ORN, MD;  Location: [facility] INVASIVE CV LAB;  Service: Cardiovascular;  Laterality: N/A;   CHOLECYSTECTOMY N/A 06/02/2021   COLONOSCOPY  2013   COLONOSCOPY WITH PROPOFOL  N/A 05/21/2022   Procedure: COLONOSCOPY WITH PROPOFOL ;  Surgeon: Eda Iha, MD;  Location: [facility];  Service: Gastroenterology;  Laterality: N/A;   DILATATION & CURETTAGE/HYSTEROSCOPY WITH MYOSURE N/A 10/05/2016   Procedure: DILATATION & CURETTAGE/HYSTEROSCOPY WITH MYOSURE;  Surgeon: Evalene SHAUNNA Organ, MD;  Location: [facility];  Service: Gynecology;  Laterality: N/A;  requests to follow 7:30am caserequests one hour OR time   DILATION AND CURETTAGE OF UTERUS  12/06/2007   ESOPHAGOGASTRODUODENOSCOPY (EGD) WITH PROPOFOL  N/A 05/21/2022   Procedure: ESOPHAGOGASTRODUODENOSCOPY (EGD) WITH PROPOFOL ;  Surgeon: Eda Iha, MD;  Location: [facility];  Service: Gastroenterology;  Laterality: N/A;   LAPAROSCOPY BILATERAL TUBAL LIGATION WITH FALOPE RINGS/  D&C HYSTEROSCOPY WITH POLYPECTOMY  12/06/2007   and Lysis Adhesions   LEFT HEART CATH AND CORONARY ANGIOGRAPHY N/A 01/03/2017   Procedure: Left Heart Cath and Coronary Angiography;  Surgeon: Dorn JINNY Court, MD;  Location: [facility] INVASIVE CV LAB;  Service: Cardiovascular;  Laterality: N/A;   POLYPECTOMY  05/21/2022   Procedure: POLYPECTOMY;   Surgeon: Eda Iha, MD;  Location: [facility];  Service: Gastroenterology;;   TUBAL LIGATION  12/06/2007     Family History  Problem Relation Age of Onset   Hypertension Mother    Heart disease Mother    Alzheimer's disease Mother    Atrial fibrillation Mother    Diabetes Mother    Hyperlipidemia Mother    Stroke Mother    Anxiety disorder Mother    Sleep apnea Mother    Obesity Mother    Heart disease Father    Sudden death Father    Mitral valve prolapse Sister    Breast cancer Maternal Aunt        30's   Alzheimer's disease Maternal  Aunt    Diabetes Maternal Grandmother    Heart disease Maternal Grandmother    Cancer Maternal Grandfather        Unknown type   Heart disease Paternal Grandmother    Heart disease Paternal Grandfather    Colon cancer Neg Hx    Liver disease Neg Hx    Pancreatic cancer Neg Hx    Esophageal cancer Neg Hx    Stomach cancer Neg Hx    Colon polyps Neg Hx    Rectal cancer Neg Hx      Social History   Socioeconomic History   Marital status: Married    Spouse name: Vinie   Number of children: 0   Years of education: Not on file   Highest education level: Not on file  Occupational History   Occupation: home maker  Tobacco Use   Smoking status: Former    Current packs/day: 0.00    Average packs/day: 0.1 packs/day for 2.0 years (0.2 ttl pk-yrs)    Types: Cigarettes    Start date: 10/01/1977    Quit date: 10/02/1979    Years since quitting: 44.8    Passive exposure: Past   Smokeless tobacco: Never  Vaping Use   Vaping status: Never Used  Substance and Sexual Activity   Alcohol use: No    Alcohol/week: 0.0 standard drinks of alcohol   Drug use: No   Sexual activity: Not Currently    Partners: Male    Birth control/protection: Surgical, Post-menopausal    Comment: BTL, First IC >16 y/o, <5 Partners  Other Topics Concern   Not on file  Social History Narrative   Not on file   Social Drivers of Health   Financial  Resource Strain: Patient Declined (03/27/2024)   Overall Financial Resource Strain (CARDIA)    Difficulty of Paying Living Expenses: Patient declined  Food Insecurity: Patient Declined (03/27/2024)   Hunger Vital Sign    Worried About Running Out of Food in the Last Year: Patient declined    Ran Out of Food in the Last Year: Patient declined  Transportation Needs: No Transportation Needs (03/27/2024)   PRAPARE - Administrator, Civil Service (Medical): No    Lack of Transportation (Non-Medical): No  Physical Activity: Unknown (03/27/2024)   Exercise Vital Sign    Days of Exercise per Week: Patient declined    Minutes of Exercise per Session: Not on file  Stress: Patient Declined (03/27/2024)   [employer] of Occupational Health - Occupational Stress Questionnaire    Feeling of Stress: Patient declined  Social Connections: Moderately Isolated (03/27/2024)   Social Connection and Isolation Panel    Frequency of Communication with Friends and Family: More than three times a week    Frequency of Social Gatherings with Friends and Family: Patient declined    Attends Religious Services: Patient declined    Database administrator or Organizations: No    Attends Engineer, structural: Not on file    Marital Status: Married  Catering manager Violence: Not on file     BP 119/71   Pulse 77   Ht 5' 8 (1.727 m)   Wt 276 lb (125.2 kg)   LMP 08/07/2016   SpO2 97%   BMI 41.97 kg/m   Physical Exam:  Well appearing NAD HEENT: Unremarkable Neck:  No JVD, no thyromegally Lymphatics:  No adenopathy Back:  No CVA tenderness Lungs:  Clear HEART:  Regular rate rhythm, no murmurs, no rubs, no clicks Abd:  soft, positive bowel sounds, no organomegally, no rebound, no guarding Ext:  2 plus pulses, no edema, no cyanosis, no clubbing Skin:  No rashes no nodules Neuro:  CN II through XII intact, motor grossly intact  DEVICE  Normal device function.  See PaceArt for  details.   Assess/Plan:  1.Chronic systolic heart failure - she is class 2. She is encouraged to continue to lose weight and increase her physical activity. No change in meds. 2. ICD - her [place] sci biv device is working normally. We will recheck in several months. 3. Obesity - she is now losing weight. Continue.    Danelle Demarius Archila,MD

## 2024-08-09 ENCOUNTER — Encounter (HOSPITAL_BASED_OUTPATIENT_CLINIC_OR_DEPARTMENT_OTHER): Payer: Self-pay

## 2024-08-09 ENCOUNTER — Other Ambulatory Visit (HOSPITAL_BASED_OUTPATIENT_CLINIC_OR_DEPARTMENT_OTHER): Payer: Self-pay | Admitting: Family Medicine

## 2024-08-09 ENCOUNTER — Ambulatory Visit (HOSPITAL_BASED_OUTPATIENT_CLINIC_OR_DEPARTMENT_OTHER)
Admission: EM | Admit: 2024-08-09 | Discharge: 2024-08-09 | Disposition: A | Discharge status: Home / Self Care | Attending: Family Medicine | Admitting: Family Medicine

## 2024-08-09 DIAGNOSIS — H60392 Other infective otitis externa, left ear: Secondary | ICD-10-CM

## 2024-08-09 MED ORDER — CIPROFLOXACIN-DEXAMETHASONE 0.3-0.1 % OT SUSP: 4.0000 [drp] | Freq: Two times a day (BID) | OTIC | 0 refills | Status: AC

## 2024-08-09 MED ORDER — CEPHALEXIN 500 MG PO CAPS: 500.0000 mg | ORAL_CAPSULE | Freq: Two times a day (BID) | ORAL | 0 refills | Status: AC

## 2024-08-09 NOTE — ED Triage Notes (Signed)
 States had some tenderness to left ear yesterday. Woke up with bloody drainage from left ear on her pillow. States has had pain. States currently, it is not bad. Had some left over oral antibiotics. 2 doses of cephalexin.

## 2024-08-09 NOTE — ED Provider Notes (Signed)
 [facility]    CSN: [id number] Arrival date & time: 08/09/24  1237      History   Chief Complaint Chief Complaint  Patient presents with   Otalgia    HPI Monica Solis is a 63 y.o. female.   Patient is a 63 year old female who presents today with left ear pain.  States had some tenderness to left ear yesterday. Woke up with bloody drainage from left ear on her pillow. States has had pain. States currently, it is not bad. Had some left over oral antibiotics. 2 doses of cephalexin.    Otalgia   Past Medical History:  Diagnosis Date   AICD (automatic cardioverter/defibrillator) present    BMI 45.0-49.9, adult (HCC)    CHF (congestive heart failure) (HCC)    Diverticulosis of colon    Factor 5 Leiden mutation, heterozygous    dx 2011   GERD (gastroesophageal reflux disease)    History of DVT of lower extremity 2000; 2011   I've had them in both   History of ectopic pregnancy 1998   History of pulmonary embolus (PE)    Hyperlipidemia    STATIN INTOLERANT.  Lipid clinic advised.   Hypertension    Hypothyroidism    Left bundle branch block (LBBB)    Morbid obesity (HCC)    Non-ischemic cardiomyopathy (HCC)    f/by Dr. Court of Cards   OSA (obstructive sleep apnea)    Largely resolved.  Much improved after weight loss   Osteoarthritis    Peripheral neuropathy    Vitamin D  deficiency     Patient Active Problem List   Diagnosis Date Noted   Type 2 diabetes mellitus with hyperglycemia (HCC) 03/31/2024   Osteopenia 03/31/2024   Nocturnal leg cramps 03/31/2024   Heart failure (HCC) 10/16/2022   Sleep apnea 10/16/2022   Essential (primary) hypertension 10/16/2022   Hypothyroidism 10/16/2022   RUQ pain    Adenomatous polyp of ascending colon    Noninfectious gastroenteritis    Calculus of gallbladder with chronic cholecystitis without obstruction 06/14/2021   Postoperative examination 06/14/2021   Other fatigue 12/27/2020   Shortness of breath on  exertion 12/27/2020   OSA-not on C-pap 04/03/2018   Normal coronary arteries 03/04/2018   Insomnia, 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   Non-ischemic cardiomyopathy (HCC) 09/09/2017   ICD (implantable cardioverter-defibrillator) in place 09/09/2017   Other pulmonary embolism without acute cor pulmonale (HCC) 09/09/2017   Deep vein thrombosis (DVT) of femoral vein of left lower extremity (HCC) 09/09/2017   Chronic systolic heart failure (HCC) 06/26/2017   Chronic combined systolic and diastolic congestive heart failure (HCC) 06/26/2017   Left ventricular dysfunction 12/18/2016   Hyperlipidemia 01/17/2016   Long-term insulin  use (HCC) 01/17/2016   Subclinical hypothyroidism 01/17/2016   Type 2 diabetes mellitus with microalbuminuria, with long-term current use of insulin  (HCC) 01/17/2016   Arthritis of right acromioclavicular joint 06/09/2015   Shoulder impingement 06/09/2015   Acquired hypothyroidism 05/30/2015   Heterozygous factor V Leiden mutation (HCC) 09/27/2014   History of DVT (deep vein thrombosis) 09/27/2014   Microalbuminuria 05/09/2014   Venous embolism and thrombosis of superficial vessels of lower extremity 05/09/2014   Thrombophlebitis of superficial veins of lower extremity 12/15/2013   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   Primary hypercoagulable state 12/15/2013   Diabetes mellitus with microalbuminuria (HCC) 12/14/2013   Allergic rhinitis 12/14/2013   Diverticulosis of colon 12/14/2013   Encounter for counseling 12/14/2013   Esophageal reflux 12/14/2013   Primary hypertension 12/14/2013   Hyperlipidemia LDL goal <  100 12/14/2013   Observation for suspected condition 12/14/2013   Vitamin D  deficiency 12/14/2013   Type 2 diabetes mellitus (HCC) 12/14/2013   Hyperlipidemia, unspecified 12/14/2013   Knee MCL sprain 07/16/2013   Right knee pain 05/07/2013   BBB (bundle branch block)    DVT (deep venous thrombosis) (HCC)    Morbid obesity (HCC)     Past Surgical History:   Procedure Laterality Date   BI-VENTRICULAR IMPLANTABLE CARDIOVERTER DEFIBRILLATOR  (CRT-D)  06/26/2017   BIOPSY  05/21/2022   Procedure: BIOPSY;  Surgeon: Eda Iha, MD;  Location: [facility];  Service: Gastroenterology;;  EGD and COLON   BIV ICD INSERTION CRT-D N/A 06/26/2017   Procedure: BIV ICD INSERTION CRT-D;  Surgeon: Waddell Danelle ORN, MD;  [facility] INVASIVE CV LAB;  Service: Cardiovascular;  Laterality: N/A;   CHOLECYSTECTOMY N/A 06/02/2021   COLONOSCOPY  2013   COLONOSCOPY WITH PROPOFOL  N/A 05/21/2022   Procedure: COLONOSCOPY WITH PROPOFOL ;  Surgeon: Eda Iha, MD;  Location: [facility];  Service: Gastroenterology;  Laterality: N/A;   DILATATION & CURETTAGE/HYSTEROSCOPY WITH MYOSURE N/A 10/05/2016   Procedure: DILATATION & CURETTAGE/HYSTEROSCOPY WITH MYOSURE;  Surgeon: Evalene SHAUNNA Organ, MD;  Location: [facility];  Service: Gynecology;  Laterality: N/A;  requests to follow 7:30am caserequests one hour OR time   DILATION AND CURETTAGE OF UTERUS  12/06/2007   ESOPHAGOGASTRODUODENOSCOPY (EGD) WITH PROPOFOL  N/A 05/21/2022   Procedure: ESOPHAGOGASTRODUODENOSCOPY (EGD) WITH PROPOFOL ;  Surgeon: Eda Iha, MD;  Location: [facility];  Service: Gastroenterology;  Laterality: N/A;   LAPAROSCOPY BILATERAL TUBAL LIGATION WITH FALOPE RINGS/  D&C HYSTEROSCOPY WITH POLYPECTOMY  12/06/2007   and Lysis Adhesions   LEFT HEART CATH AND CORONARY ANGIOGRAPHY N/A 01/03/2017   Procedure: Left Heart Cath and Coronary Angiography;  Surgeon: Dorn JINNY Lesches, MD;  Location: [facility] INVASIVE CV LAB;  Service: Cardiovascular;  Laterality: N/A;   POLYPECTOMY  05/21/2022   Procedure: POLYPECTOMY;  Surgeon: Eda Iha, MD;  Location[facility];  Service: Gastroenterology;;   TUBAL LIGATION  12/06/2007    OB History     Gravida  1   Para      Term      Preterm      AB  1   Living  0      SAB  0   IAB      Ectopic  1   Multiple      Live Births                Home Medications    Prior to Admission medications   Medication Sig Start Date End Date Taking? Authorizing Provider  cephALEXin (KEFLEX) 500 MG capsule Take 1 capsule (500 mg total) by mouth 2 (two) times daily for 4 days. 08/09/24 08/13/24 Yes Carnelia Oscar A, FNP  ciprofloxacin -dexamethasone  (CIPRODEX) OTIC suspension Place 4 drops into the left ear 2 (two) times daily for 7 days. 08/09/24 08/16/24 Yes Dawanna Grauberger A, FNP  B-D ULTRAFINE III SHORT PEN 31G X 8 MM MISC Inject into the skin 4 (four) times daily. Patient not taking: Reported on 03/31/2024 02/17/23   [provider]  Blood Glucose Monitoring Suppl (ACCU-CHEK GUIDE) w/Device KIT 1 kit by Does not apply route daily. 04/24/24   Dottie Norleen PHEBE PONCE, MD  Cholecalciferol  (VITAMIN D3) 2000 UNITS capsule Take 4,000 Units by mouth daily with supper.    [provider]  clobetasol  ointment (TEMOVATE ) 0.05 % Apply 1 Application topically 2 (two) times daily. Use twice a day  x 2 weeks, then twice weekly Patient not taking: Reported on 08/05/2024 12/16/23   Prentiss Riggs A, NP  esomeprazole (NEXIUM) 40 MG capsule Take 40 mg by mouth daily. 04/03/18   [provider]  fexofenadine (ALLEGRA) 180 MG tablet Take 180 mg by mouth daily as needed for allergies or rhinitis.    [provider]  furosemide  (LASIX ) 20 MG tablet Take 20 mg by mouth as needed. Take 1 tablet by mouth for swelling and/or weight gain of 3 lbs overnight and 5 lbs in a week.    [provider]  gabapentin  (NEURONTIN ) 100 MG capsule Take 100 mg by mouth at bedtime. 100mg  Take 2 tablets @ bedtime    [provider]  glucose blood (ACCU-CHEK GUIDE TEST) test strip 1 each by Other route 3 (three) times daily. Use as instructed 04/24/24   Dottie Norleen PHEBE PONCE, MD  hydrochlorothiazide  (HYDRODIURIL ) 12.5 MG tablet Take 1 tablet (12.5 mg total) by mouth daily. Take 1 Tablet Daily. 03/05/23   Goodrich, Callie E, PA-C   levothyroxine  (SYNTHROID ) 88 MCG tablet Take 88 mcg by mouth daily. 05/17/21   [provider]  losartan  (COZAAR ) 25 MG tablet Take 1 tablet (25 mg total) by mouth daily. 01/03/23   Goodrich, Callie E, PA-C  magnesium oxide (MAG-OX) 400 (240 Mg) MG tablet Take 400 mg by mouth daily.    [provider]  mometasone (NASONEX) 50 MCG/ACT nasal spray Place 2 sprays into the nose as needed.    [provider]  Multiple Vitamin (MULTIVITAMIN) tablet Take 1 tablet by mouth daily. Geritol    [provider]  tirzepatide  (MOUNJARO ) 7.5 MG/0.5ML Pen Inject 5 mg into the skin once a week. 05/28/24   Dottie Norleen PHEBE PONCE, MD  XARELTO  20 MG TABS tablet Take 1 tablet (20 mg total) by mouth daily. 12/13/20   Court Dorn PARAS, MD    Family History Family History  Problem Relation Age of Onset   Hypertension Mother    Heart disease Mother    Alzheimer's disease Mother    Atrial fibrillation Mother    Diabetes Mother    Hyperlipidemia Mother    Stroke Mother    Anxiety disorder Mother    Sleep apnea Mother    Obesity Mother    Heart disease Father    Sudden death Father    Mitral valve prolapse Sister    Breast cancer Maternal Aunt        30's   Alzheimer's disease Maternal Aunt    Diabetes Maternal Grandmother    Heart disease Maternal Grandmother    Cancer Maternal Grandfather        Unknown type   Heart disease Paternal Grandmother    Heart disease Paternal Grandfather    Colon cancer Neg Hx    Liver disease Neg Hx    Pancreatic cancer Neg Hx    Esophageal cancer Neg Hx    Stomach cancer Neg Hx    Colon polyps Neg Hx    Rectal cancer Neg Hx     Social History Social History   Tobacco Use   Smoking status: Former    Current packs/day: 0.00    Average packs/day: 0.1 packs/day for 2.0 years (0.2 ttl pk-yrs)    Types: Cigarettes    Start date: 10/01/1977    Quit date: 10/02/1979    Years since quitting: 44.8    Passive exposure: Past   Smokeless  tobacco: Never  Vaping Use   Vaping  status: Never Used  Substance Use Topics   Alcohol use: No    Alcohol/week: 0.0 standard drinks of alcohol   Drug use: No     Allergies   Cephalexin, Crestor [rosuvastatin calcium ], Nexletol [bempedoic acid], Other, Benadryl [diphenhydramine hcl], Canagliflozin, Metformin, Pravastatin, Tramadol, and Victoza  [liraglutide ]   Review of Systems Review of Systems  HENT:  Positive for ear pain.      Physical Exam Triage Vital Signs ED Triage Vitals  Encounter Vitals Group     BP 08/09/24 1300 127/82     Girls Systolic BP Percentile --      Girls Diastolic BP Percentile --      Boys Systolic BP Percentile --      Boys Diastolic BP Percentile --      Pulse Rate 08/09/24 1300 62     Resp 08/09/24 1300 20     Temp 08/09/24 1300 98.4 F (36.9 C)     Temp Source 08/09/24 1300 Oral     SpO2 08/09/24 1300 97 %     Weight --      Height --      Head Circumference --      Peak Flow --      Pain Score 08/09/24 1302 1     Pain Loc --      Pain Education --      Exclude from Growth Chart --    No data found.  Updated Vital Signs BP 127/82 (BP Location: Right Arm)   Pulse 62   Temp 98.4 F (36.9 C) (Oral)   Resp 20   LMP 08/07/2016   SpO2 97%   Visual Acuity Right Eye Distance:   Left Eye Distance:   Bilateral Distance:    Right Eye Near:   Left Eye Near:    Bilateral Near:     Physical Exam Vitals and nursing note reviewed.  Constitutional:      General: She is not in acute distress.    Appearance: Normal appearance. She is not ill-appearing, toxic-appearing or diaphoretic.  HENT:     Head:     Comments: Swelling to left ear canal with erythema and small amount of bloody drainage Unable to fully view TM but appears to be intact    Right Ear: Tympanic membrane, ear canal and external ear normal.  Pulmonary:     Effort: Pulmonary effort is normal.  Neurological:     Mental Status: She is alert.  Psychiatric:        Mood and  Affect: Mood normal.      UC Treatments / Results  Labs (all labs ordered are listed, but only abnormal results are displayed) Labs Reviewed - No data to display  EKG   Radiology No results found.  Procedures Procedures (including critical care time)  Medications Ordered in UC Medications - No data to display  Initial Impression / Assessment and Plan / UC Course  I have reviewed the triage vital signs and the nursing notes.  Pertinent labs & imaging results that were available during my care of the patient were reviewed by me and considered in my medical decision making (see chart for details).     Otitis externa-treating with Ciprodex at this time.  Refilled Keflex to take over the next 4 days.  This will be a total of 5 days course.  Recommend Tylenol  and Motrin for pain as needed.  Follow-up as needed Final Clinical Impressions(s) / UC Diagnoses   Final diagnoses:  None  Discharge Instructions      Treating you for an ear infection.  Take the medications as prescribed.  Follow-up as needed    ED Prescriptions     Medication Sig Dispense Auth. Provider   ciprofloxacin -dexamethasone  (CIPRODEX) OTIC suspension Place 4 drops into the left ear 2 (two) times daily for 7 days. 2.8 mL Malani Lees A, FNP   cephALEXin (KEFLEX) 500 MG capsule Take 1 capsule (500 mg total) by mouth 2 (two) times daily for 4 days. 8 capsule Adah Corning A, FNP      PDMP not reviewed this encounter.   Adah Corning LABOR, FNP 08/09/24 1549

## 2024-08-09 NOTE — Discharge Instructions (Signed)
 Treating you for an ear infection.  Take the medications as prescribed.  Follow-up as needed

## 2024-08-13 NOTE — Telephone Encounter (Signed)
 Copied from CRM #[id number]. Topic: Clinical - Medication Refill >> Aug 13, 2024 10:17 AM Darshell M wrote: Medication:  losartan  (COZAAR ) 25 MG tablet gabapentin  (NEURONTIN ) 100 MG capsule levothyroxine  (SYNTHROID ) 88 MCG tablet esomeprazole (NEXIUM) 40 MG capsule  Has the patient contacted their pharmacy? Yes (Agent: If no, request that the patient contact the pharmacy for the refill. If patient does not wish to contact the pharmacy document the reason why and proceed with request.) (Agent: If yes, when and what did the pharmacy advise?)  This is the patient's preferred pharmacy:  [id number] - [place], [place] - [address] AT [facility] [address] [place] [address] Phone: [phone] Fax: [phone]  Is this the correct pharmacy for this prescription? Yes If no, delete pharmacy and type the correct one.   Has the prescription been filled recently? No  Is the patient out of the medication? Yes  Has the patient been seen for an appointment in the last year OR does the patient have an upcoming appointment? Yes  Can we respond through MyChart? Yes  Agent: Please be advised that Rx refills may take up to 3 business days. We ask that you follow-up with your pharmacy.

## 2024-08-17 ENCOUNTER — Ambulatory Visit: Attending: Internal Medicine

## 2024-08-17 DIAGNOSIS — I5022 Chronic systolic (congestive) heart failure: Secondary | ICD-10-CM | POA: Diagnosis not present

## 2024-08-17 DIAGNOSIS — Z95810 Presence of automatic (implantable) cardiac defibrillator: Secondary | ICD-10-CM | POA: Diagnosis not present

## 2024-08-18 NOTE — Progress Notes (Signed)
 EPIC Encounter for ICM Monitoring  Patient Name: Monica Solis is a 63 y.o. female Date: 08/18/2024 Primary Care Physican: Dottie Norleen PHEBE PONCE, MD Primary Cardiologist: Court Electrophysiologist: Waddell  01/03/2023 Office Weight: 340 lbs 05/28/2023 Weight: 320 lbs (started Mounjaro  in April) 07/03/2023 Weight: 312 lbs 10/25/2023 Weight: 297 lbs 11/29/2023 Weight: 295 lbs 01/08/2024 Weight: 293-295 lbs 02/11/2024 Weight: 295-296 lbs 03/19/2024 Weight: 292-295 lbs 05/06/2024 Weight: 286 lbs 07/16/2024 Weight: 276 lbs   Attempted call to patient and unable to reach.   Transmission results reviewed.       HeartLogic HF Index is 3 suggesting fluid levels are within normal threshold range.     Prescribed:  Hydrochlorothiazide  12.5 mg Take 12.5 mg by mouth daily.     Furosemide  20 mg take 1 tablet (20 mg total) by mouth as needed.  Take 1 tablet for swelling and/or weight gain of 3 lbs overnight and 5 lbs in a week.    Labs: 03/31/2024 Creatinine 0.87, BUN 18, Potassium 4.6, Sodium 143, GFR  07/10/2023 Creatinine 0.95, BUN 17, Potassium 3.9, Sodium 140, GFR 64.71 A complete set of results can be found in Results Review.   Recommendations: Unable to reach.     Follow-up plan: ICM clinic phone appointment on 09/28/2024.   91 day device clinic remote transmission 09/30/2024.              EP/Cardiology next office visit:   Recall 02/20/2025 with Aline Door, PA.  Recall 07/31/2025 with Dr Almetta.          Copy of ICM check sent to Dr. Waddell.  Remote Monitoring Medically Necessary for Heart Failure Management.  3 Month HeartLogicT Heart Failure Index:    8 Day Data Trend:          Mitzie GORMAN Garner, RN 08/18/2024 4:25 PM

## 2024-08-19 ENCOUNTER — Encounter (HOSPITAL_BASED_OUTPATIENT_CLINIC_OR_DEPARTMENT_OTHER): Payer: Self-pay | Admitting: Family Medicine

## 2024-08-19 ENCOUNTER — Ambulatory Visit (INDEPENDENT_AMBULATORY_CARE_PROVIDER_SITE_OTHER): Admitting: Family Medicine

## 2024-08-19 ENCOUNTER — Other Ambulatory Visit (HOSPITAL_BASED_OUTPATIENT_CLINIC_OR_DEPARTMENT_OTHER): Payer: Self-pay | Admitting: Family Medicine

## 2024-08-19 VITALS — BP 108/75 | HR 81 | Temp 97.5°F | Resp 16 | Wt 283.1 lb

## 2024-08-19 DIAGNOSIS — E039 Hypothyroidism, unspecified: Secondary | ICD-10-CM

## 2024-08-19 DIAGNOSIS — G629 Polyneuropathy, unspecified: Secondary | ICD-10-CM | POA: Insufficient documentation

## 2024-08-19 DIAGNOSIS — I10 Essential (primary) hypertension: Secondary | ICD-10-CM

## 2024-08-19 DIAGNOSIS — K219 Gastro-esophageal reflux disease without esophagitis: Secondary | ICD-10-CM | POA: Diagnosis not present

## 2024-08-19 DIAGNOSIS — E7439 Other disorders of intestinal carbohydrate absorption: Secondary | ICD-10-CM

## 2024-08-19 DIAGNOSIS — Z86711 Personal history of pulmonary embolism: Secondary | ICD-10-CM

## 2024-08-19 DIAGNOSIS — E785 Hyperlipidemia, unspecified: Secondary | ICD-10-CM

## 2024-08-19 DIAGNOSIS — E1140 Type 2 diabetes mellitus with diabetic neuropathy, unspecified: Secondary | ICD-10-CM

## 2024-08-19 MED ORDER — XARELTO 20 MG PO TABS: 20.0000 mg | ORAL_TABLET | Freq: Every day | ORAL | 3 refills | Status: AC

## 2024-08-19 MED ORDER — GABAPENTIN 100 MG PO CAPS: 100.0000 mg | ORAL_CAPSULE | Freq: Every day | ORAL | 1 refills | Status: DC

## 2024-08-19 MED ORDER — LOSARTAN POTASSIUM 25 MG PO TABS: 25.0000 mg | ORAL_TABLET | Freq: Every day | ORAL | 1 refills | Status: AC

## 2024-08-19 MED ORDER — ESOMEPRAZOLE MAGNESIUM 40 MG PO CPDR: 40.0000 mg | DELAYED_RELEASE_CAPSULE | Freq: Every day | ORAL | 3 refills | Status: AC

## 2024-08-19 MED ORDER — LEVOTHYROXINE SODIUM 88 MCG PO TABS: 88.0000 ug | ORAL_TABLET | Freq: Every day | ORAL | 2 refills | Status: AC

## 2024-08-19 NOTE — Assessment & Plan Note (Signed)
 Satisfactory control.  Continue weight loss efforts.

## 2024-08-19 NOTE — Progress Notes (Signed)
 Established Patient Office Visit  Subjective   Patient ID: Monica Solis, female    DOB: 01-Oct-1961  Age: 63 y.o. MRN: [id number]  Chief Complaint  Patient presents with   discuss medications     Discuss medications     F/u as above.  Her recent Left hear infection is much improved.  Ongoing struggles with weight loss.  Extended review of her many issues today.  Happy to provide refills and reevaluate a few labs.  Extended discussion.    Past Medical History:  Diagnosis Date   AICD (automatic cardioverter/defibrillator) present    CHF (congestive heart failure) (HCC)    Diverticulosis of colon    Factor 5 Leiden mutation, heterozygous    dx 2011   GERD (gastroesophageal reflux disease)    History of DVT of lower extremity 2000; 2011   I've had them in both   History of ectopic pregnancy 1998   History of pulmonary embolus (PE)    Hyperlipidemia    STATIN INTOLERANT.  Lipid clinic advised.   Hypertension    Hypothyroidism    Left bundle branch block (LBBB)    Morbid obesity (HCC)    Non-ischemic cardiomyopathy (HCC)    f/by Dr. Court of Cards   OSA (obstructive sleep apnea)    Largely resolved.  Much improved after weight loss   Osteoarthritis    Osteopenia    Mild   Peripheral neuropathy    Vitamin D  deficiency     Outpatient Encounter Medications as of 08/19/2024  Medication Sig   Blood Glucose Monitoring Suppl (ACCU-CHEK GUIDE) w/Device KIT 1 kit by Does not apply route daily.   Cholecalciferol  (VITAMIN D3) 2000 UNITS capsule Take 4,000 Units by mouth daily with supper.   fexofenadine (ALLEGRA) 180 MG tablet Take 180 mg by mouth daily as needed for allergies or rhinitis.   furosemide  (LASIX ) 20 MG tablet Take 20 mg by mouth as needed. Take 1 tablet by mouth for swelling and/or weight gain of 3 lbs overnight and 5 lbs in a week.   glucose blood (ACCU-CHEK GUIDE TEST) test strip 1 each by Other route 3 (three) times daily. Use as instructed    hydrochlorothiazide  (HYDRODIURIL ) 12.5 MG tablet Take 1 tablet (12.5 mg total) by mouth daily. Take 1 Tablet Daily.   magnesium oxide (MAG-OX) 400 (240 Mg) MG tablet Take 400 mg by mouth daily.   mometasone (NASONEX) 50 MCG/ACT nasal spray Place 2 sprays into the nose as needed.   Multiple Vitamin (MULTIVITAMIN) tablet Take 1 tablet by mouth daily. Geritol   tirzepatide  (MOUNJARO ) 7.5 MG/0.5ML Pen Inject 5 mg into the skin once a week.   [DISCONTINUED] esomeprazole (NEXIUM) 40 MG capsule Take 40 mg by mouth daily.   [DISCONTINUED] gabapentin  (NEURONTIN ) 100 MG capsule Take 100 mg by mouth at bedtime. 100mg  Take 2 tablets @ bedtime   [DISCONTINUED] levothyroxine  (SYNTHROID ) 88 MCG tablet Take 88 mcg by mouth daily.   [DISCONTINUED] losartan  (COZAAR ) 25 MG tablet Take 1 tablet (25 mg total) by mouth daily.   [DISCONTINUED] XARELTO  20 MG TABS tablet Take 1 tablet (20 mg total) by mouth daily.   clobetasol  ointment (TEMOVATE ) 0.05 % Apply 1 Application topically 2 (two) times daily. Use twice a day x 2 weeks, then twice weekly (Patient not taking: Reported on 08/19/2024)   esomeprazole (NEXIUM) 40 MG capsule Take 1 capsule (40 mg total) by mouth daily.   gabapentin  (NEURONTIN ) 100 MG capsule Take 1 capsule (100 mg total) by mouth at  bedtime. 100mg  Take 2 tablets @ bedtime   levothyroxine  (SYNTHROID ) 88 MCG tablet Take 1 tablet (88 mcg total) by mouth daily.   losartan  (COZAAR ) 25 MG tablet Take 1 tablet (25 mg total) by mouth daily.   XARELTO  20 MG TABS tablet Take 1 tablet (20 mg total) by mouth daily.   [DISCONTINUED] B-D ULTRAFINE III SHORT PEN 31G X 8 MM MISC Inject into the skin 4 (four) times daily. (Patient not taking: Reported on 03/31/2024)   Facility-Administered Encounter Medications as of 08/19/2024  Medication   technetium tetrofosmin  (TC-MYOVIEW ) injection 32.4 millicurie    Social History   Tobacco Use   Smoking status: Former    Current packs/day: 0.00    Average packs/day: 0.1  packs/day for 2.0 years (0.2 ttl pk-yrs)    Types: Cigarettes    Start date: 10/01/1977    Quit date: 10/02/1979    Years since quitting: 44.9    Passive exposure: Past   Smokeless tobacco: Never  Vaping Use   Vaping status: Never Used  Substance Use Topics   Alcohol use: No    Alcohol/week: 0.0 standard drinks of alcohol   Drug use: No      Review of Systems  Constitutional:  Positive for malaise/fatigue. Negative for diaphoresis, fever and weight loss.  Respiratory:  Negative for cough, shortness of breath and wheezing.   Cardiovascular:  Positive for leg swelling. Negative for chest pain, palpitations, orthopnea, claudication and PND.      Objective:     BP 108/75 (Cuff Size: Normal)   Pulse 81   Temp (!) 97.5 F (36.4 C) (Oral)   Resp 16   Wt 283 lb 1.6 oz (128.4 kg)   LMP 08/07/2016   SpO2 96%   BMI 43.05 kg/m    Physical Exam Constitutional:      General: She is not in acute distress.    Appearance: Normal appearance. She is obese.  HENT:     Head: Normocephalic.  Neck:     Vascular: No carotid bruit.     Comments: Thyroid  normal to palpation. Cardiovascular:     Rate and Rhythm: Normal rate and regular rhythm.     Pulses: Normal pulses.     Heart sounds: Normal heart sounds.  Pulmonary:     Effort: Pulmonary effort is normal.     Breath sounds: Normal breath sounds.  Abdominal:     General: Bowel sounds are normal.     Palpations: Abdomen is soft.  Musculoskeletal:     Cervical back: Neck supple. No tenderness.     Right lower leg: No edema.     Left lower leg: No edema.  Neurological:     Mental Status: She is alert.      No results found for any visits on 08/19/24.    The 10-year ASCVD risk score (Arnett DK, et al., 2019) is: 8.3%    Assessment & Plan:  Gastroesophageal reflux disease, unspecified whether esophagitis present -     Esomeprazole Magnesium; Take 1 capsule (40 mg total) by mouth daily.  Dispense: 90 capsule; Refill:  3  Acquired hypothyroidism -     Levothyroxine  Sodium; Take 1 tablet (88 mcg total) by mouth daily.  Dispense: 90 tablet; Refill: 2  Primary hypertension Assessment & Plan: Satisfactory control.  Continue weight loss efforts.  Orders: -     Losartan  Potassium; Take 1 tablet (25 mg total) by mouth daily.  Dispense: 90 tablet; Refill: 1 -     CBC  with Differential/Platelet  History of pulmonary embolism -     Xarelto ; Take 1 tablet (20 mg total) by mouth daily.  Dispense: 90 tablet; Refill: 3  Peripheral polyneuropathy -     Gabapentin ; Take 1 capsule (100 mg total) by mouth at bedtime. 100mg  Take 2 tablets @ bedtime  Dispense: 90 capsule; Refill: 1 -     Vitamin B12  Glucose intolerance -     Hemoglobin A1c  Dyslipidemia Assessment & Plan: Await lipid panel and remind her of her treatment choices.  Naturally hope she'll be more agreeable to something like Repatha given her risks.  Orders: -     Lipid panel    Return in about 6 months (around 02/16/2025) for chronic follow-up.    REDDING PONCE NORLEEN FALCON., MD

## 2024-08-19 NOTE — Assessment & Plan Note (Signed)
 Await lipid panel and remind her of her treatment choices.  Naturally hope she'll be more agreeable to something like Repatha given her risks.

## 2024-08-20 ENCOUNTER — Ambulatory Visit (HOSPITAL_BASED_OUTPATIENT_CLINIC_OR_DEPARTMENT_OTHER): Payer: Self-pay | Admitting: Family Medicine

## 2024-08-20 LAB — CBC WITH DIFFERENTIAL/PLATELET
Basophils Absolute: 0 x10E3/uL (ref 0.0–0.2)
Basos: 1 %
EOS (ABSOLUTE): 0.1 x10E3/uL (ref 0.0–0.4)
Eos: 2 %
Hematocrit: 48.7 % — ABNORMAL HIGH (ref 34.0–46.6)
Hemoglobin: 16 g/dL — ABNORMAL HIGH (ref 11.1–15.9)
Immature Grans (Abs): 0 x10E3/uL (ref 0.0–0.1)
Immature Granulocytes: 0 %
Lymphocytes Absolute: 1.4 x10E3/uL (ref 0.7–3.1)
Lymphs: 25 %
MCH: 30.6 pg (ref 26.6–33.0)
MCHC: 32.9 g/dL (ref 31.5–35.7)
MCV: 93 fL (ref 79–97)
Monocytes Absolute: 0.5 x10E3/uL (ref 0.1–0.9)
Monocytes: 10 %
Neutrophils Absolute: 3.4 x10E3/uL (ref 1.4–7.0)
Neutrophils: 62 %
Platelets: 235 x10E3/uL (ref 150–450)
RBC: 5.23 x10E6/uL (ref 3.77–5.28)
RDW: 12.5 % (ref 11.7–15.4)
WBC: 5.5 x10E3/uL (ref 3.4–10.8)

## 2024-08-20 LAB — LIPID PANEL
Chol/HDL Ratio: 4.2 ratio (ref 0.0–4.4)
Cholesterol, Total: 199 mg/dL (ref 100–199)
HDL: 47 mg/dL (ref >39)
LDL Chol Calc (NIH): 122 mg/dL — ABNORMAL HIGH (ref 0–99)
Triglycerides: 167 mg/dL — ABNORMAL HIGH (ref 0–149)
VLDL Cholesterol Cal: 30 mg/dL (ref 5–40)

## 2024-08-20 LAB — HEMOGLOBIN A1C
Est. average glucose Bld gHb Est-mCnc: 111 mg/dL
Hgb A1c MFr Bld: 5.5 % (ref 4.8–5.6)

## 2024-08-20 LAB — VITAMIN B12: Vitamin B-12: 573 pg/mL (ref 232–1245)

## 2024-08-30 ENCOUNTER — Encounter (HOSPITAL_BASED_OUTPATIENT_CLINIC_OR_DEPARTMENT_OTHER): Payer: Self-pay | Admitting: Family Medicine

## 2024-09-24 ENCOUNTER — Other Ambulatory Visit (HOSPITAL_BASED_OUTPATIENT_CLINIC_OR_DEPARTMENT_OTHER): Payer: Self-pay | Admitting: Family Medicine

## 2024-09-24 DIAGNOSIS — G629 Polyneuropathy, unspecified: Secondary | ICD-10-CM

## 2024-09-24 MED ORDER — GABAPENTIN 100 MG PO CAPS: 100.0000 mg | ORAL_CAPSULE | Freq: Every day | ORAL | 1 refills | Status: DC

## 2024-09-28 ENCOUNTER — Ambulatory Visit: Attending: Internal Medicine

## 2024-09-30 ENCOUNTER — Ambulatory Visit: Payer: 59

## 2024-09-30 DIAGNOSIS — Z95810 Presence of automatic (implantable) cardiac defibrillator: Secondary | ICD-10-CM | POA: Diagnosis not present

## 2024-09-30 DIAGNOSIS — I5022 Chronic systolic (congestive) heart failure: Secondary | ICD-10-CM | POA: Diagnosis not present

## 2024-09-30 NOTE — Progress Notes (Signed)
 EPIC Encounter for ICM Monitoring  Patient Name: Monica Solis is a 63 y.o. female Date: 09/30/2024 Primary Care Physican: Dottie Norleen PHEBE PONCE, MD Primary Cardiologist: Court Electrophysiologist: Waddell  01/03/2023 Office Weight: 340 lbs 05/28/2023 Weight: 320 lbs (started Mounjaro  in April) 07/03/2023 Weight: 312 lbs 10/25/2023 Weight: 297 lbs 11/29/2023 Weight: 295 lbs 01/08/2024 Weight: 293-295 lbs 02/11/2024 Weight: 295-296 lbs 03/19/2024 Weight: 292-295 lbs 05/06/2024 Weight: 286 lbs 07/16/2024 Weight: 276 lbs 09/30/2024 Weight: 275 lbs   Spoke with patient and heart failure questions reviewed.  Transmission results reviewed.  Pt asymptomatic for fluid accumulation.  Reports feeling well at this time and voices no complaints.      HeartLogic HF Index is 1 suggesting fluid levels are within normal threshold range.     Prescribed:  Hydrochlorothiazide  12.5 mg Take 12.5 mg by mouth daily.     Furosemide  20 mg take 1 tablet (20 mg total) by mouth as needed.  Take 1 tablet for swelling and/or weight gain of 3 lbs overnight and 5 lbs in a week.    Labs: 03/31/2024 Creatinine 0.87, BUN 18, Potassium 4.6, Sodium 143, GFR  07/10/2023 Creatinine 0.95, BUN 17, Potassium 3.9, Sodium 140, GFR 64.71 A complete set of results can be found in Results Review.   Recommendations:   No changes and encouraged to call if experiencing any fluid symptoms.   Follow-up plan: ICM clinic phone appointment on 11/02/2024.   91 day device clinic remote transmission 12/30/2024.              EP/Cardiology next office visit:   Recall 02/20/2025 with Aline Door, PA.   Recall 07/31/2025 with Dr Almetta.           Copy of ICM check sent to Dr. Waddell.  Remote Monitoring Medically Necessary for Heart Failure Management.  3 Month HeartLogic Heart Failure Index:     8 Day Data Trend:          Mitzie GORMAN Garner, RN 09/30/2024 3:47 PM

## 2024-10-01 LAB — CUP PACEART REMOTE DEVICE CHECK
Battery Remaining Longevity: 66 mo
Battery Remaining Percentage: 74 %
Brady Statistic RA Percent Paced: 0 %
Brady Statistic RV Percent Paced: 1 %
Date Time Interrogation Session: 20251217005100
HighPow Impedance: 62 Ohm
Implantable Lead Connection Status: 753985
Implantable Lead Connection Status: 753985
Implantable Lead Connection Status: 753985
Implantable Lead Implant Date: 20180912
Implantable Lead Implant Date: 20180912
Implantable Lead Implant Date: 20180912
Implantable Lead Location: 753858
Implantable Lead Location: 753859
Implantable Lead Location: 753860
Implantable Lead Model: 293
Implantable Lead Model: 4674
Implantable Lead Model: 7741
Implantable Lead Serial Number: 435131
Implantable Lead Serial Number: 813037
Implantable Lead Serial Number: 920623
Implantable Pulse Generator Implant Date: 20180912
Lead Channel Impedance Value: 481 Ohm
Lead Channel Impedance Value: 680 Ohm
Lead Channel Impedance Value: 727 Ohm
Lead Channel Setting Pacing Amplitude: 2 V
Lead Channel Setting Pacing Amplitude: 2.5 V
Lead Channel Setting Pacing Amplitude: 2.5 V
Lead Channel Setting Pacing Pulse Width: 0.4 ms
Lead Channel Setting Pacing Pulse Width: 0.4 ms
Lead Channel Setting Sensing Sensitivity: 0.5 mV
Lead Channel Setting Sensing Sensitivity: 1 mV
Pulse Gen Serial Number: 193993
Zone Setting Status: 755011

## 2024-10-01 NOTE — Progress Notes (Signed)
 Remote ICD Transmission

## 2024-10-11 ENCOUNTER — Ambulatory Visit: Payer: Self-pay | Admitting: Internal Medicine

## 2024-10-16 ENCOUNTER — Ambulatory Visit (HOSPITAL_BASED_OUTPATIENT_CLINIC_OR_DEPARTMENT_OTHER): Admitting: Family Medicine

## 2024-11-02 ENCOUNTER — Ambulatory Visit: Attending: Cardiology

## 2024-11-02 DIAGNOSIS — I5022 Chronic systolic (congestive) heart failure: Secondary | ICD-10-CM | POA: Diagnosis not present

## 2024-11-02 DIAGNOSIS — Z95810 Presence of automatic (implantable) cardiac defibrillator: Secondary | ICD-10-CM | POA: Diagnosis not present

## 2024-11-05 NOTE — Progress Notes (Signed)
 EPIC Encounter for ICM Monitoring  Patient Name: Monica Solis is a 64 y.o. female Date: 11/05/2024 Primary Care Physican: Dottie Norleen PHEBE PONCE, MD Primary Cardiologist: Court Electrophysiologist: Kennyth 01/03/2023 Office Weight: 340 lbs 05/28/2023 Weight: 320 lbs (started Mounjaro  in April) 07/03/2023 Weight: 312 lbs 10/25/2023 Weight: 297 lbs 11/29/2023 Weight: 295 lbs 01/08/2024 Weight: 293-295 lbs 02/11/2024 Weight: 295-296 lbs 03/19/2024 Weight: 292-295 lbs 05/06/2024 Weight: 286 lbs 07/16/2024 Weight: 276 lbs 09/30/2024 Weight: 275 lbs   Spoke with patient and heart failure questions reviewed.  Transmission results reviewed.  Pt asymptomatic for fluid accumulation.  Reports feeling well at this time and voices no complaints.        HeartLogic HF Index is 0 suggesting fluid levels are within normal threshold range.     Prescribed:  Hydrochlorothiazide  12.5 mg Take 12.5 mg by mouth daily.     Furosemide  20 mg take 1 tablet (20 mg total) by mouth as needed.  Take 1 tablet for swelling and/or weight gain of 3 lbs overnight and 5 lbs in a week.    Labs: 03/31/2024 Creatinine 0.87, BUN 18, Potassium 4.6, Sodium 143, GFR  07/10/2023 Creatinine 0.95, BUN 17, Potassium 3.9, Sodium 140, GFR 64.71 A complete set of results can be found in Results Review.   Recommendations:   No changes and encouraged to call if experiencing any fluid symptoms.   Follow-up plan: ICM clinic phone appointment on 12/07/2024.   91 day device clinic remote transmission 12/30/2024.              EP/Cardiology next office visit:   02/25/2025 with Callie Goodrich, PA.   Recall 07/31/2025 with Dr Almetta.           Copy of ICM check sent to Dr. Kennyth.    Remote Monitoring Medically Necessary for Heart Failure Management.  3 Month HeartLogic Heart Failure Index:    8 Day Data Trend:          Mitzie GORMAN Garner, RN 11/05/2024 8:17 AM

## 2024-11-11 ENCOUNTER — Ambulatory Visit: Payer: 59 | Admitting: Nurse Practitioner

## 2024-11-12 ENCOUNTER — Telehealth (HOSPITAL_BASED_OUTPATIENT_CLINIC_OR_DEPARTMENT_OTHER): Payer: Self-pay | Admitting: Family Medicine

## 2024-11-12 ENCOUNTER — Encounter (HOSPITAL_BASED_OUTPATIENT_CLINIC_OR_DEPARTMENT_OTHER): Payer: Self-pay | Admitting: Family Medicine

## 2024-11-12 DIAGNOSIS — G629 Polyneuropathy, unspecified: Secondary | ICD-10-CM

## 2024-11-12 MED ORDER — GABAPENTIN 100 MG PO CAPS: 100.0000 mg | ORAL_CAPSULE | Freq: Every day | ORAL | 1 refills | Status: DC

## 2024-11-12 NOTE — Progress Notes (Signed)
 31 day ICM Remote transmission canceled due to [facility] clinic is on hold until further notice.  91 day remote monitoring will continue per protocol.

## 2024-11-12 NOTE — Telephone Encounter (Signed)
 New Rx sent with clarification of instructions per Dr. Dottie.

## 2024-11-12 NOTE — Telephone Encounter (Signed)
 Fax received from pt's pharmacy needing clarification on instructions for pt's gabapentin . Instructions show take 1 capsule (100mg ) by mouth at bedtime but then states take 2 tablets at bedtime.  Dr. Dottie, please advise on this.

## 2024-11-13 ENCOUNTER — Other Ambulatory Visit (HOSPITAL_BASED_OUTPATIENT_CLINIC_OR_DEPARTMENT_OTHER): Payer: Self-pay | Admitting: Family Medicine

## 2024-11-13 DIAGNOSIS — E1159 Type 2 diabetes mellitus with other circulatory complications: Secondary | ICD-10-CM

## 2024-11-13 DIAGNOSIS — G629 Polyneuropathy, unspecified: Secondary | ICD-10-CM

## 2024-11-13 MED ORDER — GABAPENTIN 100 MG PO CAPS: 200.0000 mg | ORAL_CAPSULE | Freq: Every day | ORAL | 1 refills | Status: AC

## 2024-11-13 MED ORDER — TIRZEPATIDE 7.5 MG/0.5ML ~~LOC~~ SOAJ: 7.5000 mg | SUBCUTANEOUS | 0 refills | Status: AC

## 2024-11-18 ENCOUNTER — Telehealth (HOSPITAL_BASED_OUTPATIENT_CLINIC_OR_DEPARTMENT_OTHER): Payer: Self-pay | Admitting: Family Medicine

## 2024-11-18 NOTE — Telephone Encounter (Signed)
 Copied from CRM #[id number]. Topic: Clinical - Prescription Issue >> Nov 18, 2024 11:28 AM Deaijah H wrote: Reason for CRM:  [facility]  instructions incorrect set for 1 cap instead of 2 cap and stated she would like Mounjaro  7.5 for 90 days instead of 84. Did advise [facility]apentin  [facility]/90 days stated she will follow back up with pharmacy

## 2024-11-23 ENCOUNTER — Ambulatory Visit

## 2024-12-07 ENCOUNTER — Ambulatory Visit

## 2025-01-06 ENCOUNTER — Ambulatory Visit: Admitting: Nurse Practitioner

## 2025-02-16 ENCOUNTER — Ambulatory Visit (HOSPITAL_BASED_OUTPATIENT_CLINIC_OR_DEPARTMENT_OTHER): Admitting: Family Medicine

## 2025-02-25 ENCOUNTER — Ambulatory Visit: Admitting: Student
